# Patient Record
Sex: Female | Born: 1995 | Race: Black or African American | Hispanic: No | Marital: Single | Smoking: Never smoker
Health system: Southern US, Community
[De-identification: ages and names within clinical notes are randomized; demographics above are authoritative.]

## PROBLEM LIST (undated history)

## (undated) DIAGNOSIS — T7840XA Allergy, unspecified, initial encounter: Secondary | ICD-10-CM

## (undated) DIAGNOSIS — I10 Essential (primary) hypertension: Secondary | ICD-10-CM

## (undated) DIAGNOSIS — F419 Anxiety disorder, unspecified: Secondary | ICD-10-CM

## (undated) DIAGNOSIS — J40 Bronchitis, not specified as acute or chronic: Secondary | ICD-10-CM

## (undated) DIAGNOSIS — K219 Gastro-esophageal reflux disease without esophagitis: Secondary | ICD-10-CM

## (undated) HISTORY — DX: Gastro-esophageal reflux disease without esophagitis: K21.9

## (undated) HISTORY — PX: OTHER SURGICAL HISTORY: SHX169

---
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
## 2013-01-04 ENCOUNTER — Other Ambulatory Visit: Payer: Self-pay

## 2013-01-04 MED ORDER — ALBUTEROL SULFATE HFA 108 (90 BASE) MCG/ACT IN AERS: 2.0000 | INHALATION_SPRAY | Freq: Four times a day (QID) | RESPIRATORY_TRACT | Status: DC | PRN

## 2013-02-03 ENCOUNTER — Telehealth: Payer: Self-pay | Admitting: General Practice

## 2013-02-03 NOTE — Telephone Encounter (Signed)
Pt wants her referral to ortho changed to an [place] office. It is too far for her to go to [facility].

## 2013-02-07 ENCOUNTER — Telehealth: Payer: Self-pay | Admitting: General Practice

## 2013-02-14 NOTE — Telephone Encounter (Signed)
Needs a referral put in workqueue

## 2013-02-15 ENCOUNTER — Other Ambulatory Visit: Payer: Self-pay | Admitting: General Practice

## 2013-02-16 NOTE — Telephone Encounter (Signed)
Spoke with patient and she reports being seen at the foot clinic. No needs at this time.

## 2013-11-13 ENCOUNTER — Other Ambulatory Visit: Payer: Self-pay

## 2013-11-16 ENCOUNTER — Other Ambulatory Visit: Payer: Self-pay

## 2014-02-20 ENCOUNTER — Other Ambulatory Visit: Payer: Self-pay | Admitting: General Practice

## 2014-02-21 NOTE — Telephone Encounter (Signed)
Patient of Amy Nichols. Last OV 5-14. Please advise on refill

## 2014-02-22 NOTE — Telephone Encounter (Signed)
Refill denied- told at last visit [facility]

## 2014-02-23 ENCOUNTER — Ambulatory Visit: Payer: Medicaid Other | Admitting: Nurse Practitioner

## 2014-06-14 ENCOUNTER — Ambulatory Visit (INDEPENDENT_AMBULATORY_CARE_PROVIDER_SITE_OTHER): Payer: 59 | Admitting: Family Medicine

## 2014-06-14 ENCOUNTER — Encounter: Payer: Self-pay | Admitting: Family Medicine

## 2014-06-14 VITALS — BP 118/71 | HR 83 | Temp 97.7°F | Ht 66.0 in | Wt 260.0 lb

## 2014-06-14 DIAGNOSIS — R51 Headache: Secondary | ICD-10-CM

## 2014-06-14 DIAGNOSIS — Z0000 Encounter for general adult medical examination without abnormal findings: Secondary | ICD-10-CM

## 2014-06-14 DIAGNOSIS — G4730 Sleep apnea, unspecified: Secondary | ICD-10-CM

## 2014-06-14 DIAGNOSIS — L700 Acne vulgaris: Secondary | ICD-10-CM

## 2014-06-14 DIAGNOSIS — R519 Headache, unspecified: Secondary | ICD-10-CM

## 2014-06-14 DIAGNOSIS — G478 Other sleep disorders: Secondary | ICD-10-CM

## 2014-06-14 LAB — POCT UA - MICROSCOPIC ONLY
Casts, Ur, LPF, POC: NEGATIVE
Crystals, Ur, HPF, POC: NEGATIVE
MUCUS UA: NEGATIVE
RBC, URINE, MICROSCOPIC: NEGATIVE
Yeast, UA: NEGATIVE

## 2014-06-14 LAB — POCT URINALYSIS DIPSTICK
Bilirubin, UA: NEGATIVE
Blood, UA: NEGATIVE
GLUCOSE UA: NEGATIVE
KETONES UA: NEGATIVE
Nitrite, UA: NEGATIVE
Protein, UA: NEGATIVE
Spec Grav, UA: 1.02
Urobilinogen, UA: NEGATIVE
pH, UA: 6.5

## 2014-06-14 LAB — POCT CBC
Granulocyte percent: 62.8 %G (ref 37–80)
HCT, POC: 41.3 % (ref 37.7–47.9)
Hemoglobin: 12.5 g/dL (ref 12.2–16.2)
LYMPH, POC: 2.4 (ref 0.6–3.4)
MCH, POC: 26.1 pg — AB (ref 27–31.2)
MCHC: 30.2 g/dL — AB (ref 31.8–35.4)
MCV: 86.5 fL (ref 80–97)
MPV: 9.4 fL (ref 0–99.8)
POC GRANULOCYTE: 4.8 (ref 2–6.9)
POC LYMPH PERCENT: 31.9 %L (ref 10–50)
Platelet Count, POC: 137 10*3/uL — AB (ref 142–424)
RBC: 4.8 M/uL (ref 4.04–5.48)
RDW, POC: 13.5 %
WBC: 7.6 10*3/uL (ref 4.6–10.2)

## 2014-06-14 MED ORDER — ADAPALENE-BENZOYL PEROXIDE 0.1-2.5 % EX GEL: CUTANEOUS | Status: DC

## 2014-06-14 NOTE — Progress Notes (Signed)
Subjective:    Patient ID: Amy Nichols, female    DOB: [birth year], 19 y.o.   MRN: [id number]  Headache  This is a recurrent problem. The current episode started more than 1 year ago. The problem occurs daily. The problem has been waxing and waning. The pain is located in the bilateral, frontal and temporal region. The pain does not radiate. The quality of the pain is described as throbbing. The pain is at a severity of 8/10. Associated symptoms include phonophobia and photophobia. Pertinent negatives include no abdominal pain, coughing, dizziness, ear pain, eye pain, fever, hearing loss, nausea, numbness, rhinorrhea, sore throat, vomiting or weakness. She has tried acetaminophen and NSAIDs for the symptoms. The treatment provided significant relief. Her past medical history is significant for sinus disease.    Patient is here today for a check up and follow up of GERD.   Patient in for follow-up of GERD. He is currently asymptomatic taking his PPI daily. There is no chest pain or heartburn. He is taking the medication regularly. No dysphagia or choking.   Patient also is concerned about facial rash present for about 6 months. She was given an antifungal cream and mupirocin several months ago and says it temporarily cleared but came back and seems to be getting worse.     No Known Allergies  Outpatient Encounter Prescriptions as of 06/14/2014  Medication Sig  . omeprazole (PRILOSEC) 20 MG capsule TAKE (1) CAPSULE DAILY  . triamcinolone (KENALOG) 0.025 % cream APPLY TO AFFECTED AREAS TWICE A DAY (Patient not taking: Reported on 06/14/2014)  . [DISCONTINUED] albuterol (PROVENTIL HFA;VENTOLIN HFA) 108 (90 BASE) MCG/ACT inhaler Inhale 2 puffs into the lungs every 6 (six) hours as needed for wheezing. (Patient not taking: Reported on 06/14/2014)  . [DISCONTINUED] CAMRESE 0.15-0.03 &0.01 MG tablet TAKE 1 TABLET DAILY (Patient not taking: Reported on 06/14/2014)  . [DISCONTINUED] cetirizine (ZYRTEC)  10 MG tablet Take 10 mg by mouth daily.    Past Medical History  Diagnosis Date  . GERD (gastroesophageal reflux disease)   . Allergy     No past surgical history on file.  History   Social History  . Marital Status: Single    Spouse Name: N/A    Number of Children: N/A  . Years of Education: N/A   Occupational History  . Not on file.   Social History Main Topics  . Smoking status: Never Smoker   . Smokeless tobacco: Not on file  . Alcohol Use: No  . Drug Use: No  . Sexual Activity: Not on file   Other Topics Concern  . Not on file   Social History Narrative  . No narrative on file     Review of Systems  Constitutional: Negative for fever, chills, diaphoresis, appetite change, fatigue and unexpected weight change.  HENT: Negative for congestion, ear pain, hearing loss, postnasal drip, rhinorrhea, sneezing, sore throat and trouble swallowing.   Eyes: Positive for photophobia. Negative for pain.  Respiratory: Negative for cough, chest tightness and shortness of breath.   Cardiovascular: Negative for chest pain and palpitations.  Gastrointestinal: Negative for nausea, vomiting, abdominal pain, diarrhea and constipation.  Genitourinary: Negative for dysuria, frequency and menstrual problem.  Musculoskeletal: Negative for joint swelling and arthralgias.  Neurological: Positive for headaches. Negative for dizziness, weakness and numbness.  Psychiatric/Behavioral: Negative for dysphoric mood and agitation.       Objective:   Physical Exam  Constitutional: She is oriented to person, place, and time. She appears well-developed  and well-nourished. No distress.  HENT:  Head: Normocephalic and atraumatic.  Right Ear: External ear normal.  Left Ear: External ear normal.  Nose: Nose normal.  Mouth/Throat: Oropharynx is clear and moist.  Eyes: Conjunctivae and EOM are normal. Pupils are equal, round, and reactive to light.  Neck: Normal range of motion. Neck supple. No  thyromegaly present.  Cardiovascular: Normal rate, regular rhythm and normal heart sounds.   No murmur heard. Pulmonary/Chest: Effort normal and breath sounds normal. No respiratory distress. She has no wheezes. She has no rales.  Abdominal: Soft. Bowel sounds are normal. She exhibits no distension. There is no tenderness.  Lymphadenopathy:    She has no cervical adenopathy.  Neurological: She is alert and oriented to person, place, and time. She has normal reflexes.  Skin: Skin is warm and dry.  Papulopustular acne on bilateral cheeks extending to the temples and forehead and the bridge of the nose.  Psychiatric: She has a normal mood and affect. Her behavior is normal. Judgment and thought content normal.   BP 118/71 mmHg  Pulse 83  Temp(Src) 97.7 F (36.5 C) (Oral)  Ht _0  (1.676 m)  Wt 260 lb (117.935 kg)  BMI 41.99 kg/m2  LMP 05/16/2014        Assessment & Plan:   1. Headache disorder   2. Acne vulgaris   3. Sleep disorder breathing   4. Encounter for health maintenance examination     Meds ordered this encounter  Medications  . Adapalene-Benzoyl Peroxide (EPIDUO) 0.1-2.5 % gel    Sig: Apply to affected areas of acne daily    Dispense:  45 g    Refill:  11    Orders Placed This Encounter  Procedures  . MR Brain Wo Contrast    Standing Status: Future     Number of Occurrences:      Standing Expiration Date: 08/13/2015    Order Specific Question:  Reason for Exam (SYMPTOM  OR DIAGNOSIS REQUIRED)    Answer:  headache, 8/10 daily, increasing    Order Specific Question:  Preferred imaging location?    Answer:  External    Order Specific Question:  Does the patient have a pacemaker or implanted devices?    Answer:  No    Order Specific Question:  What is the patient's sedation requirement?    Answer:  No Sedation  . CMP14+EGFR  . Lipid panel  . TSH  . POCT CBC  . POCT urinalysis dipstick  . POCT UA - Microscopic Only  . Nocturnal polysomnography (NPSG)     Standing Status: Future     Number of Occurrences:      Standing Expiration Date: 06/15/2015    Order Specific Question:  Where should this test be performed:    Answer:  Other    Labs pending Health Maintenance reviewed Diet and exercise encouraged Continue all meds as discussed Follow up in 1 mo  Claretta Fraise, MD

## 2014-06-15 ENCOUNTER — Other Ambulatory Visit: Payer: Self-pay | Admitting: Nurse Practitioner

## 2014-06-15 ENCOUNTER — Other Ambulatory Visit: Payer: Self-pay | Admitting: Family Medicine

## 2014-06-15 DIAGNOSIS — L700 Acne vulgaris: Secondary | ICD-10-CM

## 2014-06-15 LAB — LIPID PANEL
Chol/HDL Ratio: 3.1 ratio units (ref 0.0–4.4)
Cholesterol, Total: 169 mg/dL (ref 100–199)
HDL: 54 mg/dL (ref >39)
LDL Calculated: 103 mg/dL — ABNORMAL HIGH (ref 0–99)
TRIGLYCERIDES: 59 mg/dL (ref 0–149)
VLDL Cholesterol Cal: 12 mg/dL (ref 5–40)

## 2014-06-15 LAB — CMP14+EGFR
ALBUMIN: 4.3 g/dL (ref 3.5–5.5)
ALT: 17 IU/L (ref 0–32)
AST: 24 IU/L (ref 0–40)
Albumin/Globulin Ratio: 1.2 (ref 1.1–2.5)
Alkaline Phosphatase: 63 IU/L (ref 39–117)
BUN / CREAT RATIO: 14 (ref 8–20)
BUN: 9 mg/dL (ref 6–20)
CALCIUM: 9.3 mg/dL (ref 8.7–10.2)
CO2: 23 mmol/L (ref 18–29)
Chloride: 103 mmol/L (ref 97–108)
Creatinine, Ser: 0.66 mg/dL (ref 0.57–1.00)
GFR calc Af Amer: 146 mL/min/{1.73_m2} (ref >59)
GFR, EST NON AFRICAN AMERICAN: 127 mL/min/{1.73_m2} (ref >59)
GLOBULIN, TOTAL: 3.5 g/dL (ref 1.5–4.5)
Glucose: 81 mg/dL (ref 65–99)
POTASSIUM: 4 mmol/L (ref 3.5–5.2)
Sodium: 141 mmol/L (ref 134–144)
Total Bilirubin: 1.2 mg/dL (ref 0.0–1.2)
Total Protein: 7.8 g/dL (ref 6.0–8.5)

## 2014-06-15 LAB — TSH: TSH: 1.25 u[IU]/mL (ref 0.450–4.500)

## 2014-06-15 MED ORDER — ADAPALENE-BENZOYL PEROXIDE 0.1-2.5 % EX GEL: CUTANEOUS | Status: DC

## 2014-06-15 MED ORDER — OMEPRAZOLE 20 MG PO CPDR: 20.0000 mg | DELAYED_RELEASE_CAPSULE | Freq: Every day | ORAL | Status: DC

## 2014-06-15 NOTE — Telephone Encounter (Signed)
Pt states Dr.Stacks was going to send in a nasal spray RX for her, didn't see any mention in the visit notes regarding this. Please advise.

## 2014-06-18 ENCOUNTER — Encounter: Payer: Self-pay | Admitting: Nurse Practitioner

## 2014-06-18 ENCOUNTER — Encounter: Payer: Self-pay | Admitting: Family Medicine

## 2014-06-18 MED ORDER — OMEPRAZOLE 20 MG PO CPDR: 20.0000 mg | DELAYED_RELEASE_CAPSULE | Freq: Every day | ORAL | Status: DC

## 2014-06-18 MED ORDER — MOMETASONE FUROATE 50 MCG/ACT NA SUSP: 2.0000 | Freq: Every day | NASAL | Status: DC

## 2014-06-18 NOTE — Telephone Encounter (Signed)
Medication sent to pharmacy  

## 2014-06-18 NOTE — Telephone Encounter (Signed)
Send a scrip for nasonex 2 spray q day each nostril. 1 bottle with 11 refill

## 2014-06-19 ENCOUNTER — Other Ambulatory Visit: Payer: Self-pay | Admitting: *Deleted

## 2014-06-19 DIAGNOSIS — G4730 Sleep apnea, unspecified: Secondary | ICD-10-CM

## 2014-06-20 ENCOUNTER — Telehealth: Payer: Self-pay | Admitting: *Deleted

## 2014-06-20 NOTE — Telephone Encounter (Signed)
[facility] azelex cream, benzaclin gel/ gel pump,clindamycin phosphate gel, pledgets or solution, differin cream,or tretinoin cream/gel.  I can't see where she has tried any of these documented in epic.  Will one of these work?  Thanks

## 2014-06-21 MED ORDER — CLINDAMYCIN PHOSPHATE 1 % EX GEL: Freq: Two times a day (BID) | CUTANEOUS | Status: DC

## 2014-06-21 NOTE — Telephone Encounter (Signed)
I sent in a prescription for clindamycin gel that she should apply twice daily. Thanks, WS.

## 2014-06-27 ENCOUNTER — Telehealth: Payer: Self-pay | Admitting: *Deleted

## 2014-06-27 ENCOUNTER — Other Ambulatory Visit (HOSPITAL_COMMUNITY): Payer: Self-pay | Admitting: Radiology

## 2014-06-27 DIAGNOSIS — G4730 Sleep apnea, unspecified: Secondary | ICD-10-CM

## 2014-06-27 MED ORDER — ADAPALENE 0.1 % EX GEL: Freq: Every day | CUTANEOUS | Status: DC

## 2014-06-27 MED ORDER — FLUTICASONE PROPIONATE 50 MCG/ACT NA SUSP: 2.0000 | Freq: Every day | NASAL | Status: DC

## 2014-06-27 NOTE — Telephone Encounter (Signed)
Differin and fluticasone sent to the pharmacy. please let the patient now

## 2014-06-27 NOTE — Telephone Encounter (Signed)
Call from [facility] wanting clarification on ordered sleep study Split study ordered in epic per Dr Darlyn ReadStacks

## 2014-06-27 NOTE — Telephone Encounter (Signed)
DR. Darlyn ReadStacks ins will only cover differen gel or benzaclin gel instead of clindamycin or adapalene/benzoin peroxide. Can you fix this? Thanks Also she need to try generic flonase instead of nasonex, can you fix that?

## 2014-06-28 NOTE — Telephone Encounter (Signed)
Patients mother notified that meds were sent to pharmacy

## 2014-07-06 ENCOUNTER — Ambulatory Visit: Payer: 59 | Attending: Family Medicine | Admitting: Sleep Medicine

## 2014-07-06 DIAGNOSIS — R0683 Snoring: Secondary | ICD-10-CM | POA: Diagnosis not present

## 2014-07-06 DIAGNOSIS — G4730 Sleep apnea, unspecified: Secondary | ICD-10-CM

## 2014-07-06 DIAGNOSIS — G4710 Hypersomnia, unspecified: Secondary | ICD-10-CM | POA: Insufficient documentation

## 2014-07-07 NOTE — Sleep Study (Signed)
  [facility] Ezana Hubbert A. Gerilyn Pilgrimoonquah, MD     [email or web address]        NOCTURNAL POLYSOMNOGRAM    LOCATION: SLEEP LAB FACILITY: [facility]   PHYSICIAN: Curvin Hunger A. Gerilyn Pilgrimoonquah, M.D.   DATE OF STUDY: 07/06/2014.   REFERRING PHYSICIAN: Mechele ClaudeWarren Stacks.   INDICATIONS: This is a 19 year old who presents with hypersomnia, loud snoring and witnessed apneas.  MEDICATIONS:  Prior to Admission medications   Medication Sig Start Date End Date Taking? Authorizing Provider  adapalene (DIFFERIN) 0.1 % gel Apply topically at bedtime. 06/27/14   Mechele ClaudeWarren Stacks, MD  clindamycin (CLINDAGEL) 1 % gel Apply topically 2 (two) times daily. 06/21/14   Mechele ClaudeWarren Stacks, MD  fluticasone (FLONASE) 50 MCG/ACT nasal spray Place 2 sprays into both nostrils daily. 06/27/14   Mechele ClaudeWarren Stacks, MD  mometasone (NASONEX) 50 MCG/ACT nasal spray Place 2 sprays into the nose daily. 06/18/14   Mechele ClaudeWarren Stacks, MD  omeprazole (PRILOSEC) 20 MG capsule Take 1 capsule (20 mg total) by mouth daily. 06/18/14   Mechele ClaudeWarren Stacks, MD  triamcinolone (KENALOG) 0.025 % cream APPLY TO AFFECTED AREAS TWICE A DAY Patient not taking: Reported on 06/14/2014 11/08/12   Coralie KeensMae E Haliburton, FNP      EPWORTH SLEEPINESS SCALE: 15.   BMI: 42.   ARCHITECTURAL SUMMARY: Total recording time was 415 minutes. Sleep efficiency 77 %. Sleep latency 12 minutes. REM latency 44 minutes. Stage NI 6 %, N2 45 % and N3 32 % and REM sleep 17 %.    RESPIRATORY DATA:  Baseline oxygen saturation is 98 %. The lowest saturation is 88 %. The diagnostic AHI is 4. The RDI is 10. The REM AHI is 5.  LIMB MOVEMENT SUMMARY: PLM index 1.   ELECTROCARDIOGRAM SUMMARY: Average heart rate is 95 with no significant dysrhythmias observed.   IMPRESSION:  1. 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 Early REM latency can be seen in REM rebound phenomena or narcolepsy. Given the patient's age and hypersomnia, sleep consultation is suggested.  Thanks for this referral.  Rachel Rison A.  Gerilyn Pilgrimoonquah, M.D. Diplomat, Biomedical engineerAmerican Board of Sleep Medicine.

## 2014-07-16 ENCOUNTER — Ambulatory Visit: Payer: Self-pay | Admitting: Family Medicine

## 2014-07-20 ENCOUNTER — Telehealth: Payer: Self-pay | Admitting: Family Medicine

## 2014-07-20 NOTE — Telephone Encounter (Signed)
Please check on the status of her MRI of the brain. If it is not ordered please order or let me know and I will. Just a noncontrast for headache

## 2014-07-20 NOTE — Telephone Encounter (Signed)
The sleep study was normal

## 2014-07-20 NOTE — Telephone Encounter (Signed)
Please review sleep study results and route back to pools. Do you know the status on the MRI?

## 2014-07-23 NOTE — Telephone Encounter (Signed)
Pt aware sleep study was normal.

## 2014-07-25 ENCOUNTER — Encounter: Payer: Self-pay | Admitting: Family Medicine

## 2014-08-17 ENCOUNTER — Ambulatory Visit: Payer: Self-pay | Admitting: Family Medicine

## 2014-08-23 ENCOUNTER — Encounter: Payer: Self-pay | Admitting: Family Medicine

## 2014-08-24 NOTE — Telephone Encounter (Signed)
Please work with the patient to arrange her MRI

## 2014-08-25 ENCOUNTER — Telehealth: Payer: 59 | Admitting: Family

## 2014-08-25 DIAGNOSIS — R51 Headache: Principal | ICD-10-CM

## 2014-08-25 DIAGNOSIS — R519 Headache, unspecified: Secondary | ICD-10-CM

## 2014-08-25 NOTE — Progress Notes (Signed)
Based on what you shared with me it looks like you have a serious condition that should be evaluated in a face to face office visit.  You may also benefit from starting an over-the counter-allergy medication such as Claritin 10 mg or Zyrtec 10 mg daily.   If you are having a true medical emergency please call 911.  If you need an urgent face to face visit, [facility] has four urgent care centers for your convenience.  Tressie Ellis. [facility]  [phone] Get Driving Directions Find a Provider at this Location  [address] [place], [place] [address] . 8 am to 8 pm Monday-Friday . 9 am to 7 pm Saturday-Sunday  . [facility]  [phone] Get Driving Directions Find a Provider at this Location  [address] [place], [place] [address] . 8 am to 8 pm Monday-Friday . 9 am to 6 pm Saturday . 11 am to 6 pm Sunday   . [facility] [place]  [phone] Get Driving Directions  [address] [place], [place] [address] . 8 am to 8 pm Monday-Friday . 9 am to 4 pm Saturday-Sunday   . [facility] (a walk in primary care provider)  [phone]  Get Driving Directions Find a Provider at this Location  [address] [place], [place] [address] . 8 am to 8:30 pm Monday-Thursday . 8 am to 6 pm Friday . 8 am to 4 pm Saturday-Sunday   Your e-visit answers were reviewed by a board certified advanced clinical practitioner to complete your personal care plan.  Depending on the condition, your plan could have included both over the counter or prescription medications.  You will get an e-mail in the next two days asking about your experience.  I hope that your e-visit has been valuable and will speed your recovery . Thank you for choosing an e-visit.

## 2014-08-27 ENCOUNTER — Other Ambulatory Visit: Payer: Self-pay

## 2014-08-27 DIAGNOSIS — G4485 Primary stabbing headache: Secondary | ICD-10-CM

## 2014-08-27 DIAGNOSIS — G8929 Other chronic pain: Secondary | ICD-10-CM

## 2014-08-27 DIAGNOSIS — R51 Headache: Principal | ICD-10-CM

## 2014-09-07 ENCOUNTER — Ambulatory Visit (HOSPITAL_COMMUNITY)
Admission: RE | Admit: 2014-09-07 | Discharge: 2014-09-07 | Disposition: A | Discharge status: Home / Self Care | Payer: 59 | Source: Ambulatory Visit | Attending: Family Medicine | Admitting: Family Medicine

## 2014-09-07 DIAGNOSIS — G4485 Primary stabbing headache: Secondary | ICD-10-CM | POA: Diagnosis not present

## 2014-09-28 ENCOUNTER — Telehealth: Payer: Self-pay | Admitting: Family Medicine

## 2014-09-28 DIAGNOSIS — M7731 Calcaneal spur, right foot: Secondary | ICD-10-CM

## 2014-10-01 NOTE — Telephone Encounter (Signed)
Referral made 

## 2014-10-01 NOTE — Telephone Encounter (Signed)
Pt notified of referral appt is already scheduled

## 2014-11-28 ENCOUNTER — Encounter: Payer: Self-pay | Admitting: Family Medicine

## 2014-11-28 ENCOUNTER — Ambulatory Visit (INDEPENDENT_AMBULATORY_CARE_PROVIDER_SITE_OTHER): Payer: 59 | Admitting: Family Medicine

## 2014-11-28 VITALS — BP 135/83 | HR 71 | Temp 97.2°F | Ht 69.0 in | Wt 267.2 lb

## 2014-11-28 DIAGNOSIS — J351 Hypertrophy of tonsils: Secondary | ICD-10-CM | POA: Diagnosis not present

## 2014-11-28 DIAGNOSIS — R0683 Snoring: Secondary | ICD-10-CM | POA: Diagnosis not present

## 2014-11-28 MED ORDER — PSEUDOEPHEDRINE-GUAIFENESIN ER 120-1200 MG PO TB12: 1.0000 | ORAL_TABLET | Freq: Two times a day (BID) | ORAL | Status: DC

## 2014-11-28 MED ORDER — BETAMETHASONE SOD PHOS & ACET 6 (3-3) MG/ML IJ SUSP
6.0000 mg | Freq: Once | INTRAMUSCULAR | Status: AC
  Administered 2014-11-28: 6 mg via INTRAMUSCULAR

## 2014-11-28 NOTE — Progress Notes (Signed)
Subjective:  Patient ID: Amy Nichols Grecco, female    DOB: 10-May-1996  Age: 19 y.o. MRN: [id number]  CC: Follow-up   HPI Amy Nichols Lorenzetti presents for snoring, but apnea test neg. HA has resolved. Last HA 6 weeks ago. She is adequately treated for her reflux with omeprazole. She does not need any medication for the headache anymore. She does continue to snore. Review of her sleep apnea test  with her. Snoring is very loud. It lasts most of the night. Her mom can hear from the next room. She has a history of frequent strep infections. These extend from childhood to the present. She would like to have some relief from the snoring. She relates that her sleep is frequently non-restorative  History Sheppard EvensKadedra has a past medical history of GERD (gastroesophageal reflux disease) and Allergy.   She has no past surgical history on file.   Her family history includes Asthma in her father.She reports that she has never smoked. She does not have any smokeless tobacco history on file. She reports that she does not drink alcohol or use illicit drugs.  Outpatient Prescriptions Prior to Visit  Medication Sig Dispense Refill  . adapalene (DIFFERIN) 0.1 % gel Apply topically at bedtime. 45 g 11  . fluticasone (FLONASE) 50 MCG/ACT nasal spray Place 2 sprays into both nostrils daily. 16 g 6  . omeprazole (PRILOSEC) 20 MG capsule Take 1 capsule (20 mg total) by mouth daily. 30 capsule 11  . clindamycin (CLINDAGEL) 1 % gel Apply topically 2 (two) times daily. 30 g 11  . mometasone (NASONEX) 50 MCG/ACT nasal spray Place 2 sprays into the nose daily. 17 g 11  . triamcinolone (KENALOG) 0.025 % cream APPLY TO AFFECTED AREAS TWICE A DAY (Patient not taking: Reported on 06/14/2014) 30 g 0   No facility-administered medications prior to visit.    ROS Review of Systems  Constitutional: Negative for fever, chills, diaphoresis, appetite change, fatigue and unexpected weight change.  HENT: Negative for congestion, ear pain,  hearing loss, postnasal drip, rhinorrhea, sneezing, sore throat and trouble swallowing.   Eyes: Negative for pain.  Respiratory: Negative for cough, chest tightness and shortness of breath.   Cardiovascular: Negative for chest pain and palpitations.  Gastrointestinal: Negative for nausea, vomiting, abdominal pain, diarrhea and constipation.  Genitourinary: Negative for dysuria, frequency and menstrual problem.  Musculoskeletal: Negative for joint swelling and arthralgias.  Skin: Negative for rash.  Neurological: Negative for dizziness, weakness, numbness and headaches.  Psychiatric/Behavioral: Negative for dysphoric mood and agitation.    Objective:  BP 135/83 mmHg  Pulse 71  Temp(Src) 97.2 F (36.2 C) (Oral)  Ht 5\' 9"  (1.753 m)  Wt 267 lb 3.2 oz (121.201 kg)  BMI 39.44 kg/m2  LMP 11/21/2014  BP Readings from Last 3 Encounters:  11/28/14 135/83  06/14/14 118/71  09/15/12 125/82    Wt Readings from Last 3 Encounters:  11/28/14 267 lb 3.2 oz (121.201 kg)  09/07/14 245 lb (111.131 kg)  07/06/14 260 lb (117.935 kg)     Physical Exam  Constitutional: She appears well-developed and well-nourished.  HENT:  Head: Normocephalic and atraumatic.  Right Ear: Tympanic membrane and external ear normal. No decreased hearing is noted.  Left Ear: Tympanic membrane and external ear normal. No decreased hearing is noted.  Nose: Mucosal edema present. Right sinus exhibits no frontal sinus tenderness. Left sinus exhibits no frontal sinus tenderness.  Mouth/Throat: No oropharyngeal exudate or posterior oropharyngeal erythema.  Tonsils are 3+ enlarged and cryptic without  erythema. Nasal passages are swollen and erythematous  Neck: Normal range of motion. Neck supple. No Brudzinski's sign noted. No thyromegaly present.  Cardiovascular: Normal rate and regular rhythm.   No murmur heard. Pulmonary/Chest: Breath sounds normal. No respiratory distress. She has no wheezes. She has no rales.    Lymphadenopathy:       Head (right side): No preauricular adenopathy present.       Head (left side): No preauricular adenopathy present.    She has cervical adenopathy (shotty).       Right cervical: No superficial cervical adenopathy present.      Left cervical: No superficial cervical adenopathy present.    No results found for: HGBA1C  Lab Results  Component Value Date   WBC 7.6 06/14/2014   HGB 12.5 06/14/2014   HCT 41.3 06/14/2014   GLUCOSE 81 06/14/2014   CHOL 169 06/14/2014   TRIG 59 06/14/2014   HDL 54 06/14/2014   LDLCALC 103* 06/14/2014   ALT 17 06/14/2014   AST 24 06/14/2014   NA 141 06/14/2014   K 4.0 06/14/2014   CL 103 06/14/2014   CREATININE 0.66 06/14/2014   BUN 9 06/14/2014   CO2 23 06/14/2014   TSH 1.250 06/14/2014    Mr Brain Wo Contrast  09/07/2014   CLINICAL DATA:  Severe headache, 3-4 months duration.  EXAM: MRI HEAD WITHOUT CONTRAST  TECHNIQUE: Multiplanar, multiecho pulse sequences of the brain and surrounding structures were obtained without intravenous contrast.  COMPARISON:  None.  FINDINGS: The brain has a normal appearance on all pulse sequences without evidence of malformation, atrophy, old or acute infarction, mass lesion, hemorrhage, hydrocephalus or extra-axial collection. No pituitary mass. No fluid in the sinuses, middle ears or mastoids. No skull or skullbase lesion. There is flow in the major vessels at the base of the brain. Major venous sinuses show flow.  IMPRESSION: Normal examination.  No cause of headache identified.   Electronically Signed   By: Paulina Fusi M.D.   On: 09/07/2014 20:04    Assessment & Plan:   Lateria was seen today for follow-up.  Diagnoses and all orders for this visit:  Snoring Orders: -     Ambulatory referral to ENT  Swollen tonsil Orders: -     Ambulatory referral to ENT -     betamethasone acetate-betamethasone sodium phosphate (CELESTONE) injection 6 mg; Inject 1 mL (6 mg total) into the muscle  once.  Other orders -     Pseudoephedrine-Guaifenesin [phone] MG TB12; Take 1 tablet by mouth 2 (two) times daily. For congeestion   I have discontinued Ms. Mcwethy's triamcinolone, mometasone, and clindamycin. I am also having her start on Pseudoephedrine-Guaifenesin. Additionally, I am having her maintain her omeprazole, fluticasone, and adapalene. We administered betamethasone acetate-betamethasone sodium phosphate.  Meds ordered this encounter  Medications  . Pseudoephedrine-Guaifenesin [phone] MG TB12    Sig: Take 1 tablet by mouth 2 (two) times daily. For congeestion    Dispense:  20 each    Refill:  0  . betamethasone acetate-betamethasone sodium phosphate (CELESTONE) injection 6 mg    Sig:      Follow-up: Return if symptoms worsen or fail to improve.  Mechele Claude, M.D.

## 2014-12-20 ENCOUNTER — Telehealth: Payer: Self-pay | Admitting: Family Medicine

## 2014-12-20 NOTE — Telephone Encounter (Signed)
Pt given authorization and is rescheduled for 01/07/2015 at 3:30 with Dr. Suszanne Conners

## 2015-01-25 ENCOUNTER — Ambulatory Visit (INDEPENDENT_AMBULATORY_CARE_PROVIDER_SITE_OTHER): Payer: 59 | Admitting: Family Medicine

## 2015-01-25 ENCOUNTER — Encounter: Payer: Self-pay | Admitting: Family Medicine

## 2015-01-25 VITALS — BP 122/75 | HR 88 | Temp 97.6°F | Ht 69.0 in | Wt 272.4 lb

## 2015-01-25 DIAGNOSIS — N3090 Cystitis, unspecified without hematuria: Secondary | ICD-10-CM

## 2015-01-25 DIAGNOSIS — R3915 Urgency of urination: Secondary | ICD-10-CM | POA: Diagnosis not present

## 2015-01-25 LAB — POCT UA - MICROSCOPIC ONLY
Bacteria, U Microscopic: NEGATIVE
CASTS, UR, LPF, POC: NEGATIVE
Crystals, Ur, HPF, POC: NEGATIVE
MUCUS UA: NEGATIVE
WBC, Ur, HPF, POC: 25.3
Yeast, UA: NEGATIVE

## 2015-01-25 LAB — POCT URINALYSIS DIPSTICK
Bilirubin, UA: NEGATIVE
Glucose, UA: NEGATIVE
Ketones, UA: NEGATIVE
Nitrite, UA: NEGATIVE
PH UA: 8
Protein, UA: NEGATIVE
Spec Grav, UA: 1.01
Urobilinogen, UA: NEGATIVE

## 2015-01-25 MED ORDER — NITROFURANTOIN MACROCRYSTAL 100 MG PO CAPS: 100.0000 mg | ORAL_CAPSULE | Freq: Two times a day (BID) | ORAL | Status: DC

## 2015-01-25 NOTE — Addendum Note (Signed)
Addended by: Mechele Claude on: 01/25/2015 08:14 PM   Modules accepted: Kipp Brood

## 2015-01-25 NOTE — Progress Notes (Signed)
Subjective:  Patient ID: Amy Nichols, female    DOB: 20-May-1995  Age: 19 y.o. MRN: [id number]  CC: Urinary Tract Infection   HPI Amy Nichols presents for frequency, urgency. No pain. Onset 3 or 4 days ago. Gradually increasing discomfort. No nausea vomiting or diarrhea. No flank pain no fever chills or sweats.  History Amy Nichols has a past medical history of GERD (gastroesophageal reflux disease) and Allergy.   Amy Nichols has no past surgical history on file.   Amy Nichols family history includes Asthma in Amy Nichols father.Amy Nichols reports that Amy Nichols has never smoked. Amy Nichols does not have any smokeless tobacco history on file. Amy Nichols reports that Amy Nichols does not drink alcohol or use illicit drugs.  Outpatient Prescriptions Prior to Visit  Medication Sig Dispense Refill  . adapalene (DIFFERIN) 0.1 % gel Apply topically at bedtime. 45 g 11  . fluticasone (FLONASE) 50 MCG/ACT nasal spray Place 2 sprays into both nostrils daily. 16 g 6  . omeprazole (PRILOSEC) 20 MG capsule Take 1 capsule (20 mg total) by mouth daily. 30 capsule 11  . Pseudoephedrine-Guaifenesin [id number] MG TB12 Take 1 tablet by mouth 2 (two) times daily. For congeestion (Patient not taking: Reported on 01/25/2015) 20 each 0   No facility-administered medications prior to visit.    ROS Review of Systems  Constitutional: Negative for fever, chills and diaphoresis.  HENT: Negative for congestion.   Eyes: Negative for visual disturbance.  Respiratory: Negative for cough and shortness of breath.   Cardiovascular: Negative for chest pain and palpitations.  Gastrointestinal: Negative for nausea, diarrhea and constipation.  Genitourinary: Positive for dysuria, urgency and frequency. Negative for hematuria, flank pain, decreased urine volume, menstrual problem and pelvic pain.  Musculoskeletal: Negative for joint swelling and arthralgias.  Skin: Negative for rash.  Neurological: Negative for dizziness and numbness.    Objective:  BP 122/75 mmHg  Pulse  88  Temp(Src) 97.6 F (36.4 C) (Oral)  Ht  (1.753 m)  Wt 272 lb 6.4 oz (123.56 kg)  BMI 40.21 kg/m2  LMP 01/14/2015  BP Readings from Last 3 Encounters:  01/25/15 122/75  11/28/14 135/83  06/14/14 118/71    Wt Readings from Last 3 Encounters:  01/25/15 272 lb 6.4 oz (123.56 kg)  11/28/14 267 lb 3.2 oz (121.201 kg)  09/07/14 245 lb (111.131 kg)     Physical Exam  Constitutional: Amy Nichols is oriented to person, place, and time. Amy Nichols appears well-developed and well-nourished.  HENT:  Head: Normocephalic and atraumatic.  Cardiovascular: Normal rate and regular rhythm.   No murmur heard. Pulmonary/Chest: Effort normal and breath sounds normal.  Abdominal: Soft. Bowel sounds are normal. Amy Nichols exhibits no mass. There is no tenderness. There is no rebound and no guarding.  Neurological: Amy Nichols is alert and oriented to person, place, and time.  Skin: Skin is warm and dry.  Psychiatric: Amy Nichols has a normal mood and affect. Amy Nichols behavior is normal.    No results found for: HGBA1C  Lab Results  Component Value Date   WBC 7.6 06/14/2014   HGB 12.5 06/14/2014   HCT 41.3 06/14/2014   GLUCOSE 81 06/14/2014   CHOL 169 06/14/2014   TRIG 59 06/14/2014   HDL 54 06/14/2014   LDLCALC 103* 06/14/2014   ALT 17 06/14/2014   AST 24 06/14/2014   NA 141 06/14/2014   K 4.0 06/14/2014   CL 103 06/14/2014   CREATININE 0.66 06/14/2014   BUN 9 06/14/2014   CO2 23 06/14/2014   TSH 1.250 06/14/2014  Mr Brain Wo Contrast  09/07/2014   CLINICAL DATA:  Severe headache, 3-4 months duration.  EXAM: MRI HEAD WITHOUT CONTRAST  TECHNIQUE: Multiplanar, multiecho pulse sequences of the brain and surrounding structures were obtained without intravenous contrast.  COMPARISON:  None.  FINDINGS: The brain has a normal appearance on all pulse sequences without evidence of malformation, atrophy, old or acute infarction, mass lesion, hemorrhage, hydrocephalus or extra-axial collection. No pituitary mass. No fluid in  the sinuses, middle ears or mastoids. No skull or skullbase lesion. There is flow in the major vessels at the base of the brain. Major venous sinuses show flow.  IMPRESSION: Normal examination.  No cause of headache identified.   Electronically Signed   By: Paulina Fusi M.D.   On: 09/07/2014 20:04    Assessment & Plan:   Amy Nichols was seen today for urinary tract infection.  Diagnoses and all orders for this visit:  Urgency of urination -     POCT urinalysis dipstick -     POCT UA - Microscopic Only -     Urine culture  Cystitis  Other orders -     nitrofurantoin (MACRODANTIN) 100 MG capsule; Take 1 capsule (100 mg total) by mouth 2 (two) times daily.   I am having Amy Nichols start on nitrofurantoin. I am also having Amy Nichols maintain Amy Nichols omeprazole, fluticasone, adapalene, and Pseudoephedrine-Guaifenesin.  Meds ordered this encounter  Medications  . nitrofurantoin (MACRODANTIN) 100 MG capsule    Sig: Take 1 capsule (100 mg total) by mouth 2 (two) times daily.    Dispense:  14 capsule    Refill:  0     Follow-up: Return if symptoms worsen or fail to improve.  Mechele Claude, M.D.

## 2015-01-27 LAB — URINE CULTURE

## 2015-01-28 ENCOUNTER — Other Ambulatory Visit: Payer: Self-pay | Admitting: Family Medicine

## 2015-01-28 MED ORDER — AMOXICILLIN 875 MG PO TABS: 875.0000 mg | ORAL_TABLET | Freq: Two times a day (BID) | ORAL | Status: DC

## 2015-01-29 NOTE — Progress Notes (Signed)
Patient aware.

## 2015-05-22 ENCOUNTER — Ambulatory Visit (INDEPENDENT_AMBULATORY_CARE_PROVIDER_SITE_OTHER): Payer: BLUE CROSS/BLUE SHIELD | Admitting: Family Medicine

## 2015-05-22 ENCOUNTER — Encounter: Payer: Self-pay | Admitting: Family Medicine

## 2015-05-22 VITALS — BP 132/87 | HR 105 | Temp 97.9°F | Ht 69.0 in | Wt 275.2 lb

## 2015-05-22 DIAGNOSIS — H6501 Acute serous otitis media, right ear: Secondary | ICD-10-CM

## 2015-05-22 MED ORDER — AMOXICILLIN-POT CLAVULANATE 875-125 MG PO TABS: 1.0000 | ORAL_TABLET | Freq: Two times a day (BID) | ORAL | Status: DC

## 2015-05-22 NOTE — Progress Notes (Signed)
Subjective:  Patient ID: Amy Nichols, female    DOB: [birth year]  Age: 20 y.o. MRN: [id number]  CC: Otalgia   HPI Amy Nichols presents for pain in both ears. Mild decrease in hearing. No fever  History Amy Nichols has a past medical history of GERD (gastroesophageal reflux disease) and Allergy.   Amy Nichols has no past surgical history on file.   Amy Nichols family history includes Asthma in Amy Nichols father.Amy Nichols reports that Amy Nichols has never smoked. Amy Nichols does not have any smokeless tobacco history on file. Amy Nichols reports that Amy Nichols does not drink alcohol or use illicit drugs.  Outpatient Prescriptions Prior to Visit  Medication Sig Dispense Refill  . adapalene (DIFFERIN) 0.1 % gel Apply topically at bedtime. 45 g 11  . fluticasone (FLONASE) 50 MCG/ACT nasal spray Place 2 sprays into both nostrils daily. 16 g 6  . omeprazole (PRILOSEC) 20 MG capsule Take 1 capsule (20 mg total) by mouth daily. 30 capsule 11  . amoxicillin (AMOXIL) 875 MG tablet Take 1 tablet (875 mg total) by mouth 2 (two) times daily. 20 tablet 0  . nitrofurantoin (MACRODANTIN) 100 MG capsule Take 1 capsule (100 mg total) by mouth 2 (two) times daily. 14 capsule 0  . Pseudoephedrine-Guaifenesin [phone] MG TB12 Take 1 tablet by mouth 2 (two) times daily. For congeestion (Patient not taking: Reported on 01/25/2015) 20 each 0   No facility-administered medications prior to visit.    ROS Review of Systems  Constitutional: Negative for fever, chills, diaphoresis, appetite change and fatigue.  HENT: Positive for ear pain. Negative for congestion, ear discharge, hearing loss, postnasal drip, rhinorrhea, sore throat and trouble swallowing.   Respiratory: Negative for cough, chest tightness and shortness of breath.   Cardiovascular: Negative for chest pain and palpitations.  Gastrointestinal: Positive for abdominal pain. Negative for nausea.  Musculoskeletal: Negative for arthralgias.  Skin: Negative for rash.    Objective:  BP 132/87 mmHg   Pulse 105  Temp(Src) 97.9 F (36.6 C) (Oral)  Ht 5\' 9"  (1.753 m)  Wt 275 lb 3.2 oz (124.83 kg)  BMI 40.62 kg/m2  SpO2 99%  LMP 04/21/2015  BP Readings from Last 3 Encounters:  05/22/15 132/87  01/25/15 122/75  11/28/14 135/83    Wt Readings from Last 3 Encounters:  05/22/15 275 lb 3.2 oz (124.83 kg)  01/25/15 272 lb 6.4 oz (123.56 kg)  11/28/14 267 lb 3.2 oz (121.201 kg)     Physical Exam  Constitutional: Amy Nichols appears well-developed and well-nourished.  HENT:  Head: Normocephalic and atraumatic.  Right Ear: External ear normal. A middle ear effusion is present.  Left Ear: Tympanic membrane and external ear normal.  No middle ear effusion.  Nose: Mucosal edema present. Right sinus exhibits no frontal sinus tenderness. Left sinus exhibits no frontal sinus tenderness.  Mouth/Throat: No oropharyngeal exudate or posterior oropharyngeal erythema.  Neck: No Brudzinski's sign noted.  Pulmonary/Chest: Breath sounds normal. No respiratory distress.  Lymphadenopathy:       Head (right side): No preauricular adenopathy present.       Head (left side): No preauricular adenopathy present.       Right cervical: No superficial cervical adenopathy present.      Left cervical: No superficial cervical adenopathy present.     Lab Results  Component Value Date   WBC 7.6 06/14/2014   HGB 12.5 06/14/2014   HCT 41.3 06/14/2014   GLUCOSE 81 06/14/2014   CHOL 169 06/14/2014   TRIG 59 06/14/2014   HDL 54 06/14/2014  LDLCALC 103* 06/14/2014   ALT 17 06/14/2014   AST 24 06/14/2014   NA 141 06/14/2014   K 4.0 06/14/2014   CL 103 06/14/2014   CREATININE 0.66 06/14/2014   BUN 9 06/14/2014   CO2 23 06/14/2014   TSH 1.250 06/14/2014       Assessment & Plan:   Amy Nichols was seen today for otalgia.  Diagnoses and all orders for this visit:  Right acute serous otitis media, recurrence not specified  Other orders -     amoxicillin-clavulanate (AUGMENTIN) 875-125 MG tablet; Take 1  tablet by mouth 2 (two) times daily. Take all of this medication   I have discontinued Amy Nichols's Pseudoephedrine-Guaifenesin, nitrofurantoin, and amoxicillin. I am also having Amy Nichols start on amoxicillin-clavulanate. Additionally, I am having Amy Nichols maintain Amy Nichols omeprazole, fluticasone, and adapalene.  Meds ordered this encounter  Medications  . amoxicillin-clavulanate (AUGMENTIN) 875-125 MG tablet    Sig: Take 1 tablet by mouth 2 (two) times daily. Take all of this medication    Dispense:  20 tablet    Refill:  0     Follow-up: Return if symptoms worsen or fail to improve.  Mechele Claude, M.D.

## 2015-05-28 ENCOUNTER — Telehealth: Payer: Self-pay | Admitting: Family Medicine

## 2015-05-28 MED ORDER — FLUCONAZOLE 150 MG PO TABS: 150.0000 mg | ORAL_TABLET | Freq: Once | ORAL | Status: DC

## 2015-05-28 NOTE — Telephone Encounter (Signed)
Dunn, tell patient.

## 2015-05-28 NOTE — Telephone Encounter (Signed)
[email or web address]

## 2015-06-18 ENCOUNTER — Other Ambulatory Visit: Payer: Self-pay | Admitting: Family Medicine

## 2015-06-18 MED ORDER — FLUCONAZOLE 150 MG PO TABS: 150.0000 mg | ORAL_TABLET | Freq: Once | ORAL | Status: DC

## 2015-07-01 ENCOUNTER — Other Ambulatory Visit: Payer: Self-pay | Admitting: Family Medicine

## 2015-07-19 ENCOUNTER — Ambulatory Visit (INDEPENDENT_AMBULATORY_CARE_PROVIDER_SITE_OTHER): Payer: BLUE CROSS/BLUE SHIELD | Admitting: Nurse Practitioner

## 2015-07-19 ENCOUNTER — Encounter: Payer: Self-pay | Admitting: Nurse Practitioner

## 2015-07-19 VITALS — BP 107/79 | HR 96 | Temp 99.1°F | Ht 69.0 in | Wt 275.0 lb

## 2015-07-19 DIAGNOSIS — J029 Acute pharyngitis, unspecified: Secondary | ICD-10-CM | POA: Diagnosis not present

## 2015-07-19 LAB — RAPID STREP SCREEN (MED CTR MEBANE ONLY): Strep Gp A Ag, IA W/Reflex: NEGATIVE

## 2015-07-19 LAB — CULTURE, GROUP A STREP

## 2015-07-19 NOTE — Patient Instructions (Signed)

## 2015-07-19 NOTE — Progress Notes (Signed)
  Subjective:     Amy Nichols is a 20 y.o. female who presents for evaluation of sore throat. Associated symptoms include headache, hoarseness, nasal blockage, post nasal drip, sinus and nasal congestion and sore throat. Onset of symptoms was 2 days ago, and have been gradually worsening since that time. She is drinking plenty of fluids. She has had a recent close exposure to someone with proven streptococcal pharyngitis.  The following portions of the patient's history were reviewed and updated as appropriate: allergies, current medications, past family history, past medical history, past social history, past surgical history and problem list.  Review of Systems Pertinent items are noted in HPI.    Objective:    BP 107/79 mmHg  Pulse 96  Temp(Src) 99.1 F (37.3 C) (Oral)  Ht 5\' 9"  (1.753 m)  Wt 275 lb (124.739 kg)  BMI 40.59 kg/m2  LMP 07/15/2015 General appearance: alert and cooperative Eyes: conjunctivae/corneas clear. PERRL, EOM's intact. Fundi benign. Ears: normal TM's and external ear canals both ears Nose: clear discharge, moderate congestion Throat: lips, mucosa, and tongue normal; teeth and gums normal Neck: no adenopathy, no carotid bruit, no JVD, supple, symmetrical, trachea midline and thyroid not enlarged, symmetric, no tenderness/mass/nodules Lungs: clear to auscultation bilaterally Heart: regular rate and rhythm, S1, S2 normal, no murmur, click, rub or gallop  Laboratory Strep test done. Results:negative.    Assessment:    Acute pharyngitis, likely  Viral pharyngitis.    Plan:   Force fluids Motrin or tylenol OTC OTC decongestant Throat lozenges if help New toothbrush in 3 days  Mary-Margaret Daphine DeutscherMartin, FNP

## 2015-07-25 ENCOUNTER — Encounter: Payer: Self-pay | Admitting: Family Medicine

## 2015-07-25 ENCOUNTER — Ambulatory Visit (INDEPENDENT_AMBULATORY_CARE_PROVIDER_SITE_OTHER): Payer: BLUE CROSS/BLUE SHIELD | Admitting: Family Medicine

## 2015-07-25 VITALS — BP 149/87 | HR 99 | Temp 97.6°F | Ht 69.0 in | Wt 277.6 lb

## 2015-07-25 DIAGNOSIS — J209 Acute bronchitis, unspecified: Secondary | ICD-10-CM | POA: Diagnosis not present

## 2015-07-25 MED ORDER — BENZONATATE 200 MG PO CAPS: 200.0000 mg | ORAL_CAPSULE | Freq: Two times a day (BID) | ORAL | Status: DC | PRN

## 2015-07-25 MED ORDER — AZITHROMYCIN 250 MG PO TABS: ORAL_TABLET | ORAL | Status: DC

## 2015-07-25 NOTE — Patient Instructions (Signed)
Great to meet you!   Acute Bronchitis Bronchitis is when the airways that extend from the windpipe into the lungs get red, puffy, and painful (inflamed). Bronchitis often causes thick spit (mucus) to develop. This leads to a cough. A cough is the most common symptom of bronchitis. In acute bronchitis, the condition usually begins suddenly and goes away over time (usually in 2 weeks). Smoking, allergies, and asthma can make bronchitis worse. Repeated episodes of bronchitis may cause more lung problems. HOME CARE  Rest.  Drink enough fluids to keep your pee (urine) clear or pale yellow (unless you need to limit fluids as told by your doctor).  Only take over-the-counter or prescription medicines as told by your doctor.  Avoid smoking and secondhand smoke. These can make bronchitis worse. If you are a smoker, think about using nicotine gum or skin patches. Quitting smoking will help your lungs heal faster.  Reduce the chance of getting bronchitis again by:  Washing your hands often.  Avoiding people with cold symptoms.  Trying not to touch your hands to your mouth, nose, or eyes.  Follow up with your doctor as told. GET HELP IF: Your symptoms do not improve after 1 week of treatment. Symptoms include:  Cough.  Fever.  Coughing up thick spit.  Body aches.  Chest congestion.  Chills.  Shortness of breath.  Sore throat. GET HELP RIGHT AWAY IF:   You have an increased fever.  You have chills.  You have severe shortness of breath.  You have bloody thick spit (sputum).  You throw up (vomit) often.  You lose too much body fluid (dehydration).  You have a severe headache.  You faint. MAKE SURE YOU:   Understand these instructions.  Will watch your condition.  Will get help right away if you are not doing well or get worse.   This information is not intended to replace advice given to you by your health care provider. Make sure you discuss any questions you  have with your health care provider.   Document Released: 10/14/2007 Document Revised: 12/28/2012 Document Reviewed: 10/18/2012 Elsevier Interactive Patient Education 2016 [employer]  

## 2015-07-25 NOTE — Progress Notes (Signed)
   HPI  Patient presents today here for acute illness.  Patient explains that she was seen about 6 days ago for sore throat which has seemed to get worsens that time. She explains she's had 6 days of nasal congestion, chest congestion, persistent cough, and sore throat. Her sore throat seems to be the worse and limit this time and is persistently getting worse. She is using Flonase for nasal congestion which helps but she is having dryness as well.   PMH: Smoking status noted ROS: Per HPI  Objective: BP 149/87 mmHg  Pulse 99  Temp(Src) 97.6 F (36.4 C) (Oral)  Ht 5\' 9"  (1.753 m)  Wt 277 lb 9.6 oz (125.919 kg)  BMI 40.98 kg/m2  LMP 07/15/2015 Gen: NAD, alert, cooperative with exam HEENT: NCAT, TMs normal bilaterally, nares with swelling of the turbinates bilaterally, oropharynx with swollen tonsils with no exudates Neck: Right-sided tender lymphadenopathy CV: RRR, good S1/S2, no murmur Resp: CTABL, no wheezes, non-labored Ext: No edema, warm Neuro: Alert and oriented, No gross deficits  Assessment and plan:  # Acute bronchitis, possible strep throat Considering worsening symptoms after 6 days we'll go ahead and treat with azithromycin Tonsils are swollen but no exudates, unclear strep, her recent rapid strep was negative Since Medicare discussed, return to clinic with any worsening symptoms or failure to improve. Adding nasal saline gel to Flonase    Meds ordered this encounter  Medications  . azithromycin (ZITHROMAX) 250 MG tablet    Sig: Take 2 tablets on day 1 and 1 tablet daily after that    Dispense:  6 tablet    Refill:  0  . benzonatate (TESSALON) 200 MG capsule    Sig: Take 1 capsule (200 mg total) by mouth 2 (two) times daily as needed for cough.    Dispense:  20 capsule    Refill:  0    Murtis SinkSam Jettie Mannor, MD [facility] 07/25/2015, 3:32 PM

## 2015-08-12 ENCOUNTER — Telehealth: Payer: Self-pay | Admitting: Family Medicine

## 2015-08-12 NOTE — Telephone Encounter (Signed)
Patient aware that she will need to be seen  

## 2015-08-26 ENCOUNTER — Ambulatory Visit: Payer: BLUE CROSS/BLUE SHIELD | Admitting: Family Medicine

## 2015-08-28 ENCOUNTER — Encounter: Payer: Self-pay | Admitting: Family Medicine

## 2015-10-03 ENCOUNTER — Encounter: Payer: Self-pay | Admitting: Family Medicine

## 2015-10-03 ENCOUNTER — Ambulatory Visit (INDEPENDENT_AMBULATORY_CARE_PROVIDER_SITE_OTHER): Payer: BLUE CROSS/BLUE SHIELD | Admitting: Family Medicine

## 2015-10-03 VITALS — BP 137/92 | HR 94 | Temp 97.2°F | Ht 69.0 in | Wt 275.4 lb

## 2015-10-03 DIAGNOSIS — R51 Headache: Secondary | ICD-10-CM | POA: Diagnosis not present

## 2015-10-03 DIAGNOSIS — R519 Headache, unspecified: Secondary | ICD-10-CM

## 2015-10-03 DIAGNOSIS — I10 Essential (primary) hypertension: Secondary | ICD-10-CM | POA: Diagnosis not present

## 2015-10-03 MED ORDER — METOPROLOL TARTRATE 25 MG PO TABS: 25.0000 mg | ORAL_TABLET | Freq: Two times a day (BID) | ORAL | Status: DC

## 2015-10-03 NOTE — Patient Instructions (Addendum)
Great to see you!  We will call with lab results within 1 week  Start metoprolol for blood pressure and headaches, 1/2 pill twice daily for 2 weeks then 1 pill twice daily  Follow up in 4-6 weeks

## 2015-10-03 NOTE — Progress Notes (Signed)
   HPI  Patient presents today here for follow-up high blood pressure.  Patient has had some high blood pressure readings in her system previously. Yesterday she had a blood pressure of 130/113, she states that she's worried her headaches may be associated with blood pressure.  She denies any chest pain, shortness of breath, leg edema, or palpitations.  Overall she feels well but she does complain of frequent headaches.  Her headaches last 2-3 hours, happened at least every other day, or described as bitemporal and throbbing in nature, associated nausea is frequent, as well as photophobia.  PMH: Smoking status noted ROS: Per HPI  Objective: BP 137/92 mmHg  Pulse 94  Temp(Src) 97.2 F (36.2 C) (Oral)  Ht '5\' 9"'$  (1.753 m)  Wt 275 lb 6.4 oz (124.921 kg)  BMI 40.65 kg/m2 Gen: NAD, alert, cooperative with exam HEENT: NCAT CV: RRR, good S1/S2, no murmur Resp: CTABL, no wheezes, non-labored Ext: No edema, warm Neuro: Alert and oriented, No gross deficits  Assessment and plan:  # Hypertension New diagnoses Overall it looks like she has mostly hypertensive episodes I will use a mild antihypertensive, metoprolol as she does have a few normal readings as well. I suspect her headaches may be associated with hypertension, also they may be migraine headaches, and in that case metoprolol would be good prophylaxis Follow-up 4-6 weeks Metop - 12.5 mg twice daily, increase to 25 mg twice daily in 2 weeks  # Morbid obesity Discussed diet and exercise A 15 pound increase since this time last year Labs  # Headache Some characteristics of migraine, however it could also be associated with elevated blood pressure. Metoprolol for blood pressure, possible migraine prophylaxis Monitor symptoms   Vitals - 1 value per visit 10/03/2015 07/25/2015 07/19/2015 2/89/7915  SYSTOLIC [phone]  DIASTOLIC 92 [phone]   Vitals - 1 value per visit 01/25/2015 3/96/8864  SYSTOLIC [phone]  DIASTOLIC  75 83      Orders Placed This Encounter  Procedures  . CMP14+EGFR  . CBC with Differential  . Lipid Panel  . TSH    Meds ordered this encounter  Medications  . metoprolol tartrate (LOPRESSOR) 25 MG tablet    Sig: Take 1 tablet (25 mg total) by mouth 2 (two) times daily.    Dispense:  60 tablet    Refill:  [place], MD [facility] 10/03/2015, 5:03 PM

## 2015-10-04 LAB — LIPID PANEL
Chol/HDL Ratio: 3.8 ratio units (ref 0.0–4.4)
Cholesterol, Total: 177 mg/dL (ref 100–199)
HDL: 47 mg/dL (ref >39)
LDL Calculated: 90 mg/dL (ref 0–99)
Triglycerides: 202 mg/dL — ABNORMAL HIGH (ref 0–149)
VLDL CHOLESTEROL CAL: 40 mg/dL (ref 5–40)

## 2015-10-04 LAB — CBC WITH DIFFERENTIAL/PLATELET
BASOS: 0 %
Basophils Absolute: 0 10*3/uL (ref 0.0–0.2)
EOS (ABSOLUTE): 0.1 10*3/uL (ref 0.0–0.4)
EOS: 1 %
HEMATOCRIT: 39.1 % (ref 34.0–46.6)
Hemoglobin: 12.9 g/dL (ref 11.1–15.9)
IMMATURE GRANULOCYTES: 0 %
Immature Grans (Abs): 0 10*3/uL (ref 0.0–0.1)
LYMPHS ABS: 2.4 10*3/uL (ref 0.7–3.1)
Lymphs: 42 %
MCH: 28.5 pg (ref 26.6–33.0)
MCHC: 33 g/dL (ref 31.5–35.7)
MCV: 87 fL (ref 79–97)
MONOS ABS: 0.4 10*3/uL (ref 0.1–0.9)
Monocytes: 6 %
NEUTROS ABS: 2.9 10*3/uL (ref 1.4–7.0)
NEUTROS PCT: 51 %
Platelets: 126 10*3/uL — ABNORMAL LOW (ref 150–379)
RBC: 4.52 x10E6/uL (ref 3.77–5.28)
RDW: 14.2 % (ref 12.3–15.4)
WBC: 5.8 10*3/uL (ref 3.4–10.8)

## 2015-10-04 LAB — CMP14+EGFR
A/G RATIO: 1.2 (ref 1.2–2.2)
ALT: 24 IU/L (ref 0–32)
AST: 24 IU/L (ref 0–40)
Albumin: 4.4 g/dL (ref 3.5–5.5)
Alkaline Phosphatase: 65 IU/L (ref 39–117)
BUN / CREAT RATIO: 11 (ref 9–23)
BUN: 7 mg/dL (ref 6–20)
Bilirubin Total: 0.6 mg/dL (ref 0.0–1.2)
CO2: 21 mmol/L (ref 18–29)
CREATININE: 0.65 mg/dL (ref 0.57–1.00)
Calcium: 9.5 mg/dL (ref 8.7–10.2)
Chloride: 102 mmol/L (ref 96–106)
GFR calc non Af Amer: 126 mL/min/{1.73_m2} (ref >59)
GFR, EST AFRICAN AMERICAN: 146 mL/min/{1.73_m2} (ref >59)
GLOBULIN, TOTAL: 3.8 g/dL (ref 1.5–4.5)
Glucose: 97 mg/dL (ref 65–99)
POTASSIUM: 3.7 mmol/L (ref 3.5–5.2)
SODIUM: 140 mmol/L (ref 134–144)
TOTAL PROTEIN: 8.2 g/dL (ref 6.0–8.5)

## 2015-10-04 LAB — TSH: TSH: 2.3 u[IU]/mL (ref 0.450–4.500)

## 2015-12-11 ENCOUNTER — Other Ambulatory Visit: Payer: Self-pay | Admitting: Family Medicine

## 2016-01-16 ENCOUNTER — Other Ambulatory Visit: Payer: Self-pay | Admitting: Family Medicine

## 2016-02-13 ENCOUNTER — Other Ambulatory Visit: Payer: Self-pay | Admitting: Family Medicine

## 2016-02-13 NOTE — Telephone Encounter (Signed)
Pt currently does not have insurance, she will try OTC omeprazole or prilosec

## 2016-02-23 ENCOUNTER — Emergency Department (HOSPITAL_COMMUNITY)
Admission: EM | Admit: 2016-02-23 | Discharge: 2016-02-23 | Disposition: A | Discharge status: Home / Self Care | Payer: 59 | Attending: Emergency Medicine | Admitting: Emergency Medicine

## 2016-02-23 ENCOUNTER — Encounter (HOSPITAL_COMMUNITY): Payer: Self-pay | Admitting: *Deleted

## 2016-02-23 DIAGNOSIS — Z23 Encounter for immunization: Secondary | ICD-10-CM | POA: Insufficient documentation

## 2016-02-23 DIAGNOSIS — T24211A Burn of second degree of right thigh, initial encounter: Secondary | ICD-10-CM

## 2016-02-23 DIAGNOSIS — T2122XA Burn of second degree of abdominal wall, initial encounter: Secondary | ICD-10-CM | POA: Insufficient documentation

## 2016-02-23 DIAGNOSIS — Y939 Activity, unspecified: Secondary | ICD-10-CM | POA: Insufficient documentation

## 2016-02-23 DIAGNOSIS — Y929 Unspecified place or not applicable: Secondary | ICD-10-CM | POA: Insufficient documentation

## 2016-02-23 DIAGNOSIS — I10 Essential (primary) hypertension: Secondary | ICD-10-CM | POA: Insufficient documentation

## 2016-02-23 DIAGNOSIS — Z79899 Other long term (current) drug therapy: Secondary | ICD-10-CM | POA: Insufficient documentation

## 2016-02-23 DIAGNOSIS — T24231A Burn of second degree of right lower leg, initial encounter: Secondary | ICD-10-CM | POA: Insufficient documentation

## 2016-02-23 DIAGNOSIS — X118XXA Contact with other hot tap-water, initial encounter: Secondary | ICD-10-CM | POA: Insufficient documentation

## 2016-02-23 DIAGNOSIS — Y999 Unspecified external cause status: Secondary | ICD-10-CM | POA: Insufficient documentation

## 2016-02-23 HISTORY — DX: Essential (primary) hypertension: I10

## 2016-02-23 MED ORDER — KETOROLAC TROMETHAMINE 60 MG/2ML IM SOLN
60.0000 mg | Freq: Once | INTRAMUSCULAR | Status: AC
  Administered 2016-02-23: 60 mg via INTRAMUSCULAR
  Filled 2016-02-23: qty 2

## 2016-02-23 MED ORDER — SILVER SULFADIAZINE 1 % EX CREA
TOPICAL_CREAM | Freq: Every day | CUTANEOUS | Status: DC
  Administered 2016-02-23: 06:00:00 via TOPICAL
  Filled 2016-02-23: qty 50

## 2016-02-23 MED ORDER — TETANUS-DIPHTH-ACELL PERTUSSIS 5-2.5-18.5 LF-MCG/0.5 IM SUSP
0.5000 mL | Freq: Once | INTRAMUSCULAR | Status: AC
  Administered 2016-02-23: 0.5 mL via INTRAMUSCULAR
  Filled 2016-02-23: qty 0.5
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
## 2016-02-23 NOTE — ED Triage Notes (Signed)
Pt states that she spilled a pot of boiling water on her this am burning her abd, right upper leg and right hand,

## 2016-04-27 ENCOUNTER — Emergency Department (HOSPITAL_COMMUNITY)
Admission: EM | Admit: 2016-04-27 | Discharge: 2016-04-27 | Disposition: A | Discharge status: Home / Self Care | Payer: 59 | Attending: Emergency Medicine | Admitting: Emergency Medicine

## 2016-04-27 ENCOUNTER — Encounter (HOSPITAL_COMMUNITY): Payer: Self-pay | Admitting: Emergency Medicine

## 2016-04-27 DIAGNOSIS — I10 Essential (primary) hypertension: Secondary | ICD-10-CM | POA: Insufficient documentation

## 2016-04-27 DIAGNOSIS — Z79899 Other long term (current) drug therapy: Secondary | ICD-10-CM | POA: Insufficient documentation

## 2016-04-27 DIAGNOSIS — J069 Acute upper respiratory infection, unspecified: Secondary | ICD-10-CM | POA: Insufficient documentation

## 2016-04-27 NOTE — Discharge Instructions (Signed)
If you were given medicines take as directed.  If you are on coumadin or contraceptives realize their levels and effectiveness is altered by many different medicines.  If you have any reaction (rash, tongues swelling, other) to the medicines stop taking and see a physician.    If your blood pressure was elevated in the ER make sure you follow up for management with a primary doctor or return for chest pain, shortness of breath or stroke symptoms.  Please follow up as directed and return to the ER or see a physician for new or worsening symptoms.  Thank you. Vitals:   04/27/16 1042  BP: 129/82  Pulse: 75  Resp: 20  Temp: 97.9 F (36.6 C)  TempSrc: Temporal  SpO2: 100%  Weight: 270 lb (122.5 kg)  Height: 5\' 9"  (1.753 m)

## 2016-04-27 NOTE — ED Provider Notes (Signed)
[facility] Provider Note   CSN: [id number] Arrival date & time: 04/27/16  1020  By signing my name below, I, Javier Dockerobert Ryan Halas, attest that this documentation has been prepared under the direction and in the presence of Blane OharaJoshua Evelyna Folker, MD. Electronically Signed: Javier Dockerobert Ryan Halas, ER Scribe. 12/21/2015. 11:04 AM.  History   Chief Complaint Chief Complaint  Patient presents with  . Sore Throat   HPI  HPI Comments: Amy Nichols is a 20 y.o. female who presents to the Emergency Department complaining of three weeks of sore throat and cough. She denies fever, chills. She has a PMHx of HTN. She does not have a PMHx of DM.    Past Medical History:  Diagnosis Date  . Allergy   . GERD (gastroesophageal reflux disease)   . Hypertension     Patient Active Problem List   Diagnosis Date Noted  . HTN (hypertension) 10/03/2015  . Morbid obesity (HCC) 10/03/2015    History reviewed. No pertinent surgical history.  OB History    No data available       Home Medications    Prior to Admission medications   Medication Sig Start Date End Date Taking? Authorizing Provider  adapalene (DIFFERIN) 0.1 % gel Apply topically at bedtime. 06/27/14   Mechele ClaudeWarren Stacks, MD  fluticasone (FLONASE) 50 MCG/ACT nasal spray Place 2 sprays into both nostrils daily. 06/27/14   Mechele ClaudeWarren Stacks, MD  metoprolol tartrate (LOPRESSOR) 25 MG tablet TAKE 1 TABLET (25 MG TOTAL) BY MOUTH 2 (TWO) TIMES DAILY. 01/16/16   Mechele ClaudeWarren Stacks, MD  omeprazole (PRILOSEC) 20 MG capsule TAKE ONE CAPSULE BY MOUTH EVERY DAY 12/11/15   Mechele ClaudeWarren Stacks, MD    Family History Family History  Problem Relation Age of Onset  . Asthma Father     Social History Social History  Substance Use Topics  . Smoking status: Never Smoker  . Smokeless tobacco: Never Used  . Alcohol use No     Allergies   Patient has no known allergies.   Review of Systems Review of Systems  Constitutional: Negative for chills and fever.  HENT:  Positive for rhinorrhea and sore throat.   Respiratory: Positive for cough. Negative for wheezing.     Physical Exam Updated Vital Signs BP 129/82 (BP Location: Left Arm)   Pulse 75   Temp 97.9 F (36.6 C) (Temporal)   Resp 20   Ht 5\' 9"  (1.753 m)   Wt 270 lb (122.5 kg)   LMP 04/27/2016   SpO2 100%   BMI 39.87 kg/m   Physical Exam  Constitutional: She is oriented to person, place, and time. She appears well-developed and well-nourished. No distress.  HENT:  Head: Normocephalic and atraumatic.  Very mild erythema posterior pharynx. No exudate, no erythema.   Eyes: Pupils are equal, round, and reactive to light.  Neck: Neck supple.  Mild anterior cervical adenopathy. Neck supple.  Cardiovascular: Normal rate.   Pulmonary/Chest: Effort normal. No respiratory distress.  Musculoskeletal: Normal range of motion.  Neurological: She is alert and oriented to person, place, and time. Coordination normal.  Skin: Skin is warm and dry. She is not diaphoretic.  Psychiatric: She has a normal mood and affect. Her behavior is normal.  Nursing note and vitals reviewed.   ED Treatments / Results  Labs (all labs ordered are listed, but only abnormal results are displayed) Labs Reviewed - No data to display  EKG  EKG Interpretation None       Radiology No results found.  Procedures Procedures (including critical care time)  Medications Ordered in ED Medications - No data to display   Initial Impression / Assessment and Plan / ED Course  I have reviewed the triage vital signs and the nursing notes.  Pertinent labs & imaging results that were available during my care of the patient were reviewed by me and considered in my medical decision making (see chart for details).  Clinical Course    Patient presents with clinically viral syndrome. Normal exam. Discussed supportive care.  Results and differential diagnosis were discussed with the patient/parent/guardian. Xrays were  independently reviewed by myself.  Close follow up outpatient was discussed, comfortable with the plan.   Medications - No data to display  Vitals:   04/27/16 1042  BP: 129/82  Pulse: 75  Resp: 20  Temp: 97.9 F (36.6 C)  TempSrc: Temporal  SpO2: 100%  Weight: 270 lb (122.5 kg)  Height: 5\' 9"  (1.753 m)    Final diagnoses:  Viral upper respiratory tract infection    Final Clinical Impressions(s) / ED Diagnoses   Final diagnoses:  None    New Prescriptions New Prescriptions   No medications on file        Blane OharaJoshua Thelda Gagan, MD 04/27/16 1132

## 2016-04-27 NOTE — ED Triage Notes (Signed)
Pt state she has not felt well for one month. Multiple symptoms, sore throat, cough, headache, ear ache. Pt has been using OTC treatment without relief

## 2016-05-13 ENCOUNTER — Ambulatory Visit (INDEPENDENT_AMBULATORY_CARE_PROVIDER_SITE_OTHER): Payer: BLUE CROSS/BLUE SHIELD | Admitting: Family Medicine

## 2016-05-13 ENCOUNTER — Encounter: Payer: Self-pay | Admitting: Family Medicine

## 2016-05-13 VITALS — BP 121/78 | HR 92 | Temp 96.9°F | Ht 69.0 in | Wt 280.2 lb

## 2016-05-13 DIAGNOSIS — N3000 Acute cystitis without hematuria: Secondary | ICD-10-CM | POA: Diagnosis not present

## 2016-05-13 DIAGNOSIS — Z3009 Encounter for other general counseling and advice on contraception: Secondary | ICD-10-CM | POA: Diagnosis not present

## 2016-05-13 LAB — MICROSCOPIC EXAMINATION
Epithelial Cells (non renal): >10 /hpf — AB (ref 0–10)
Renal Epithel, UA: NONE SEEN /hpf

## 2016-05-13 LAB — URINALYSIS, COMPLETE
Bilirubin, UA: NEGATIVE
Glucose, UA: NEGATIVE
Nitrite, UA: NEGATIVE
RBC, UA: NEGATIVE
Specific Gravity, UA: 1.02 (ref 1.005–1.030)
Urobilinogen, Ur: 0.2 mg/dL (ref 0.2–1.0)
pH, UA: 6 (ref 5.0–7.5)

## 2016-05-13 LAB — PREGNANCY, URINE: Preg Test, Ur: NEGATIVE

## 2016-05-13 MED ORDER — SULFAMETHOXAZOLE-TRIMETHOPRIM 800-160 MG PO TABS: 1.0000 | ORAL_TABLET | Freq: Two times a day (BID) | ORAL | 0 refills | Status: DC

## 2016-05-13 NOTE — Progress Notes (Signed)
BP 121/78   Pulse 92   Temp (!) 96.9 F (36.1 C) (Oral)   Ht 5\' 9"  (1.753 m)   Wt 280 lb 4 oz (127.1 kg)   LMP 04/27/2016   BMI 41.39 kg/m    Subjective:    Patient ID: Amy Nichols, female    DOB: 1996-01-12, 21 y.o.   MRN: [id number]  HPI: Amy Nichols is a 21 y.o. female presenting on 05/13/2016 for Urinary Tract Infection (dysuria, urinary frequency, scant urine; symptoms began about 2 weeks ago, tried AZO and it helped for a while but symptoms have worsened in last week)   HPI Dysuria Patient has been having dysuria for past 2-3 weeks. She has also been having urinary frequency. She has been trying to increase her water intake to flush it out and also took one dose of azathioprine and it helps but the symptoms returned and have worsened over the past week. She denies any fevers or chills or flank pain or abdominal pain. She denies any blood in her urine. She has had a urinary tract infection similar to this about 1 year ago which had to be treated with an antibiotic. She denies any vaginal discharge or vaginal irritation.  Relevant past medical, surgical, family and social history reviewed and updated as indicated. Interim medical history since our last visit reviewed. Allergies and medications reviewed and updated.  Review of Systems  Constitutional: Negative for chills and fever.  Eyes: Negative for redness and visual disturbance.  Respiratory: Negative for chest tightness and shortness of breath.   Cardiovascular: Negative for chest pain and leg swelling.  Genitourinary: Positive for dysuria and frequency. Negative for difficulty urinating, hematuria, pelvic pain, urgency, vaginal bleeding, vaginal discharge and vaginal pain.  Musculoskeletal: Negative for back pain and gait problem.  Skin: Negative for rash.  Neurological: Negative for light-headedness and headaches.  Psychiatric/Behavioral: Negative for agitation and behavioral problems.  All other systems reviewed  and are negative.   Per HPI unless specifically indicated above      Objective:    BP 121/78   Pulse 92   Temp (!) 96.9 F (36.1 C) (Oral)   Ht 5\' 9"  (1.753 m)   Wt 280 lb 4 oz (127.1 kg)   LMP 04/27/2016   BMI 41.39 kg/m   Wt Readings from Last 3 Encounters:  05/13/16 280 lb 4 oz (127.1 kg)  04/27/16 270 lb (122.5 kg)  02/23/16 275 lb (124.7 kg)    Physical Exam  Constitutional: She is oriented to person, place, and time. She appears well-developed and well-nourished. No distress.  Eyes: Conjunctivae are normal.  Cardiovascular: Normal rate, regular rhythm, normal heart sounds and intact distal pulses.   No murmur heard. Pulmonary/Chest: Effort normal and breath sounds normal. No respiratory distress. She has no wheezes.  Abdominal: Soft. Bowel sounds are normal. She exhibits no distension. There is no hepatosplenomegaly. There is no tenderness (No abdominal pain on exam). There is no rebound and no CVA tenderness.  Musculoskeletal: Normal range of motion. She exhibits no edema or tenderness.  Neurological: She is alert and oriented to person, place, and time. Coordination normal.  Skin: Skin is warm and dry. No rash noted. She is not diaphoretic.  Psychiatric: She has a normal mood and affect. Her behavior is normal.  Nursing note and vitals reviewed.  Urinalysis: 6-10 WBCs, 0-2 RBCs, greater than 10 epithelial cells, mucus present, moderate bacteria.    Assessment & Plan:   Problem List Items Addressed This Visit  None    Visit Diagnoses    Acute cystitis without hematuria    -  Primary   Relevant Medications   sulfamethoxazole-trimethoprim (BACTRIM DS,SEPTRA DS) 800-160 MG tablet   Other Relevant Orders   Urinalysis, Complete   Counseling for birth control, intrauterine device       Patient wants to come in for Mirena, nulliparous, understands risks and side effects from medication   Relevant Orders   Pregnancy, urine       Follow up plan: Return if  symptoms worsen or fail to improve.  Counseling provided for all of the vaccine components Orders Placed This Encounter  Procedures  . Urinalysis, Complete  . Pregnancy, urine    Arville Care, MD [facility] 05/13/2016, 11:40 AM

## 2016-05-19 ENCOUNTER — Telehealth: Payer: Self-pay | Admitting: Family Medicine

## 2016-05-19 NOTE — Telephone Encounter (Signed)
Patient called stating that she now has a yeast infection and would like for something be sent to her pharmacy

## 2016-05-20 NOTE — Telephone Encounter (Signed)
Go ahead and call in Diflucan 150 mg 1 

## 2016-05-20 NOTE — Telephone Encounter (Signed)
Aware.  Diflucan script left on voice mail of [facility].

## 2016-05-26 ENCOUNTER — Telehealth: Payer: Self-pay | Admitting: Family Medicine

## 2016-05-26 NOTE — Telephone Encounter (Signed)
Spoke to pt and she states she was told to call and get appt for Mirena insertion after her menses ended. Pt has never had a baby and I know we usually have them come in on their menses but wanted to clarify with you first. Please advise.

## 2016-05-28 NOTE — Telephone Encounter (Signed)
Left message to call.

## 2016-05-28 NOTE — Telephone Encounter (Signed)
I usually tell them to call when there. Is beginning and make an appointment for the last day of her menses, she must have misunderstood that. I would like to put in the Mirena on the last day of her period, especially important because she is a nulliparous, I also tried to talk her out of the Mirena and try something else but she was very persistent on what she wanted. I would not recommend for her to come in outside of it being the last day or 2 of her menses.

## 2016-05-29 ENCOUNTER — Telehealth: Payer: Self-pay | Admitting: Family Medicine

## 2016-05-29 NOTE — Telephone Encounter (Signed)
Pt aware that she has to call the first day she starts her menses to make the appt for a mirena appt

## 2016-06-04 NOTE — Telephone Encounter (Signed)
Aware to schedule an appointment on when she begins her menses for 3 or 4 days later.

## 2016-06-17 ENCOUNTER — Other Ambulatory Visit: Payer: Self-pay | Admitting: Family Medicine

## 2016-06-17 ENCOUNTER — Telehealth: Payer: Self-pay | Admitting: Family Medicine

## 2016-06-17 MED ORDER — FLUTICASONE FUROATE 27.5 MCG/SPRAY NA SUSP: 2.0000 | Freq: Every day | NASAL | 12 refills | Status: DC

## 2016-06-19 ENCOUNTER — Telehealth: Payer: Self-pay | Admitting: Family Medicine

## 2016-06-19 NOTE — Telephone Encounter (Signed)
appt scheduled for Mirena

## 2016-06-24 ENCOUNTER — Encounter: Payer: Self-pay | Admitting: Family Medicine

## 2016-06-24 ENCOUNTER — Ambulatory Visit (INDEPENDENT_AMBULATORY_CARE_PROVIDER_SITE_OTHER): Payer: BLUE CROSS/BLUE SHIELD | Admitting: Family Medicine

## 2016-06-24 VITALS — BP 135/89 | HR 109 | Temp 99.1°F | Ht 69.0 in | Wt 280.0 lb

## 2016-06-24 DIAGNOSIS — R509 Fever, unspecified: Secondary | ICD-10-CM

## 2016-06-24 DIAGNOSIS — J111 Influenza due to unidentified influenza virus with other respiratory manifestations: Secondary | ICD-10-CM

## 2016-06-24 LAB — VERITOR FLU A/B WAIVED
INFLUENZA A: NEGATIVE
Influenza B: NEGATIVE

## 2016-06-24 MED ORDER — MOMETASONE FUROATE 50 MCG/ACT NA SUSP: 2.0000 | Freq: Every day | NASAL | 11 refills | Status: DC

## 2016-06-24 MED ORDER — OSELTAMIVIR PHOSPHATE 75 MG PO CAPS: 75.0000 mg | ORAL_CAPSULE | Freq: Two times a day (BID) | ORAL | 0 refills | Status: DC

## 2016-06-24 NOTE — Progress Notes (Signed)
Subjective:  Patient ID: Amy Nichols, female    DOB: 08/27/1995  Age: 21 y.o. MRN: [id number]  CC: Cough; Shortness of Breath; and Headache   HPI Amy Nichols presents for  Patient presents with dry cough runny stuffy nose starting yesterday. Diffuse headache of moderate intensity started 2 days ago. Is less today.. Patient also has chills and sweats. Body aches  Are mild. Has made her feel run down   History Amy Nichols has a past medical history of Allergy; GERD (gastroesophageal reflux disease); and Hypertension.   She has no past surgical history on file.   Her family history includes Asthma in her father.She reports that she has never smoked. She has never used smokeless tobacco. She reports that she does not drink alcohol or use drugs.    ROS Review of Systems  Constitutional: Positive for appetite change, chills and fever.  HENT: Positive for congestion and rhinorrhea. Negative for ear pain, nosebleeds, postnasal drip, sinus pressure and sore throat.   Respiratory: Negative for chest tightness and shortness of breath.   Cardiovascular: Negative for chest pain.  Musculoskeletal: Positive for myalgias.  Skin: Negative for rash.  Neurological: Positive for headaches.    Objective:  BP 135/89   Pulse (!) 109   Temp 99.1 F (37.3 C) (Oral)   Ht 5\' 9"  (1.753 m)   Wt 280 lb (127 kg)   BMI 41.35 kg/m   BP Readings from Last 3 Encounters:  06/24/16 135/89  05/13/16 121/78  04/27/16 129/82    Wt Readings from Last 3 Encounters:  06/24/16 280 lb (127 kg)  05/13/16 280 lb 4 oz (127.1 kg)  04/27/16 270 lb (122.5 kg)     Physical Exam  Constitutional: She appears well-developed and well-nourished.  HENT:  Head: Normocephalic and atraumatic.  Right Ear: Tympanic membrane and external ear normal. No decreased hearing is noted.  Left Ear: Tympanic membrane and external ear normal. No decreased hearing is noted.  Nose: Mucosal edema present. Right sinus exhibits no  frontal sinus tenderness. Left sinus exhibits no frontal sinus tenderness.  Mouth/Throat: No oropharyngeal exudate or posterior oropharyngeal erythema.  Neck: No Brudzinski's sign noted.  Pulmonary/Chest: Breath sounds normal. No respiratory distress.  Lymphadenopathy:       Head (right side): No preauricular adenopathy present.       Head (left side): No preauricular adenopathy present.       Right cervical: No superficial cervical adenopathy present.      Left cervical: No superficial cervical adenopathy present.    No results found.  Assessment & Plan:   Amy Nichols was seen today for cough, shortness of breath and headache.  Diagnoses and all orders for this visit:  Fever, unspecified fever cause -     Veritor Flu A/B Waived  Influenza with respiratory manifestation  Other orders -     oseltamivir (TAMIFLU) 75 MG capsule; Take 1 capsule (75 mg total) by mouth 2 (two) times daily.    I have discontinued [name] fluticasone. I am also having her start on oseltamivir. Additionally, I am having her maintain her adapalene, omeprazole, metoprolol tartrate, and sulfamethoxazole-trimethoprim.  Allergies as of 06/24/2016   No Known Allergies     Medication List       Accurate as of 06/24/16  6:30 PM. Always use your most recent med list.          adapalene 0.1 % gel Commonly known as:  DIFFERIN Apply topically at bedtime.   metoprolol tartrate 25 MG  tablet Commonly known as:  LOPRESSOR TAKE 1 TABLET (25 MG TOTAL) BY MOUTH 2 (TWO) TIMES DAILY.   omeprazole 20 MG capsule Commonly known as:  PRILOSEC TAKE ONE CAPSULE BY MOUTH EVERY DAY   oseltamivir 75 MG capsule Commonly known as:  TAMIFLU Take 1 capsule (75 mg total) by mouth 2 (two) times daily.   sulfamethoxazole-trimethoprim 800-160 MG tablet Commonly known as:  BACTRIM DS,SEPTRA DS Take 1 tablet by mouth 2 (two) times daily.        Follow-up: Return if symptoms worsen or fail to improve.  Mechele Claude, M.D.

## 2016-06-25 ENCOUNTER — Ambulatory Visit: Payer: BLUE CROSS/BLUE SHIELD | Admitting: Family Medicine

## 2016-06-25 ENCOUNTER — Emergency Department (HOSPITAL_COMMUNITY)
Admission: EM | Admit: 2016-06-25 | Discharge: 2016-06-25 | Disposition: A | Discharge status: Home / Self Care | Payer: BLUE CROSS/BLUE SHIELD | Attending: Emergency Medicine | Admitting: Emergency Medicine

## 2016-06-25 ENCOUNTER — Encounter (HOSPITAL_COMMUNITY): Payer: Self-pay | Admitting: Emergency Medicine

## 2016-06-25 ENCOUNTER — Emergency Department (HOSPITAL_COMMUNITY): Payer: BLUE CROSS/BLUE SHIELD

## 2016-06-25 DIAGNOSIS — I10 Essential (primary) hypertension: Secondary | ICD-10-CM | POA: Insufficient documentation

## 2016-06-25 DIAGNOSIS — Z79899 Other long term (current) drug therapy: Secondary | ICD-10-CM | POA: Insufficient documentation

## 2016-06-25 DIAGNOSIS — B349 Viral infection, unspecified: Secondary | ICD-10-CM

## 2016-06-25 DIAGNOSIS — R05 Cough: Secondary | ICD-10-CM | POA: Diagnosis present

## 2016-06-25 MED ORDER — ALBUTEROL SULFATE HFA 108 (90 BASE) MCG/ACT IN AERS
4.0000 | INHALATION_SPRAY | RESPIRATORY_TRACT | Status: AC
  Administered 2016-06-25: 4 via RESPIRATORY_TRACT
  Filled 2016-06-25: qty 6.7

## 2016-06-25 MED ORDER — BENZONATATE 100 MG PO CAPS: 100.0000 mg | ORAL_CAPSULE | Freq: Three times a day (TID) | ORAL | 0 refills | Status: DC | PRN

## 2016-06-25 NOTE — ED Triage Notes (Signed)
Pt says she was diagnosed with flu yesterdayat PCP but here today because she feel SOB.  C/o non productive cough.  Pt says flu symptoms has gotten worse.

## 2016-06-25 NOTE — ED Notes (Signed)
ED Provider at bedside. 

## 2016-06-25 NOTE — ED Provider Notes (Signed)
[facility] DEPT Provider Note   CSN: [id number] Arrival date & time: 06/25/16  1230     History   Chief Complaint Chief Complaint  Patient presents with  . Shortness of Breath    HPI Amy Nichols is a 21 y.o. female.  HPI  Pt was seen at 1610.  Per pt, c/o gradual onset and persistence of constant sore throat, runny/stuffy nose, sinus congestion, and cough for the past 2 days. Has been associated with generalized body aches/fatigue and subjective home fevers/chills. Pt was evaluated by her PMD yesterday for these symptoms, dx ILI, rx tamiflu. Pt states all of her symptoms "feel worse today." Denies rash, no CP/palpitations, no N/V/D, no abd pain.     Past Medical History:  Diagnosis Date  . Allergy   . GERD (gastroesophageal reflux disease)   . Hypertension     Patient Active Problem List   Diagnosis Date Noted  . HTN (hypertension) 10/03/2015  . Morbid obesity (HCC) 10/03/2015    History reviewed. No pertinent surgical history.  OB History    No data available       Home Medications    Prior to Admission medications   Medication Sig Start Date End Date Taking? Authorizing Provider  metoprolol tartrate (LOPRESSOR) 25 MG tablet TAKE 1 TABLET (25 MG TOTAL) BY MOUTH 2 (TWO) TIMES DAILY. Patient taking differently: Take 25 mg by mouth daily.  01/16/16  Yes Mechele ClaudeWarren Stacks, MD  mometasone (NASONEX) 50 MCG/ACT nasal spray Place 2 sprays into the nose daily. 06/24/16  Yes Mechele ClaudeWarren Stacks, MD  omeprazole (PRILOSEC) 20 MG capsule TAKE ONE CAPSULE BY MOUTH EVERY DAY 12/11/15  Yes Mechele ClaudeWarren Stacks, MD  oseltamivir (TAMIFLU) 75 MG capsule Take 1 capsule (75 mg total) by mouth 2 (two) times daily. 06/24/16  Yes Mechele ClaudeWarren Stacks, MD  sulfamethoxazole-trimethoprim (BACTRIM DS,SEPTRA DS) 800-160 MG tablet Take 1 tablet by mouth 2 (two) times daily. 05/13/16   Elige RadonJoshua A Dettinger, MD    Family History Family History  Problem Relation Age of Onset  . Asthma Father     Social  History Social History  Substance Use Topics  . Smoking status: Never Smoker  . Smokeless tobacco: Never Used  . Alcohol use No     Allergies   Patient has no known allergies.   Review of Systems Review of Systems ROS: Statement: All systems negative except as marked or noted in the HPI; Constitutional: +subjective fever and chills, generalized body aches/fatigue.. ; ; Eyes: Negative for eye pain, redness and discharge. ; ; ENMT: Negative for ear pain, hoarseness, +nasal congestion, sinus pressure and sore throat. ; ; Cardiovascular: Negative for chest pain, palpitations, diaphoresis, dyspnea and peripheral edema. ; ; Respiratory: +cough. Negative for wheezing and stridor. ; ; Gastrointestinal: Negative for nausea, vomiting, diarrhea, abdominal pain, blood in stool, hematemesis, jaundice and rectal bleeding. . ; ; Genitourinary: Negative for dysuria, flank pain and hematuria. ; ; Musculoskeletal: Negative for back pain and neck pain. Negative for swelling and trauma.; ; Skin: Negative for pruritus, rash, abrasions, blisters, bruising and skin lesion.; ; Neuro: Negative for headache, lightheadedness and neck stiffness. Negative for weakness, altered level of consciousness, altered mental status, extremity weakness, paresthesias, involuntary movement, seizure and syncope.       Physical Exam Updated Vital Signs BP 141/81 (BP Location: Left Arm)   Pulse 110   Temp 99 F (37.2 C) (Oral)   Resp 20   Ht 5\' 9"  (1.753 m)   Wt 280 lb (127 kg)  LMP 06/19/2016 (Exact Date)   SpO2 97%   BMI 41.35 kg/m   Physical Exam 1615: Physical examination:  Nursing notes reviewed; Vital signs and O2 SAT reviewed;  Constitutional: Well developed, Well nourished, Well hydrated, In no acute distress; Head:  Normocephalic, atraumatic; Eyes: EOMI, PERRL, No scleral icterus; ENMT: TM's clear bilat. +edemetous nasal turbinates bilat with clear rhinorrhea. Mouth and pharynx without lesions. No tonsillar  exudates. No intra-oral edema. No submandibular or sublingual edema. No hoarse voice, no drooling, no stridor. No pain with manipulation of larynx. No trismus. Mouth and pharynx normal, Mucous membranes moist; Neck: Supple, Full range of motion, No lymphadenopathy; Cardiovascular: Regular rate and rhythm, No gallop; Respiratory: Breath sounds clear & equal bilaterally, No wheezes.  Speaking full sentences with ease, Normal respiratory effort/excursion; Chest: Nontender, Movement normal; Abdomen: Soft, Nontender, Nondistended, Normal bowel sounds; Genitourinary: No CVA tenderness; Extremities: Pulses normal, No tenderness, No edema, No calf edema or asymmetry.; Neuro: AA&Ox3, Major CN grossly intact.  Speech clear. No gross focal motor or sensory deficits in extremities.; Skin: Color normal, Warm, Dry.   ED Treatments / Results  Labs (all labs ordered are listed, but only abnormal results are displayed)   EKG  EKG Interpretation None       Radiology   Procedures Procedures (including critical care time)  Medications Ordered in ED Medications  albuterol (PROVENTIL HFA;VENTOLIN HFA) 108 (90 Base) MCG/ACT inhaler 4 puff (not administered)     Initial Impression / Assessment and Plan / ED Course  I have reviewed the triage vital signs and the nursing notes.  Pertinent labs & imaging results that were available during my care of the patient were reviewed by me and considered in my medical decision making (see chart for details).  MDM Reviewed: previous chart, nursing note and vitals Interpretation: x-ray   Dg Chest 2 View Result Date: 06/25/2016 CLINICAL DATA:  Diagnosed with flu yesterday. New onset of shortness of breath. History of bronchitis and hypertension. EXAM: CHEST  2 VIEW COMPARISON:  None in PACs FINDINGS: The lungs are adequately inflated. There is no focal infiltrate. There is no pleural effusion. The heart and pulmonary vascularity are normal. The mediastinum is normal  in width. The bony thorax exhibits no acute abnormality. IMPRESSION: There is no pneumonia nor other acute cardiopulmonary abnormality. Electronically Signed   By: David  [place] M.D.   On: 06/25/2016 13:57    1630:  CXR reassuring. Already is taking tamiflu. Tx symptomatically, f/u PMD. Dx and testing d/w pt.  Questions answered.  Verb understanding, agreeable to d/c home with outpt f/u.    Final Clinical Impressions(s) / ED Diagnoses   Final diagnoses:  None    New Prescriptions New Prescriptions   No medications on file     Samuel Jester, DO 06/28/16 2203

## 2016-06-25 NOTE — Discharge Instructions (Signed)
Take over the counter decongestant (such as sudafed), as directed on packaging, for the next week.  Use over the counter normal saline nasal spray, with frequent nose blowing, several times per day for the next 2 weeks. Take over the counter tylenol and ibuprofen, as directed on packaging, as needed for discomfort.  Gargle with warm water several times per day to help with discomfort.  May also use over the counter sore throat pain medicines such as chloraseptic or sucrets, as directed on packaging, as needed for discomfort. Take the prescription as directed.  Use your albuterol inhaler (2 to 4 puffs) every 4 hours for the next 7 days, then as needed for cough, wheezing, or shortness of breath.  Call your regular medical doctor tomorrow morning to schedule a follow up appointment within the next 2 days.  Return to the Emergency Department immediately sooner if worsening.

## 2016-07-15 ENCOUNTER — Ambulatory Visit (INDEPENDENT_AMBULATORY_CARE_PROVIDER_SITE_OTHER): Payer: BLUE CROSS/BLUE SHIELD | Admitting: Family Medicine

## 2016-07-15 ENCOUNTER — Encounter: Payer: Self-pay | Admitting: Family Medicine

## 2016-07-15 VITALS — BP 123/76 | HR 78 | Temp 97.4°F | Ht 69.0 in | Wt 279.0 lb

## 2016-07-15 DIAGNOSIS — I889 Nonspecific lymphadenitis, unspecified: Secondary | ICD-10-CM | POA: Diagnosis not present

## 2016-07-15 DIAGNOSIS — H6501 Acute serous otitis media, right ear: Secondary | ICD-10-CM | POA: Diagnosis not present

## 2016-07-15 MED ORDER — AMOXICILLIN-POT CLAVULANATE 875-125 MG PO TABS: 1.0000 | ORAL_TABLET | Freq: Two times a day (BID) | ORAL | 0 refills | Status: DC

## 2016-07-15 NOTE — Progress Notes (Signed)
Subjective:  Patient ID: Amy Nichols, female    DOB: 12/20/1995  Age: 21 y.o. MRN: [id number]  CC: Sore Throat (pt here today c/o sore throat)   HPI Amy Nichols presents for 1 week of increasing pain below the right ear. She's noted some swelling and tenderness to touch. Onset about a week ago. There has been no change in hearing. She can chew okay there is no pain in the jaw. No sore throat. No fever chills or sweats.  History Amy Nichols has a past medical history of Allergy; GERD (gastroesophageal reflux disease); and Hypertension.   She has no past surgical history on file.   Her family history includes Asthma in her father.She reports that she has never smoked. She has never used smokeless tobacco. She reports that she does not drink alcohol or use drugs.  Current Outpatient Prescriptions on File Prior to Visit  Medication Sig Dispense Refill  . metoprolol tartrate (LOPRESSOR) 25 MG tablet TAKE 1 TABLET (25 MG TOTAL) BY MOUTH 2 (TWO) TIMES DAILY. (Patient taking differently: Take 25 mg by mouth daily. ) 60 tablet 2  . omeprazole (PRILOSEC) 20 MG capsule TAKE ONE CAPSULE BY MOUTH EVERY DAY 30 capsule 5   No current facility-administered medications on file prior to visit.     ROS Review of Systems  Unremarkable except as in history of present illness  Objective:  BP 123/76   Pulse 78   Temp 97.4 F (36.3 C) (Oral)   Ht 5\' 9"  (1.753 m)   Wt 279 lb (126.6 kg)   LMP 06/19/2016 (Exact Date)   BMI 41.20 kg/m   Physical Exam  Constitutional: She appears well-developed and well-nourished.  HENT:  Head: Normocephalic and atraumatic.  Right Ear: Hearing and external ear normal. A middle ear effusion is present. No decreased hearing is noted.  Left Ear: Hearing, tympanic membrane and external ear normal. No decreased hearing is noted.  Nose: Mucosal edema present. Right sinus exhibits no frontal sinus tenderness. Left sinus exhibits no frontal sinus tenderness.    Mouth/Throat: No oropharyngeal exudate or posterior oropharyngeal erythema.  Neck: No Brudzinski's sign noted.  Pulmonary/Chest: Breath sounds normal. No respiratory distress.  Lymphadenopathy:       Head (right side): Preauricular adenopathy present.       Head (left side): No preauricular adenopathy present.    She has cervical adenopathy (To the right just below the ear. 2+ tender enlarged anterior cervical nodes superiorly).       Right cervical: Superficial cervical and deep cervical adenopathy present.       Left cervical: No superficial cervical adenopathy present.    Assessment & Plan:   Amy Nichols was seen today for sore throat.  Diagnoses and all orders for this visit:  Lymphadenitis  Right acute serous otitis media, recurrence not specified  Other orders -     amoxicillin-clavulanate (AUGMENTIN) 875-125 MG tablet; Take 1 tablet by mouth 2 (two) times daily. Take all of this medication   I have discontinued Ms. Triggs's sulfamethoxazole-trimethoprim, oseltamivir, mometasone, and benzonatate. I am also having her start on amoxicillin-clavulanate. Additionally, I am having her maintain her omeprazole and metoprolol tartrate.  Meds ordered this encounter  Medications  . amoxicillin-clavulanate (AUGMENTIN) 875-125 MG tablet    Sig: Take 1 tablet by mouth 2 (two) times daily. Take all of this medication    Dispense:  20 tablet    Refill:  0     Follow-up: Return if symptoms worsen or fail to improve.  Claretta Fraise, M.D.

## 2016-07-16 ENCOUNTER — Telehealth: Payer: Self-pay | Admitting: Pediatrics

## 2016-07-16 NOTE — Telephone Encounter (Signed)
Patient was offered appointment on Monday 07/20/16 but patient states she cannot come that day because of school schedule.  Patient will call back in April to make appointment for Mirena insertion.  Advised patient to call us the day she starts her menstrual cycle.

## 2016-07-16 NOTE — Telephone Encounter (Signed)
Patient started menstrual cycle yesterday. Do we have mirena for patient. Please call to schedule appointment for insertion.

## 2016-07-23 ENCOUNTER — Ambulatory Visit (INDEPENDENT_AMBULATORY_CARE_PROVIDER_SITE_OTHER): Payer: BLUE CROSS/BLUE SHIELD | Admitting: Family Medicine

## 2016-07-23 ENCOUNTER — Encounter: Payer: Self-pay | Admitting: Family Medicine

## 2016-07-23 VITALS — BP 108/78 | HR 92 | Temp 98.8°F | Resp 18 | Ht 69.0 in | Wt 282.0 lb

## 2016-07-23 DIAGNOSIS — K219 Gastro-esophageal reflux disease without esophagitis: Secondary | ICD-10-CM | POA: Diagnosis not present

## 2016-07-23 DIAGNOSIS — I10 Essential (primary) hypertension: Secondary | ICD-10-CM | POA: Diagnosis not present

## 2016-07-23 DIAGNOSIS — Z7689 Persons encountering health services in other specified circumstances: Secondary | ICD-10-CM | POA: Diagnosis not present

## 2016-07-23 LAB — CBC
HEMATOCRIT: 38.2 % (ref 35.0–45.0)
HEMOGLOBIN: 12.2 g/dL (ref 11.7–15.5)
MCH: 27.4 pg (ref 27.0–33.0)
MCHC: 31.9 g/dL — ABNORMAL LOW (ref 32.0–36.0)
MCV: 85.8 fL (ref 80.0–100.0)
Platelets: 118 10*3/uL — ABNORMAL LOW (ref 140–400)
RBC: 4.45 MIL/uL (ref 3.80–5.10)
RDW: 14.4 % (ref 11.0–15.0)
WBC: 7.4 10*3/uL (ref 3.8–10.8)

## 2016-07-23 MED ORDER — FLUCONAZOLE 150 MG PO TABS: 150.0000 mg | ORAL_TABLET | Freq: Once | ORAL | 0 refills | Status: AC

## 2016-07-23 NOTE — Patient Instructions (Signed)
I have ordered blood tests I will send you a letter with your test results.  If there is anything of concern, we will call right away.  Eat well and try to get more exercise  Go to GYN for your birth control  See me in six months Call sooner for problems

## 2016-07-23 NOTE — Progress Notes (Signed)
Chief Complaint  Patient presents with  . Establish Care   Feels well Wants to come here because family does Has well controlled BP Compliant with medicine Has GERD asymptomatic on omeprazole Is obese.  Discussed weight loss.  Discussed health risk associated with her weight.  Offered assistance, nutrition consult. Patient declines. Regular eye visits Regular dental care  Patient Active Problem List   Diagnosis Date Noted  . GERD without esophagitis 07/23/2016  . Essential hypertension 10/03/2015  . Morbid obesity (HCC) 10/03/2015    Outpatient Encounter Prescriptions as of 07/23/2016  Medication Sig  . metoprolol tartrate (LOPRESSOR) 25 MG tablet TAKE 1 TABLET (25 MG TOTAL) BY MOUTH 2 (TWO) TIMES DAILY. (Patient taking differently: Take 25 mg by mouth daily. )  . omeprazole (PRILOSEC) 20 MG capsule TAKE ONE CAPSULE BY MOUTH EVERY DAY  . fluconazole (DIFLUCAN) 150 MG tablet Take 1 tablet (150 mg total) by mouth once.   No facility-administered encounter medications on file as of 07/23/2016.     Past Medical History:  Diagnosis Date  . GERD (gastroesophageal reflux disease)   . Hypertension     History reviewed. No pertinent surgical history.  Social History   Social History  . Marital status: Single    Spouse name: N/A  . Number of children: 0  . Years of education: 15   Occupational History  . 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 Smoking status: Never Smoker  . Smokeless tobacco: Never Used  . Alcohol use No  . Drug use: No  . Sexual activity: No   Other Topics Concern  . Not on file   Social History Narrative   Lives with grandmother and grandfather (paternal)   Works    In college   Interested in Horticulturist, commercialbeing athletic trainer    Family History  Problem Relation Age of Onset  . Asthma Mother   . Hypertension Mother   . Asthma Father   . Hypertension Father   . COPD Maternal Grandmother   . COPD Maternal  Grandfather   . Hypertension Paternal Grandmother   . Hypertension Paternal Grandfather     Review of Systems  Constitutional: Negative for chills, fever and weight loss.  HENT: Negative for congestion and hearing loss.   Eyes: Negative for blurred vision and pain.  Respiratory: Negative for cough and shortness of breath.   Cardiovascular: Negative for chest pain and leg swelling.  Gastrointestinal: Negative for abdominal pain, constipation, diarrhea and heartburn.  Genitourinary: Negative for dysuria and frequency.       Current vaginal itch after taking antibiotics  Musculoskeletal: Negative for falls, joint pain and myalgias.  Neurological: Negative for dizziness, seizures and headaches.  Psychiatric/Behavioral: Negative for depression. The patient is not nervous/anxious and does not have insomnia.     BP 108/78 (BP Location: Right Arm, Patient Position: Sitting, Cuff Size: Normal)   Pulse 92   Temp 98.8 F (37.1 C) (Temporal)   Resp 18   Ht 5\' 9"  (1.753 m)   Wt 282 lb 0.6 oz (127.9 kg)   LMP 07/16/2016 (Exact Date)   SpO2 99%   BMI 41.65 kg/m   Physical Exam  Constitutional: She is oriented to person, place, and time. She appears well-developed and well-nourished.  HENT:  Head: Normocephalic and atraumatic.  Right Ear: External ear normal.  Left Ear: External ear normal.  Mouth/Throat: Oropharynx is clear and moist.  Eyes: Conjunctivae are  normal. Pupils are equal, round, and reactive to light.  Neck: Normal range of motion. Neck supple. No thyromegaly present.  Cardiovascular: Normal rate, regular rhythm and normal heart sounds.   Pulmonary/Chest: Effort normal and breath sounds normal. No respiratory distress.  Abdominal: Soft. Bowel sounds are normal.  Musculoskeletal: Normal range of motion. She exhibits no edema.  Lymphadenopathy:    She has no cervical adenopathy.  Neurological: She is alert and oriented to person, place, and time.  Gait normal  Skin: Skin is  warm and dry.  Psychiatric: She has a normal mood and affect. Her behavior is normal. Thought content normal.  Nursing note and vitals reviewed.   1. Encounter to establish care with new doctor   2. Essential hypertension  - CBC - Comprehensive metabolic panel - Lipid panel - Urinalysis, Routine w reflex microscopic  3. Morbid obesity Discussed diet/exercise   Patient Instructions  I have ordered blood tests I will send you a letter with your test results.  If there is anything of concern, we will call right away.  Eat well and try to get more exercise  Go to GYN for your birth control  See me in six months Call sooner for problems      Eustace Moore, MD

## 2016-07-24 LAB — URINALYSIS, ROUTINE W REFLEX MICROSCOPIC
Bilirubin Urine: NEGATIVE
Glucose, UA: NEGATIVE
HGB URINE DIPSTICK: NEGATIVE
Ketones, ur: NEGATIVE
Leukocytes, UA: NEGATIVE
NITRITE: NEGATIVE
Protein, ur: NEGATIVE
SPECIFIC GRAVITY, URINE: 1.019 (ref 1.001–1.035)
pH: 7 (ref 5.0–8.0)

## 2016-07-24 LAB — COMPREHENSIVE METABOLIC PANEL
ALBUMIN: 4.1 g/dL (ref 3.6–5.1)
ALT: 23 U/L (ref 6–29)
AST: 24 U/L (ref 10–30)
Alkaline Phosphatase: 62 U/L (ref 33–115)
BUN: 7 mg/dL (ref 7–25)
CALCIUM: 9.5 mg/dL (ref 8.6–10.2)
CHLORIDE: 105 mmol/L (ref 98–110)
CO2: 24 mmol/L (ref 20–31)
CREATININE: 0.64 mg/dL (ref 0.50–1.10)
Glucose, Bld: 89 mg/dL (ref 65–99)
Potassium: 3.7 mmol/L (ref 3.5–5.3)
Sodium: 140 mmol/L (ref 135–146)
Total Bilirubin: 0.7 mg/dL (ref 0.2–1.2)
Total Protein: 7.8 g/dL (ref 6.1–8.1)

## 2016-07-24 LAB — LIPID PANEL
Cholesterol: 169 mg/dL (ref <200)
HDL: 46 mg/dL — ABNORMAL LOW (ref >50)
LDL Cholesterol: 78 mg/dL (ref <100)
Total CHOL/HDL Ratio: 3.7 Ratio (ref <5.0)
Triglycerides: 225 mg/dL — ABNORMAL HIGH (ref <150)
VLDL: 45 mg/dL — ABNORMAL HIGH (ref <30)

## 2016-09-03 ENCOUNTER — Telehealth: Payer: Self-pay | Admitting: Family Medicine

## 2016-09-03 NOTE — Telephone Encounter (Signed)
Pt requesting appt for Mirena Pt has transferred care to [facility] Informed pt she cannot see 2 PCPs She will call GYN to schedule appt for Mirena

## 2016-09-09 ENCOUNTER — Encounter: Payer: Self-pay | Admitting: Family Medicine

## 2016-09-09 ENCOUNTER — Telehealth: Payer: Self-pay

## 2016-09-09 NOTE — Telephone Encounter (Signed)
[email or web address]

## 2016-09-11 ENCOUNTER — Encounter: Payer: Self-pay | Admitting: Family Medicine

## 2016-09-11 ENCOUNTER — Ambulatory Visit (INDEPENDENT_AMBULATORY_CARE_PROVIDER_SITE_OTHER): Payer: BLUE CROSS/BLUE SHIELD | Admitting: Family Medicine

## 2016-09-11 VITALS — BP 110/70 | HR 72 | Temp 98.4°F | Resp 18 | Ht 69.0 in | Wt 277.0 lb

## 2016-09-11 DIAGNOSIS — R609 Edema, unspecified: Secondary | ICD-10-CM

## 2016-09-11 DIAGNOSIS — J3089 Other allergic rhinitis: Secondary | ICD-10-CM | POA: Diagnosis not present

## 2016-09-11 MED ORDER — OMEPRAZOLE 20 MG PO CPDR: 20.0000 mg | DELAYED_RELEASE_CAPSULE | Freq: Every day | ORAL | 3 refills | Status: DC

## 2016-09-11 NOTE — Progress Notes (Signed)
    Chief Complaint  Patient presents with  . Follow-up    wants ent referral   Called yesterday for acute appt For about 2 months in the evenings her hands feel tight and swollen. The more use, the more tight feeling.  Some pain in DIP joints.  No swelling of joints.  No rash.  No numbness.  No loss of dexterity,  Generally feels not as "strong" in grip.  No  Other joints involved.  Not related to salt intake  Not related to menstrual cycle. Also complains of nasal congestion and irritation not improving with flonase and nasal saline.  Wishes referral to ENT Patient Active Problem List   Diagnosis Date Noted  . GERD without esophagitis 07/23/2016  . Essential hypertension 10/03/2015  . Morbid obesity ([place]) 10/03/2015    Outpatient Encounter Prescriptions as of 09/11/2016  Medication Sig  . metoprolol tartrate (LOPRESSOR) 25 MG tablet TAKE 1 TABLET (25 MG TOTAL) BY MOUTH 2 (TWO) TIMES DAILY. (Patient taking differently: Take 25 mg by mouth daily. )  . omeprazole (PRILOSEC) 20 MG capsule Take 1 capsule (20 mg total) by mouth daily.   No facility-administered encounter medications on file as of 09/11/2016.     No Known Allergies  Review of Systems  Constitutional: Negative for fatigue and unexpected weight change.  Respiratory: Negative for cough and shortness of breath.   Cardiovascular: Negative for chest pain, palpitations and leg swelling.  Gastrointestinal: Negative for abdominal distention.  Musculoskeletal: Positive for arthralgias.       Hands " swollen"  Skin: Negative for color change.    BP 110/70 (BP Location: Right Arm, Patient Position: Sitting, Cuff Size: Large)   Pulse 72   Temp 98.4 F (36.9 C) (Temporal)   Resp 18   Ht '5\' 9"'$  (1.753 m)   Wt 277 lb 0.6 oz (125.7 kg)   LMP 09/02/2016 (Exact Date)   SpO2 100%   BMI 40.91 kg/m   Physical Exam  Constitutional: She is oriented to person, place, and time. She appears well-developed and well-nourished. No  distress.  HENT:  Head: Normocephalic and atraumatic.  Right Ear: External ear normal.  Left Ear: External ear normal.  Nose: Nose normal.  Mouth/Throat: Oropharynx is clear and moist.  Eyes: Conjunctivae are normal. Pupils are equal, round, and reactive to light.  Neck: Normal range of motion. Neck supple.  Musculoskeletal: Normal range of motion. She exhibits no edema.  upper extremities show normal strength, sensation, ROM and reflexes.  Phalen and Tinel negative.  No joint swelling or synovitis.  Lymphadenopathy:    She has no cervical adenopathy.  Neurological: She is alert and oriented to person, place, and time.  Psychiatric: She has a normal mood and affect. Her behavior is normal.    ASSESSMENT/PLAN:  1. Edema, unspecified type Hands only - Rheumatoid Factor - Sed Rate (ESR) - DG Hand Complete Left; Future - DG Hand Complete Right; Future  2. Seasonal allergic rhinitis due to other allergic trigger  - Ambulatory referral to ENT   Patient Instructions  Blood work ordered Hand x rays ordered Referral to ENT doctor I will let you know the result Watch salt in diet   Raylene Everts, MD

## 2016-09-11 NOTE — Patient Instructions (Signed)
Blood work ordered Hand x rays ordered Referral to ENT doctor I will let you know the result Watch salt in diet

## 2016-09-12 LAB — SEDIMENTATION RATE: Sed Rate: 24 mm/hr — ABNORMAL HIGH (ref 0–20)

## 2016-09-14 LAB — RHEUMATOID FACTOR

## 2016-10-12 ENCOUNTER — Ambulatory Visit (INDEPENDENT_AMBULATORY_CARE_PROVIDER_SITE_OTHER): Payer: BLUE CROSS/BLUE SHIELD | Admitting: Otolaryngology

## 2016-10-12 DIAGNOSIS — J342 Deviated nasal septum: Secondary | ICD-10-CM | POA: Diagnosis not present

## 2016-10-12 DIAGNOSIS — J343 Hypertrophy of nasal turbinates: Secondary | ICD-10-CM

## 2016-10-12 DIAGNOSIS — J310 Chronic rhinitis: Secondary | ICD-10-CM | POA: Diagnosis not present

## 2016-10-17 ENCOUNTER — Emergency Department (HOSPITAL_COMMUNITY)
Admission: EM | Admit: 2016-10-17 | Discharge: 2016-10-17 | Disposition: A | Discharge status: Home / Self Care | Payer: BLUE CROSS/BLUE SHIELD | Attending: Emergency Medicine | Admitting: Emergency Medicine

## 2016-10-17 ENCOUNTER — Encounter (HOSPITAL_COMMUNITY): Payer: Self-pay

## 2016-10-17 DIAGNOSIS — I10 Essential (primary) hypertension: Secondary | ICD-10-CM | POA: Insufficient documentation

## 2016-10-17 DIAGNOSIS — J329 Chronic sinusitis, unspecified: Secondary | ICD-10-CM | POA: Insufficient documentation

## 2016-10-17 DIAGNOSIS — J029 Acute pharyngitis, unspecified: Secondary | ICD-10-CM | POA: Insufficient documentation

## 2016-10-17 MED ORDER — LORATADINE-PSEUDOEPHEDRINE ER 5-120 MG PO TB12: 1.0000 | ORAL_TABLET | Freq: Two times a day (BID) | ORAL | 0 refills | Status: DC

## 2016-10-17 MED ORDER — DEXAMETHASONE 4 MG PO TABS: 4.0000 mg | ORAL_TABLET | Freq: Two times a day (BID) | ORAL | 0 refills | Status: DC

## 2016-10-17 MED ORDER — AMOXICILLIN 500 MG PO CAPS: 500.0000 mg | ORAL_CAPSULE | Freq: Three times a day (TID) | ORAL | 0 refills | Status: DC

## 2016-10-17 NOTE — ED Notes (Signed)
Pt reports sore throat for the last 3 days- She reports also seasonal allergies of which she takes no OTC meds- She has taken tylenol and ibuprofen which has caused her reported headache to resolve, but her throat remains sore

## 2016-10-17 NOTE — ED Provider Notes (Signed)
[facility]EM[facility]Y DEPT Provider Note   CSN: [id number] Arrival date & time: 10/17/16  1528     History   Chief Complaint Chief Complaint  Patient presents with  . Sore Throat    HPI Amy Nichols is a 21 y.o. female.  Patient is a 21 year old female who presents to the emergency department with a complaint of sore throat.  The patient gives history of 3 days of sore throat, congestion, and body aches. She has not had any vomiting or diarrhea reported. No unusual rash. She has not measured a temperature elevation, but states she has had some chills. She does not recall being around anyone his been ill. She has not been out of the country recently. She has no medical issues that should assault her immune system.        Past Medical History:  Diagnosis Date  . GERD (gastroesophageal reflux disease)   . Hypertension     Patient Active Problem List   Diagnosis Date Noted  . GERD without esophagitis 07/23/2016  . Essential hypertension 10/03/2015  . Morbid obesity (HCC) 10/03/2015    History reviewed. No pertinent surgical history.  OB History    No data available       Home Medications    Prior to Admission medications   Medication Sig Start Date End Date Taking? Authorizing Provider  metoprolol tartrate (LOPRESSOR) 25 MG tablet TAKE 1 TABLET (25 MG TOTAL) BY MOUTH 2 (TWO) TIMES DAILY. Patient taking differently: Take 25 mg by mouth daily.  01/16/16   Mechele ClaudeStacks, Warren, MD  omeprazole (PRILOSEC) 20 MG capsule Take 1 capsule (20 mg total) by mouth daily. 09/11/16   Eustace MooreNelson, Yvonne Sue, MD    Family History Family History  Problem Relation Age of Onset  . Asthma Mother   . Hypertension Mother   . Asthma Father   . Hypertension Father   . COPD Maternal Grandmother   . COPD Maternal Grandfather   . Hypertension Paternal Grandmother   . Hypertension Paternal Grandfather     Social History Social History  Substance Use Topics  . Smoking status: Never Smoker  .  Smokeless tobacco: Never Used  . Alcohol use No     Allergies   Patient has no known allergies.   Review of Systems Review of Systems  Constitutional: Positive for appetite change and chills. Negative for activity change.  HENT: Positive for congestion, sinus pressure and sore throat. Negative for ear discharge, ear pain, facial swelling, nosebleeds, rhinorrhea, sneezing and tinnitus.   Eyes: Negative for photophobia, pain and discharge.  Respiratory: Negative for cough, choking, shortness of breath and wheezing.   Cardiovascular: Negative for chest pain, palpitations and leg swelling.  Gastrointestinal: Negative for abdominal pain, blood in stool, constipation, diarrhea, nausea and vomiting.  Genitourinary: Negative for difficulty urinating, dysuria, flank pain, frequency and hematuria.  Musculoskeletal: Positive for myalgias. Negative for back pain, gait problem and neck pain.  Skin: Negative for color change, rash and wound.  Neurological: Negative for dizziness, seizures, syncope, facial asymmetry, speech difficulty, weakness and numbness.  Hematological: Negative for adenopathy. Does not bruise/bleed easily.  Psychiatric/Behavioral: Negative for agitation, confusion, hallucinations, self-injury and suicidal ideas. The patient is not nervous/anxious.      Physical Exam Updated Vital Signs BP (!) 144/83 (BP Location: Right Arm)   Pulse 93   Temp 98.4 F (36.9 C)   Resp 18   Ht 5\' 9"  (1.753 m)   Wt 125.6 kg (277 lb)   LMP 09/29/2016  SpO2 99%   BMI 40.91 kg/m   Physical Exam  Constitutional: Vital signs are normal. She appears well-developed and well-nourished. She is active.  HENT:  Head: Normocephalic and atraumatic.  Right Ear: Tympanic membrane, external ear and ear canal normal.  Left Ear: Tympanic membrane, external ear and ear canal normal.  Nose: Nose normal.  Mouth/Throat: Uvula is midline, oropharynx is clear and moist and mucous membranes are normal.    There is increased redness of the posterior pharynx. The airway is patent. The uvula is in the midline. There is mild-to-moderate swelling of the tonsillar area.  There is nasal congestion present. The patient complains of pressure in the sinus area, but there is no pain to percussion of the sinuses.  Eyes: Conjunctivae, EOM and lids are normal. Pupils are equal, round, and reactive to light.  Neck: Trachea normal, normal range of motion and phonation normal. Neck supple. Carotid bruit is not present.  Cardiovascular: Normal rate, regular rhythm and normal pulses.   Abdominal: Soft. Normal appearance and bowel sounds are normal.  Lymphadenopathy:       Head (right side): No submental, no preauricular and no posterior auricular adenopathy present.       Head (left side): No submental, no preauricular and no posterior auricular adenopathy present.    She has no cervical adenopathy.  Neurological: She is alert. She has normal strength. No cranial nerve deficit or sensory deficit. GCS eye subscore is 4. GCS verbal subscore is 5. GCS motor subscore is 6.  Skin: Skin is warm and dry.  No rash the palms of the hands. No other rash noted.  Psychiatric: Her speech is normal.     ED Treatments / Results  Labs (all labs ordered are listed, but only abnormal results are displayed) Labs Reviewed - No data to display  EKG  EKG Interpretation None       Radiology No results found.  Procedures Procedures (including critical care time)  Medications Ordered in ED Medications - No data to display   Initial Impression / Assessment and Plan / ED Course  I have reviewed the triage vital signs and the nursing notes.  Pertinent labs & imaging results that were available during my care of the patient were reviewed by me and considered in my medical decision making (see chart for details).      Final Clinical Impressions(s) / ED Diagnoses MDM Vital signs within normal limits. The  examination favors a pharyngitis and sinusitis. Patient will be treated with Amoxil and Claritin-D as well as Tylenol every 4 hours, or ibuprofen every 6 hours. We discussed the importance of good handwashing. We discussed the importance of good hydration. I provided a mask for the patient. She will follow-up with her primary physician if not improving.    Final diagnoses:  Acute pharyngitis, unspecified etiology  Sinusitis, unspecified chronicity, unspecified location    New Prescriptions New Prescriptions   No medications on file     Ivery Quale, Cordelia Poche 10/17/16 1630    Donnetta Hutching, MD 10/18/16 1735

## 2016-10-17 NOTE — ED Triage Notes (Signed)
Sore throat, cough and bilateral ear pain x3 days.

## 2016-10-17 NOTE — Discharge Instructions (Signed)
Saltwater gargles maybe helpful. Please wash hands frequently. Usual manner until symptoms have resolved. Please increase fluids. Use Amoxil 3 times daily, Decadron 2 times daily and Claritin-D 2 times daily. Please see your primary physician or return to the emergency department if not improving.

## 2016-10-29 ENCOUNTER — Encounter: Payer: Self-pay | Admitting: Family Medicine

## 2016-10-29 ENCOUNTER — Ambulatory Visit (INDEPENDENT_AMBULATORY_CARE_PROVIDER_SITE_OTHER): Payer: BLUE CROSS/BLUE SHIELD | Admitting: Family Medicine

## 2016-10-29 VITALS — BP 118/74 | HR 88 | Temp 97.7°F | Resp 16 | Ht 69.0 in | Wt 279.0 lb

## 2016-10-29 DIAGNOSIS — J029 Acute pharyngitis, unspecified: Secondary | ICD-10-CM

## 2016-10-29 DIAGNOSIS — J329 Chronic sinusitis, unspecified: Secondary | ICD-10-CM | POA: Diagnosis not present

## 2016-10-29 DIAGNOSIS — I10 Essential (primary) hypertension: Secondary | ICD-10-CM

## 2016-10-29 LAB — POCT RAPID STREP A (OFFICE): Rapid Strep A Screen: NEGATIVE

## 2016-10-29 MED ORDER — LISINOPRIL 5 MG PO TABS: 5.0000 mg | ORAL_TABLET | Freq: Every day | ORAL | 3 refills | Status: DC

## 2016-10-29 MED ORDER — AMOXICILLIN-POT CLAVULANATE 875-125 MG PO TABS: 1.0000 | ORAL_TABLET | Freq: Two times a day (BID) | ORAL | 0 refills | Status: DC

## 2016-10-29 MED ORDER — FLUTICASONE PROPIONATE 50 MCG/ACT NA SUSP: 2.0000 | Freq: Every day | NASAL | 6 refills | Status: DC

## 2016-10-29 NOTE — Patient Instructions (Signed)
Use flonase daily Take 10 d of antibiotic Report any problems Stop the metoprolol Take the lisinopril daily Take your BP  Daily For the next couple of weeks Let me know if the lisinopril does not work for you or causes any side effects

## 2016-10-29 NOTE — Progress Notes (Signed)
Chief Complaint  Patient presents with  . Follow-up    [facility] sore throat   Seen for sore throat in ER.  Notes are reviewed and discussed. Treated with 7 d of amoxil and 6 d of decadron Still has some sinus pressure and severe ST No testing done history of allergies and recurrent infections/tubes as a child Does not like her BP med.  Takes at night due to drowsiness and dizziness.  Will try change to lisinopril.   Patient Active Problem List   Diagnosis Date Noted  . GERD without esophagitis 07/23/2016  . Essential hypertension 10/03/2015  . Morbid obesity (HCC) 10/03/2015    Outpatient Encounter Prescriptions as of 10/29/2016  Medication Sig  . omeprazole (PRILOSEC) 20 MG capsule Take 1 capsule (20 mg total) by mouth daily.  . [DISCONTINUED] metoprolol tartrate (LOPRESSOR) 25 MG tablet TAKE 1 TABLET (25 MG TOTAL) BY MOUTH 2 (TWO) TIMES DAILY. (Patient taking differently: Take 25 mg by mouth daily. )  . amoxicillin-clavulanate (AUGMENTIN) 875-125 MG tablet Take 1 tablet by mouth 2 (two) times daily.  . fluticasone (FLONASE) 50 MCG/ACT nasal spray Place 2 sprays into both nostrils daily.  Marland Kitchen. lisinopril (PRINIVIL,ZESTRIL) 5 MG tablet Take 1 tablet (5 mg total) by mouth daily.   No facility-administered encounter medications on file as of 10/29/2016.     No Known Allergies  Review of Systems  Constitutional: Positive for fatigue. Negative for activity change, appetite change and unexpected weight change.  HENT: Positive for congestion, sinus pressure and sore throat. Negative for postnasal drip and rhinorrhea.   Eyes: Negative for photophobia and visual disturbance.  Respiratory: Negative for cough and shortness of breath.   Cardiovascular: Negative for chest pain, palpitations and leg swelling.  Gastrointestinal: Negative for diarrhea and nausea.  Genitourinary: Negative for difficulty urinating and frequency.  Musculoskeletal: Negative for arthralgias and myalgias.    Neurological: Positive for light-headedness and headaches.  Hematological: Negative for adenopathy. Does not bruise/bleed easily.  Psychiatric/Behavioral: Negative.  Negative for sleep disturbance.    BP 118/74 (BP Location: Right Arm, Patient Position: Sitting, Cuff Size: Large)   Pulse 88   Temp 97.7 F (36.5 C) (Temporal)   Resp 16   Ht 5\' 9"  (1.753 m)   Wt 279 lb 0.6 oz (126.6 kg)   LMP 09/29/2016   SpO2 99%   BMI 41.21 kg/m   Physical Exam  Constitutional: She is oriented to person, place, and time. She appears well-developed and well-nourished.  Mildly ill, fatigued  HENT:  Head: Normocephalic and atraumatic.  Right Ear: External ear normal.  Left Ear: External ear normal.  Mouth/Throat: Oropharynx is clear and moist. No oropharyngeal exudate.  Tonsils mildly enlarged and injected.  No exudate.  Strep NEG.  Both TMs with scar.  Mild sinus tenderness  Eyes: Conjunctivae are normal. Pupils are equal, round, and reactive to light.  Neck: Normal range of motion. Neck supple. No thyromegaly present.  Cardiovascular: Normal rate, regular rhythm and normal heart sounds.   Pulmonary/Chest: Effort normal and breath sounds normal.  Musculoskeletal: Normal range of motion. She exhibits no edema.  Lymphadenopathy:    She has cervical adenopathy.  Neurological: She is alert and oriented to person, place, and time.  Psychiatric: She has a normal mood and affect. Her behavior is normal. Thought content normal.    ASSESSMENT/PLAN:  1. Sore throat  - POCT rapid strep A  2. Chronic sinusitis, unspecified location   3. Essential hypertension    Patient Instructions  Use flonase daily Take 10 d of antibiotic Report any problems Stop the metoprolol Take the lisinopril daily Take your BP  Daily For the next couple of weeks Let me know if the lisinopril does not work for you or causes any side effects   Eustace Moore, MD

## 2016-11-18 ENCOUNTER — Encounter: Payer: Self-pay | Admitting: Family Medicine

## 2016-11-18 ENCOUNTER — Telehealth: Payer: Self-pay | Admitting: Family Medicine

## 2016-11-18 NOTE — Telephone Encounter (Signed)
Patient came in @ 4:20 today requesting appt with Dr. Delton SeeNelson. First available is July 24th bc there are only same day appts available.  Patient states she has sent a message thru my chart to Dr. Delton SeeNelson to advise her on what to do with mouth swelling and sore/rash over lip that has had for 2 days.  She has not heard back yet.  Can you please advise on what she can do until seen or determine if this something that requires a referral to dermatologist.    Pt's contact [phone]

## 2016-11-19 NOTE — Telephone Encounter (Signed)
Patient scheduled and is aware of appt per D. Nardi.

## 2016-11-19 NOTE — Telephone Encounter (Signed)
She can have 1040 on the 17th

## 2016-11-24 ENCOUNTER — Ambulatory Visit (INDEPENDENT_AMBULATORY_CARE_PROVIDER_SITE_OTHER): Payer: BLUE CROSS/BLUE SHIELD | Admitting: Family Medicine

## 2016-11-24 ENCOUNTER — Encounter: Payer: Self-pay | Admitting: Family Medicine

## 2016-11-24 VITALS — BP 122/76 | HR 92 | Temp 98.5°F | Resp 16 | Ht 69.0 in | Wt 279.1 lb

## 2016-11-24 DIAGNOSIS — B001 Herpesviral vesicular dermatitis: Secondary | ICD-10-CM

## 2016-11-24 MED ORDER — VALACYCLOVIR HCL 1 G PO TABS: 1000.0000 mg | ORAL_TABLET | Freq: Two times a day (BID) | ORAL | 11 refills | Status: DC

## 2016-11-24 NOTE — Patient Instructions (Signed)
Take the valtrex  At the first sign of a break out Take one every 12 hours for 2 days  Keep in your cabinet- get refill early

## 2016-11-24 NOTE — Progress Notes (Signed)
    Chief Complaint  Patient presents with  . Facial Swelling    top right lip   Recurrent rash top lip 2 outbreaks in the last 6 months Start with tingle Then rash that crusts Last a couple of weeks Otherwise well  Patient Active Problem List   Diagnosis Date Noted  . GERD without esophagitis 07/23/2016  . Essential hypertension 10/03/2015  . Morbid obesity (HCC) 10/03/2015    Outpatient Encounter Prescriptions as of 11/24/2016  Medication Sig  . fluticasone (FLONASE) 50 MCG/ACT nasal spray Place 2 sprays into both nostrils daily.  Marland Kitchen. lisinopril (PRINIVIL,ZESTRIL) 5 MG tablet Take 1 tablet (5 mg total) by mouth daily.  Marland Kitchen. omeprazole (PRILOSEC) 20 MG capsule Take 1 capsule (20 mg total) by mouth daily.  . valACYclovir (VALTREX) 1000 MG tablet Take 1 tablet (1,000 mg total) by mouth 2 (two) times daily.   No facility-administered encounter medications on file as of 11/24/2016.     No Known Allergies  Review of Systems  Constitutional: Negative for chills and fever.  HENT: Negative for postnasal drip, rhinorrhea and sore throat.   Eyes: Negative for redness and visual disturbance.  Respiratory: Negative for cough and shortness of breath.   Gastrointestinal: Negative for nausea and vomiting.  Skin: Positive for rash.  Neurological: Negative for dizziness and headaches.  All other systems reviewed and are negative.   BP 122/76 (BP Location: Right Arm, Patient Position: Sitting, Cuff Size: Large)   Pulse 92   Temp 98.5 F (36.9 C) (Temporal)   Resp 16   Ht 5\' 9"  (1.753 m)   Wt 279 lb 1.9 oz (126.6 kg)   SpO2 98%   BMI 41.22 kg/m   Physical Exam  Constitutional: She appears well-developed and well-nourished. No distress.  obese  HENT:  Head: Normocephalic and atraumatic.  Right Ear: External ear normal.  Left Ear: External ear normal.  Nose: Nose normal.  Mouth/Throat: Oropharynx is clear and moist. No oropharyngeal exudate.    Hyperpigmented healing lesion,  fine scale  Eyes: Pupils are equal, round, and reactive to light. Conjunctivae are normal.  Neck: Normal range of motion.  Cardiovascular: Normal rate, regular rhythm and normal heart sounds.   Pulmonary/Chest: Effort normal and breath sounds normal.  Lymphadenopathy:    She has no cervical adenopathy.  Skin: Rash noted.  Psychiatric: She has a normal mood and affect. Her behavior is normal.    ASSESSMENT/PLAN:  1. 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

## 2017-01-25 ENCOUNTER — Ambulatory Visit: Payer: BLUE CROSS/BLUE SHIELD | Admitting: Family Medicine

## 2017-01-26 ENCOUNTER — Ambulatory Visit: Payer: BLUE CROSS/BLUE SHIELD | Admitting: Family Medicine

## 2017-01-28 ENCOUNTER — Encounter: Payer: Self-pay | Admitting: Family Medicine

## 2017-01-28 ENCOUNTER — Ambulatory Visit (INDEPENDENT_AMBULATORY_CARE_PROVIDER_SITE_OTHER): Payer: BLUE CROSS/BLUE SHIELD | Admitting: Family Medicine

## 2017-01-28 VITALS — BP 120/80 | HR 88 | Temp 98.2°F | Resp 16 | Ht 69.0 in | Wt 280.0 lb

## 2017-01-28 DIAGNOSIS — Z23 Encounter for immunization: Secondary | ICD-10-CM | POA: Diagnosis not present

## 2017-01-28 DIAGNOSIS — I10 Essential (primary) hypertension: Secondary | ICD-10-CM

## 2017-01-28 NOTE — Progress Notes (Signed)
    Chief Complaint  Patient presents with  . Follow-up    6 month   Still in college No change in health Takes BP medicine daily Has not lost weight Needs note for health dept to place IUD GERD controlled   Patient Active Problem List   Diagnosis Date Noted  . GERD without esophagitis 07/23/2016  . Essential hypertension 10/03/2015  . Morbid obesity (HCC) 10/03/2015    Outpatient Encounter Prescriptions as of 01/28/2017  Medication Sig  . lisinopril (PRINIVIL,ZESTRIL) 5 MG tablet Take 1 tablet (5 mg total) by mouth daily.  Marland Kitchen omeprazole (PRILOSEC) 20 MG capsule Take 1 capsule (20 mg total) by mouth daily.   No facility-administered encounter medications on file as of 01/28/2017.     No Known Allergies  Review of Systems  Constitutional: Negative for activity change, appetite change and unexpected weight change.  HENT: Negative for congestion, dental problem, postnasal drip and rhinorrhea.   Eyes: Negative for redness and visual disturbance.  Respiratory: Negative for cough and shortness of breath.   Cardiovascular: Negative for chest pain, palpitations and leg swelling.  Gastrointestinal: Negative for abdominal pain, constipation and diarrhea.  Genitourinary: Negative for difficulty urinating, frequency and menstrual problem.  Musculoskeletal: Negative for arthralgias and back pain.  Neurological: Negative for dizziness and headaches.  Psychiatric/Behavioral: Negative for dysphoric mood and sleep disturbance. The patient is not nervous/anxious.       BP 120/80 (BP Location: Right Arm, Patient Position: Sitting, Cuff Size: Large)   Pulse 88   Temp 98.2 F (36.8 C) (Temporal)   Resp 16   Ht  (1.753 m)   Wt 280 lb (127 kg)   LMP 01/14/2017 (Exact Date)   SpO2 98%   BMI 41.35 kg/m   Physical Exam  Constitutional: She is oriented to person, place, and time. She appears well-developed and well-nourished.  HENT:  Head: Normocephalic and atraumatic.  Right  Ear: External ear normal.  Left Ear: External ear normal.  Mouth/Throat: Oropharynx is clear and moist.  Eyes: Pupils are equal, round, and reactive to light. Conjunctivae are normal.  Neck: Normal range of motion. Neck supple. No thyromegaly present.  Cardiovascular: Normal rate, regular rhythm and normal heart sounds.   Pulmonary/Chest: Effort normal and breath sounds normal. No respiratory distress.  Abdominal: Soft. Bowel sounds are normal.  Musculoskeletal: Normal range of motion. She exhibits no edema.  Lymphadenopathy:    She has no cervical adenopathy.  Neurological: She is alert and oriented to person, place, and time.  Gait normal.  DTR trace at knee  Skin: Skin is warm and dry.  Psychiatric: She has a normal mood and affect. Her behavior is normal. Thought content normal.  Nursing note and vitals reviewed.   ASSESSMENT/PLAN:  1. Essential hypertension - CBC - COMPLETE METABOLIC PANEL WITH GFR - Lipid panel - Urinalysis, Routine w reflex microscopic  2. Morbid obesity (HCC)  3. Need for influenza vaccination - Flu Vaccine QUAD 36+ mos IM   Patient Instructions  You are doing well Try to walk/exercise daily No change in medicines See me in six months Labs next time Call sooner for problems   Eustace Moore, MD

## 2017-01-28 NOTE — Patient Instructions (Signed)
You are doing well Try to walk/exercise daily No change in medicines See me in six months Labs next time Call sooner for problems

## 2017-06-17 ENCOUNTER — Ambulatory Visit: Payer: BLUE CROSS/BLUE SHIELD | Admitting: Family Medicine

## 2017-06-22 ENCOUNTER — Encounter: Payer: Self-pay | Admitting: Family Medicine

## 2017-06-22 ENCOUNTER — Other Ambulatory Visit: Payer: Self-pay

## 2017-06-22 ENCOUNTER — Ambulatory Visit (INDEPENDENT_AMBULATORY_CARE_PROVIDER_SITE_OTHER): Payer: BLUE CROSS/BLUE SHIELD | Admitting: Family Medicine

## 2017-06-22 VITALS — BP 116/68 | HR 76 | Temp 98.1°F | Resp 18 | Ht 69.0 in | Wt 277.1 lb

## 2017-06-22 DIAGNOSIS — R6889 Other general symptoms and signs: Secondary | ICD-10-CM

## 2017-06-22 LAB — POCT INFLUENZA A/B
INFLUENZA A, POC: NEGATIVE
INFLUENZA B, POC: NEGATIVE

## 2017-06-22 MED ORDER — BENZONATATE 200 MG PO CAPS: 200.0000 mg | ORAL_CAPSULE | Freq: Two times a day (BID) | ORAL | 0 refills | Status: DC | PRN

## 2017-06-22 NOTE — Patient Instructions (Signed)
Take mucinex DM every 12 hours In addition take the tessalon 2-3 times a day Push fluids Call or e mail if not feeling improvement by Friday

## 2017-06-22 NOTE — Progress Notes (Signed)
    Chief Complaint  Patient presents with  . URI  Patient has had cough cold runny nose for 4 days.  Mild sputum.  Some feeling of wheezing.  No chest pain.  No sweats or chills.  No fever.  Mild fatigue.  No malaise.  She has clear runny nose mucus.  She is coughing up clear sputum.  Mild headache.  No sore throat.  No sinus pressure or pain.  She has not use over-the-counter medicines. She states that she is coughing and feels achy her than her usual cold symptoms.  She was exposed to influenza.   Patient Active Problem List   Diagnosis Date Noted  . GERD without esophagitis 07/23/2016  . Essential hypertension 10/03/2015  . Morbid obesity (HCC) 10/03/2015    Outpatient Encounter Medications as of 06/22/2017  Medication Sig  . lisinopril (PRINIVIL,ZESTRIL) 5 MG tablet Take 1 tablet (5 mg total) by mouth daily.  Marland Kitchen. omeprazole (PRILOSEC) 20 MG capsule Take 1 capsule (20 mg total) by mouth daily.  . benzonatate (TESSALON) 200 MG capsule Take 1 capsule (200 mg total) by mouth 2 (two) times daily as needed for cough.   No facility-administered encounter medications on file as of 06/22/2017.     No Known Allergies  Review of Systems  Constitutional: Positive for fatigue. Negative for activity change, chills and fever.  HENT: Positive for congestion, postnasal drip and rhinorrhea. Negative for sinus pressure, sore throat and trouble swallowing.   Eyes: Negative for redness and visual disturbance.  Respiratory: Positive for cough. Negative for shortness of breath and wheezing.   Cardiovascular: Negative for chest pain and palpitations.  Gastrointestinal: Negative for constipation, diarrhea and vomiting.  Musculoskeletal: Negative for arthralgias and back pain.  Neurological: Negative for dizziness and headaches.    BP 116/68 (BP Location: Left Arm, Patient Position: Sitting, Cuff Size: Large)   Pulse 76   Temp 98.1 F (36.7 C) (Temporal)   Resp 18   Ht 5\' 9"  (1.753 m)   Wt 277 lb  1.9 oz (125.7 kg)   BMI 40.92 kg/m   Physical Exam  Constitutional: She is oriented to person, place, and time. She appears well-developed and well-nourished.  Mildly ill, fatigued  HENT:  Head: Normocephalic and atraumatic.  Right Ear: External ear normal.  Left Ear: External ear normal.  Mouth/Throat: Oropharynx is clear and moist. No oropharyngeal exudate.  Tonsils mildly enlarged and injected.  No exudate.  Tongue is bright pink from a cough drop .  Both TMs with scar.  No sinus tenderness  Eyes: Conjunctivae are normal. Pupils are equal, round, and reactive to light.  Neck: Normal range of motion. Neck supple. No thyromegaly present.  Cardiovascular: Normal rate, regular rhythm and normal heart sounds.  Pulmonary/Chest: Effort normal and breath sounds normal.  Lungs are clear  Musculoskeletal: Normal range of motion. She exhibits no edema.  Lymphadenopathy:    She has cervical adenopathy.  Neurological: She is alert and oriented to person, place, and time.  Psychiatric: She has a normal mood and affect. Her behavior is normal. Thought content normal.    ASSESSMENT/PLAN:  1. Flu-like symptoms Discussed the need to avoid antibiotics for viral illnesses.  Discussed symptomatic care. - POCT Influenza A/B= negative   Patient Instructions  Take mucinex DM every 12 hours In addition take the tessalon 2-3 times a day Push fluids Call or e mail if not feeling improvement by Friday   Eustace MooreYvonne Sue Sansa Alkema, MD

## 2017-06-25 ENCOUNTER — Encounter: Payer: Self-pay | Admitting: Family Medicine

## 2017-07-21 ENCOUNTER — Ambulatory Visit: Payer: BLUE CROSS/BLUE SHIELD | Admitting: Family Medicine

## 2017-07-21 ENCOUNTER — Encounter: Payer: Self-pay | Admitting: Family Medicine

## 2017-07-21 VITALS — BP 122/84 | HR 87 | Temp 98.3°F | Ht 69.0 in | Wt 275.0 lb

## 2017-07-21 DIAGNOSIS — E785 Hyperlipidemia, unspecified: Secondary | ICD-10-CM

## 2017-07-21 DIAGNOSIS — I10 Essential (primary) hypertension: Secondary | ICD-10-CM

## 2017-07-21 NOTE — Patient Instructions (Signed)
Eat a heart healthy diet Exercise as much as you are able  Call or return for problems  See us for yearly physicals

## 2017-07-21 NOTE — Progress Notes (Signed)
Chief Complaint  Patient presents with  . 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  She has another cold at this time with cough cold runny nose and mild sore throat.  Symptoms and only been for 2 days.  Mucus is clear.  She is fine with taking over-the-counter medicines. She is here for follow-up of her hypertension GERD and morbid obesity.  She tells me she continues to take omeprazole on a daily basis for her GERD and it works well for her.  She only takes her Norvasc if she feels bad.  She states that if she starts to feel like she has a headache or feels malaise she will take her blood pressure.  If her blood pressure is high she will take her Norvasc.  She states she is only taken 2 in the last 6 months.  Her blood pressures over the last year have consistently been good in the office.  I realize were usually taking them when she is not on Norvasc.  I question whether she needs to take Norvasc.  I told her that if she will continue to watch her diet, limit salt, and lose weight then she probably does not need blood pressure medication.  She states she has started going to a gym and she is lost 2 pounds since her last visit a month ago. In May she will finish her 2-year Associates degree.  She is then going to go on and get a bachelor's degree in criminal justice. She lives with her grandparents.  She is not involved in a relationship or sexually active.  She states that she does see GYN for her Pap smears and is due this year.  Patient Active Problem List   Diagnosis Date Noted  . GERD without esophagitis 07/23/2016  . Essential hypertension 10/03/2015  . Morbid obesity (HCC) 10/03/2015    Outpatient Encounter Medications as of 07/21/2017  Medication Sig  . lisinopril (PRINIVIL,ZESTRIL) 5 MG tablet Take 1 tablet (5 mg total) by mouth daily.  Marland Kitchen. omeprazole (PRILOSEC) 20 MG capsule Take 1 capsule (20  mg total) by mouth daily.   No facility-administered encounter medications on file as of 07/21/2017.     No Known Allergies  Review of Systems  Constitutional: Negative for activity change, chills, fatigue and fever.  HENT: Positive for congestion, postnasal drip and rhinorrhea. Negative for sinus pressure, sore throat and trouble swallowing.   Eyes: Negative for redness and visual disturbance.  Respiratory: Negative for cough, shortness of breath and wheezing.   Cardiovascular: Negative for chest pain and palpitations.  Gastrointestinal: Negative for constipation, diarrhea and vomiting.  Musculoskeletal: Negative for arthralgias and back pain.  Neurological: Negative for dizziness and headaches.    BP 122/84 (BP Location: Left Arm, Patient Position: Sitting, Cuff Size: Normal)   Pulse 87   Temp 98.3 F (36.8 C) (Temporal)   Ht 5\' 9"  (1.753 m)   Wt 275 lb (124.7 kg)   LMP 07/20/2017   SpO2 97%   BMI 40.61 kg/m   Physical Exam  Constitutional: She is oriented to person, place, and time. She appears well-developed and well-nourished.  HENT:  Head: Normocephalic and atraumatic.  Right Ear: External ear normal.  Left Ear: External ear normal.  Mouth/Throat: Oropharynx is clear and moist.  Nasal membranes congested, clear discharge  Eyes: Conjunctivae are normal. Pupils are equal, round, and reactive to light.  Neck: Normal range of motion. Neck supple. No thyromegaly present.  Cardiovascular: Normal rate, regular rhythm and normal heart sounds.  Pulmonary/Chest: Effort normal and breath sounds normal. No respiratory distress.  Abdominal: Soft. Bowel sounds are normal.  Musculoskeletal: Normal range of motion. She exhibits no edema.  Lymphadenopathy:    She has no cervical adenopathy.  Neurological: She is alert and oriented to person, place, and time.  Gait normal  Skin: Skin is warm and dry.  Psychiatric: She has a normal mood and affect. Her behavior is normal. Thought  content normal.  Nursing note and vitals reviewed.   ASSESSMENT/PLAN:  1. Hyperlipidemia, unspecified hyperlipidemia type Discussed that within a low HDL she may be at increased risk significant dyslipidemia as she gets older.  Advised to watch the fats and cholesterol in her diet.  Again reminded to exercise.  2. Essential hypertension Today I question this diagnosis.  She is been on medication for some time but apparently does not take it.  Her blood pressures have been well controlled.  At this point I told her to work on her diet and exercise, and not to take medicine daily.  Follow-up yearly.   Patient Instructions  Eat a heart healthy diet Exercise as much as you are able  Call or return for problems  See [name] for yearly physicals    Eustace Moore, MD

## 2017-08-16 ENCOUNTER — Ambulatory Visit (INDEPENDENT_AMBULATORY_CARE_PROVIDER_SITE_OTHER): Payer: BLUE CROSS/BLUE SHIELD | Admitting: Family Medicine

## 2017-08-16 ENCOUNTER — Encounter: Payer: Self-pay | Admitting: Family Medicine

## 2017-08-16 VITALS — BP 114/74 | HR 80 | Resp 16 | Ht 69.0 in | Wt 269.0 lb

## 2017-08-16 DIAGNOSIS — K5909 Other constipation: Secondary | ICD-10-CM

## 2017-08-16 DIAGNOSIS — K219 Gastro-esophageal reflux disease without esophagitis: Secondary | ICD-10-CM | POA: Diagnosis not present

## 2017-08-16 DIAGNOSIS — R1012 Left upper quadrant pain: Secondary | ICD-10-CM | POA: Diagnosis not present

## 2017-08-16 MED ORDER — POLYETHYLENE GLYCOL 3350 17 GM/SCOOP PO POWD
17.0000 g | Freq: Every day | ORAL | 1 refills | Status: DC
Start: 2017-08-16 — End: 2018-04-08

## 2017-08-16 MED ORDER — PANTOPRAZOLE SODIUM 20 MG PO TBEC: 20.0000 mg | DELAYED_RELEASE_TABLET | Freq: Every day | ORAL | 1 refills | Status: DC

## 2017-08-16 NOTE — Patient Instructions (Addendum)
Drink plenty of wter Take the miralax daily Titrate up or down depending on result Stop the omeprazole Start pantoprazole Increase fiber in diet  Let me know if not improving in 2 weeks High-Fiber Diet Fiber, also called dietary fiber, is a type of carbohydrate found in fruits, vegetables, whole grains, and beans. A high-fiber diet can have many health benefits. Your health care provider may recommend a high-fiber diet to help:  Prevent constipation. Fiber can make your bowel movements more regular.  Lower your cholesterol.  Relieve hemorrhoids, uncomplicated diverticulosis, or irritable bowel syndrome.  Prevent overeating as part of a weight-loss plan.  Prevent heart disease, type 2 diabetes, and certain cancers.  What is my plan? The recommended daily intake of fiber includes:  38 grams for men under age 30.  30 grams for men over age 41.  25 grams for women under age 5.  21 grams for women over age 78.  You can get the recommended daily intake of dietary fiber by eating a variety of fruits, vegetables, grains, and beans. Your health care provider may also recommend a fiber supplement if it is not possible to get enough fiber through your diet. What do I need to know about a high-fiber diet?  Fiber supplements have not been widely studied for their effectiveness, so it is better to get fiber through food sources.  Always check the fiber content on thenutrition facts label of any prepackaged food. Look for foods that contain at least 5 grams of fiber per serving.  Ask your dietitian if you have questions about specific foods that are related to your condition, especially if those foods are not listed in the following section.  Increase your daily fiber consumption gradually. Increasing your intake of dietary fiber too quickly may cause bloating, cramping, or gas.  Drink plenty of water. Water helps you to digest fiber. What foods can I eat? Grains Whole-grain breads.  Multigrain cereal. Oats and oatmeal. Brown rice. Barley. Bulgur wheat. Millet. Bran muffins. Popcorn. Rye wafer crackers. Vegetables Sweet potatoes. Spinach. Kale. Artichokes. Cabbage. Broccoli. Green peas. Carrots. Squash. Fruits Berries. Pears. Apples. Oranges. Avocados. Prunes and raisins. Dried figs. Meats and Other Protein Sources Navy, kidney, pinto, and soy beans. Split peas. Lentils. Nuts and seeds. Dairy Fiber-fortified yogurt. Beverages Fiber-fortified soy milk. Fiber-fortified orange juice. Other Fiber bars. The items listed above may not be a complete list of recommended foods or beverages. Contact your dietitian for more options. What foods are not recommended? Grains White bread. Pasta made with refined flour. White rice. Vegetables Fried potatoes. Canned vegetables. Well-cooked vegetables. Fruits Fruit juice. Cooked, strained fruit. Meats and Other Protein Sources Fatty cuts of meat. Fried Environmental education officer or fried fish. Dairy Milk. Yogurt. Cream cheese. Sour cream. Beverages Soft drinks. Other Cakes and pastries. Butter and oils. The items listed above may not be a complete list of foods and beverages to avoid. Contact your dietitian for more information. What are some tips for including high-fiber foods in my diet?  Eat a wide variety of high-fiber foods.  Make sure that half of all grains consumed each day are whole grains.  Replace breads and cereals made from refined flour or white flour with whole-grain breads and cereals.  Replace white rice with brown rice, bulgur wheat, or millet.  Start the day with a breakfast that is high in fiber, such as a cereal that contains at least 5 grams of fiber per serving.  Use beans in place of meat in soups, salads, or  pasta.  Eat high-fiber snacks, such as berries, raw vegetables, nuts, or popcorn.

## 2017-08-16 NOTE — Progress Notes (Signed)
Chief Complaint  Patient presents with  . Abdominal Pain    upper left quadrant pain x 1-2 weeks. Sharp pains after eating and episodes of SOB when this happens. No pain today    Patient is here for a new complaint.  She is had left upper quadrant abdominal pain off and on for 2 weeks.  At times it severe and feels quite crampy.  No nausea or vomiting.  No diarrhea.  No fever or chills.  No other illness.  She states that the spells seem to happen after she is had something to eat.  Immediately or a few minutes after.  They are self-limiting.  She is on omeprazole 20 a day for a long time.  She questions whether this is continuing to work. She also complains of chronic constipation.  She states this been going on for a long time.  She will sometimes go over a week without having a bowel movement.  At this time she is pretty uncomfortable.  She thinks she drinks enough water.  Probably does not get enough fiber in her diet.  Is not on any stool softeners or long-acting bowel medications.  She states she does not get a good response to Colace.  No blood in her bowels.  No family history of bowel or colon disorder.  Patient Active Problem List   Diagnosis Date Noted  . GERD without esophagitis 07/23/2016  . Essential hypertension 10/03/2015  . Morbid obesity (HCC) 10/03/2015    Outpatient Encounter Medications as of 08/16/2017  Medication Sig  . lisinopril (PRINIVIL,ZESTRIL) 5 MG tablet Take 1 tablet (5 mg total) by mouth daily.  . [DISCONTINUED] omeprazole (PRILOSEC) 20 MG capsule Take 1 capsule (20 mg total) by mouth daily.  . pantoprazole (PROTONIX) 20 MG tablet Take 1 tablet (20 mg total) by mouth daily.  . polyethylene glycol powder (GLYCOLAX/MIRALAX) powder Take 17 g by mouth daily.   No facility-administered encounter medications on file as of 08/16/2017.     No Known Allergies  Review of Systems  Constitutional: Positive for unexpected weight change. Negative for activity change,  appetite change and fatigue.       Has had consistent weight loss her last few visits through effort  HENT: Negative for congestion, postnasal drip and rhinorrhea.   Eyes: Negative for photophobia and visual disturbance.  Respiratory: Negative for cough and shortness of breath.   Cardiovascular: Negative for chest pain and palpitations.  Gastrointestinal: Positive for abdominal pain. Negative for blood in stool, constipation, diarrhea, nausea and vomiting.  Genitourinary: Negative for difficulty urinating and frequency.  Musculoskeletal: Negative for arthralgias and back pain.  Neurological: Negative for dizziness and headaches.  Psychiatric/Behavioral: Negative for dysphoric mood. The patient is not nervous/anxious.     Physical Exam  Constitutional: She is oriented to person, place, and time. She appears well-developed and well-nourished. She does not appear ill. No distress.  HENT:  Head: Normocephalic and atraumatic.  Right Ear: External ear normal.  Left Ear: External ear normal.  Mouth/Throat: Oropharynx is clear and moist.  Eyes: Pupils are equal, round, and reactive to light. Conjunctivae are normal.  Neck: Normal range of motion. Neck supple. No thyromegaly present.  Cardiovascular: Normal rate, regular rhythm and normal heart sounds.  Pulmonary/Chest: Effort normal and breath sounds normal. No respiratory distress.  Abdominal: Soft. Bowel sounds are normal. There is no hepatosplenomegaly. There is tenderness in the epigastric area. There is no rebound and no guarding.  Mild tenderness to deep palpation  in the epigastrium.  No organomegaly, no guarding or rebound  Musculoskeletal: Normal range of motion. She exhibits no edema.  Lymphadenopathy:    She has no cervical adenopathy.  Neurological: She is alert and oriented to person, place, and time.  Gait normal  Skin: Skin is warm and dry.  Psychiatric: She has a normal mood and affect. Her behavior is normal. Thought content  normal.  Nursing note and vitals reviewed.   BP 114/74   Pulse 80   Resp 16   Ht 5\' 9"  (1.753 m)   Wt 269 lb (122 kg)   LMP 07/20/2017   SpO2 98%   BMI 39.72 kg/m     ASSESSMENT/PLAN:  1. GERD without esophagitis Mild epigastric tenderness.  Would recommend switching from omeprazole to pantoprazole to see if she gets better results.  We discussed avoiding foods that aggravate the pain.Do not overfill the stomach or eat late at night.  2. Chronic constipation Discussed fluids, water.  Walking and exercise.  High-fiber diet.  Supplementation with MiraLAX.  Avoiding laxatives.  3. Intermittent left upper quadrant abdominal pain Possible gas trapping versus GERD   Patient Instructions  Drink plenty of wter Take the miralax daily Titrate up or down depending on result Stop the omeprazole Start pantoprazole Increase fiber in diet  Let me know if not improving in 2 weeks High-Fiber Diet Fiber, also called dietary fiber, is a type of carbohydrate found in fruits, vegetables, whole grains, and beans. A high-fiber diet can have many health benefits. Your health care provider may recommend a high-fiber diet to help:  Prevent constipation. Fiber can make your bowel movements more regular.  Lower your cholesterol.  Relieve hemorrhoids, uncomplicated diverticulosis, or irritable bowel syndrome.  Prevent overeating as part of a weight-loss plan.  Prevent heart disease, type 2 diabetes, and certain cancers.  What is my plan? The recommended daily intake of fiber includes:  38 grams for men under age 24.  30 grams for men over age 24.  25 grams for women under age 24.  21 grams for women over age 24.  You can get the recommended daily intake of dietary fiber by eating a variety of fruits, vegetables, grains, and beans. Your health care provider may also recommend a fiber supplement if it is not possible to get enough fiber through your diet. What do I need to know about a  high-fiber diet?  Fiber supplements have not been widely studied for their effectiveness, so it is better to get fiber through food sources.  Always check the fiber content on thenutrition facts label of any prepackaged food. Look for foods that contain at least 5 grams of fiber per serving.  Ask your dietitian if you have questions about specific foods that are related to your condition, especially if those foods are not listed in the following section.  Increase your daily fiber consumption gradually. Increasing your intake of dietary fiber too quickly may cause bloating, cramping, or gas.  Drink plenty of water. Water helps you to digest fiber. What foods can I eat? Grains Whole-grain breads. Multigrain cereal. Oats and oatmeal. Brown rice. Barley. Bulgur wheat. Millet. Bran muffins. Popcorn. Rye wafer crackers. Vegetables Sweet potatoes. Spinach. Kale. Artichokes. Cabbage. Broccoli. Green peas. Carrots. Squash. Fruits Berries. Pears. Apples. Oranges. Avocados. Prunes and raisins. Dried figs. Meats and Other Protein Sources Navy, kidney, pinto, and soy beans. Split peas. Lentils. Nuts and seeds. Dairy Fiber-fortified yogurt. Beverages Fiber-fortified soy milk. Fiber-fortified orange juice. Other Fiber bars. The  items listed above may not be a complete list of recommended foods or beverages. Contact your dietitian for more options. What foods are not recommended? Grains White bread. Pasta made with refined flour. White rice. Vegetables Fried potatoes. Canned vegetables. Well-cooked vegetables. Fruits Fruit juice. Cooked, strained fruit. Meats and Other Protein Sources Fatty cuts of meat. Fried Environmental education officer or fried fish. Dairy Milk. Yogurt. Cream cheese. Sour cream. Beverages Soft drinks. Other Cakes and pastries. Butter and oils. The items listed above may not be a complete list of foods and beverages to avoid. Contact your dietitian for more information. What are some tips  for including high-fiber foods in my diet?  Eat a wide variety of high-fiber foods.  Make sure that half of all grains consumed each day are whole grains.  Replace breads and cereals made from refined flour or white flour with whole-grain breads and cereals.  Replace white rice with brown rice, bulgur wheat, or millet.  Start the day with a breakfast that is high in fiber, such as a cereal that contains at least 5 grams of fiber per serving.  Use beans in place of meat in soups, salads, or pasta.  Eat high-fiber snacks, such as berries, raw vegetables, nuts, or popcorn.     Eustace Moore, MD

## 2017-09-06 ENCOUNTER — Emergency Department (HOSPITAL_COMMUNITY)
Admission: EM | Admit: 2017-09-06 | Discharge: 2017-09-06 | Disposition: A | Discharge status: Home / Self Care | Payer: BLUE CROSS/BLUE SHIELD | Attending: Emergency Medicine | Admitting: Emergency Medicine

## 2017-09-06 ENCOUNTER — Encounter (HOSPITAL_COMMUNITY): Payer: Self-pay | Admitting: Emergency Medicine

## 2017-09-06 ENCOUNTER — Encounter: Payer: Self-pay | Admitting: Family Medicine

## 2017-09-06 DIAGNOSIS — N611 Abscess of the breast and nipple: Secondary | ICD-10-CM

## 2017-09-06 DIAGNOSIS — I10 Essential (primary) hypertension: Secondary | ICD-10-CM | POA: Insufficient documentation

## 2017-09-06 DIAGNOSIS — R222 Localized swelling, mass and lump, trunk: Secondary | ICD-10-CM | POA: Diagnosis present

## 2017-09-06 DIAGNOSIS — Z79899 Other long term (current) drug therapy: Secondary | ICD-10-CM | POA: Diagnosis not present

## 2017-09-06 MED ORDER — DOXYCYCLINE HYCLATE 100 MG PO CAPS: 100.0000 mg | ORAL_CAPSULE | Freq: Two times a day (BID) | ORAL | 0 refills | Status: DC

## 2017-09-06 MED ORDER — DOXYCYCLINE HYCLATE 100 MG PO TABS
100.0000 mg | ORAL_TABLET | Freq: Once | ORAL | Status: AC
  Administered 2017-09-06: 100 mg via ORAL
  Filled 2017-09-06: qty 1

## 2017-09-06 NOTE — Discharge Instructions (Addendum)
Please soak in a tub of warm [place] salt water daily for about 10 to 15 minutes until the wound is healed.  Please apply a Band-Aid or dressing daily.  Please use doxycycline 2 times daily with food.  Please see Dr. Hermine Messick or return to the emergency department if any changes in your condition, problems, or concerns.

## 2017-09-06 NOTE — Telephone Encounter (Signed)
Patient informed of message below, verbalized understanding.  

## 2017-09-06 NOTE — Telephone Encounter (Signed)
Yes, patient does need to be seen, however, we do not have any available appointments for today. Please advise that she should go to the [facility]. There area does appear infected and she will need to be evaluated and get antibiotics most likely.  Janine Limbo. Tracie Harrier, MD

## 2017-09-06 NOTE — ED Triage Notes (Signed)
Pt reports abscess under left breast x 2 days.

## 2017-09-06 NOTE — ED Provider Notes (Signed)
[facility] EMERGENCY DEPARTMENT Provider Note   CSN: [id number] Arrival date & time: 09/06/17  1035     History   Chief Complaint Chief Complaint  Patient presents with  . Abscess    HPI Amy Nichols is a 22 y.o. female.  Patient is a 22 year old female who presents to the emergency department with an abscess of the left breast.  The patient states that this problem was noticed approximately 2 days ago.  She noticed increased soreness present.  This actually occurred after she was mowing the lawn.  She does not recall anything biting her.  She denies using underwire bras.  She has not had any injury to the area.  patient denies human bite to the breast area.  No fever or chills reported recently.  Patient has not seen any red streaking going up the breast.  There is been no drainage reported from the nipple area.  The history is provided by the patient.  Abscess  Abscess location: left breast.   Past Medical History:  Diagnosis Date  . GERD (gastroesophageal reflux disease)   . Hypertension     Patient Active Problem List   Diagnosis Date Noted  . GERD without esophagitis 07/23/2016  . Essential hypertension 10/03/2015  . Morbid obesity (HCC) 10/03/2015    History reviewed. No pertinent surgical history.   OB History   None      Home Medications    Prior to Admission medications   Medication Sig Start Date End Date Taking? Authorizing Provider  lisinopril (PRINIVIL,ZESTRIL) 5 MG tablet Take 1 tablet (5 mg total) by mouth daily. 10/29/16   Eustace Moore, MD  pantoprazole (PROTONIX) 20 MG tablet Take 1 tablet (20 mg total) by mouth daily. 08/16/17   Eustace Moore, MD  polyethylene glycol powder (GLYCOLAX/MIRALAX) powder Take 17 g by mouth daily. 08/16/17   Eustace Moore, MD    Family History Family History  Problem Relation Age of Onset  . Asthma Mother   . Hypertension Mother   . Asthma Father   . Hypertension Father   . COPD Maternal  Grandmother   . COPD Maternal Grandfather   . Hypertension Paternal Grandmother   . Hypertension Paternal Grandfather     Social History Social History   Tobacco Use  . Smoking status: Never Smoker  . Smokeless tobacco: Never Used  Substance Use Topics  . Alcohol use: No  . Drug use: No     Allergies   Patient has no known allergies.   Review of Systems Review of Systems  Constitutional: Negative for activity change.       All ROS Neg except as noted in HPI  HENT: Negative for nosebleeds.   Eyes: Negative for photophobia and discharge.  Respiratory: Negative for cough, shortness of breath and wheezing.   Cardiovascular: Negative for chest pain and palpitations.  Gastrointestinal: Negative for abdominal pain and blood in stool.  Genitourinary: Negative for dysuria, frequency and hematuria.  Musculoskeletal: Negative for arthralgias, back pain and neck pain.  Skin: Positive for wound.       Wound to the left breast  Neurological: Negative for dizziness, seizures and speech difficulty.  Psychiatric/Behavioral: Negative for confusion and hallucinations.     Physical Exam Updated Vital Signs BP 124/83 (BP Location: Right Arm)   Pulse 74   Temp 98.4 F (36.9 C) (Oral)   Resp 16   Ht  (1.753 m)   Wt 122 kg (269 lb)   LMP 08/09/2017  SpO2 100%   BMI 39.72 kg/m   Physical Exam  Constitutional: She is oriented to person, place, and time. She appears well-developed and well-nourished.  Non-toxic appearance.  HENT:  Head: Normocephalic.  Right Ear: Tympanic membrane and external ear normal.  Left Ear: Tympanic membrane and external ear normal.  Eyes: Pupils are equal, round, and reactive to light. EOM and lids are normal.  Neck: Normal range of motion. Neck supple. Carotid bruit is not present.  Cardiovascular: Normal rate, regular rhythm, normal heart sounds, intact distal pulses and normal pulses.  Pulmonary/Chest: Breath sounds normal. No respiratory  distress.  Chaperone present during the examination.  Patient has a small open area at the 4 to 5 o'clock position of the left breast.  There is mild to moderate increased redness around the area.  The area is tender to touch.  No active drainage at this moment.  No red streaks appreciated.  There is no drainage from the nipple area.  There are no palpable nodes in the axilla on the left.  Abdominal: Soft. Bowel sounds are normal. There is no tenderness. There is no guarding.  Musculoskeletal: Normal range of motion.  Lymphadenopathy:       Head (right side): No submandibular adenopathy present.       Head (left side): No submandibular adenopathy present.    She has no cervical adenopathy.  Neurological: She is alert and oriented to person, place, and time. She has normal strength. No cranial nerve deficit or sensory deficit.  Skin: Skin is warm and dry.  Psychiatric: She has a normal mood and affect. Her speech is normal.  Nursing note and vitals reviewed.    ED Treatments / Results  Labs (all labs ordered are listed, but only abnormal results are displayed) Labs Reviewed - No data to display  EKG None  Radiology No results found.  Procedures Procedures (including critical care time)  Medications Ordered in ED Medications - No data to display   Initial Impression / Assessment and Plan / ED Course  I have reviewed the triage vital signs and the nursing notes.  Pertinent labs & imaging results that were available during my care of the patient were reviewed by me and considered in my medical decision making (see chart for details).       Final Clinical Impressions(s) / ED Diagnoses MDM  Vital signs are within normal limits.  Pulse oximetry is 100% on room air.  Within normal limits by my interpretation.  Patient developed a small ulcer of the left breast.  There is no red streaking appreciated.  No evidence of systemic infection.  The patient is not diabetic.  And the  patient denies a human bite to the breast area.  The patient will be treated with warm tub soaks and antibiotic therapy.  I have asked the patient to change the dressing daily.  The patient will see her primary physician or return to the emergency department if any signs of advancing infection.  Patient is in agreement with this plan.   Final diagnoses:  Abscess of left breast    ED Discharge Orders        Ordered    doxycycline (VIBRAMYCIN) 100 MG capsule  2 times daily     09/06/17 1235       Ivery Quale, PA-C 09/06/17 1236    Blane Ohara, MD 09/06/17 1524

## 2017-10-15 ENCOUNTER — Encounter: Payer: Self-pay | Admitting: Family Medicine

## 2017-12-11 ENCOUNTER — Encounter: Payer: Self-pay | Admitting: Family Medicine

## 2018-03-22 ENCOUNTER — Ambulatory Visit: Payer: BLUE CROSS/BLUE SHIELD | Admitting: Family Medicine

## 2018-03-30 ENCOUNTER — Encounter: Payer: Self-pay | Admitting: Family Medicine

## 2018-04-08 ENCOUNTER — Ambulatory Visit (INDEPENDENT_AMBULATORY_CARE_PROVIDER_SITE_OTHER): Payer: 59 | Admitting: Family Medicine

## 2018-04-08 ENCOUNTER — Encounter: Payer: Self-pay | Admitting: Family Medicine

## 2018-04-08 VITALS — BP 128/83 | HR 85 | Temp 97.8°F | Ht 69.0 in | Wt 274.2 lb

## 2018-04-08 DIAGNOSIS — K219 Gastro-esophageal reflux disease without esophagitis: Secondary | ICD-10-CM | POA: Diagnosis not present

## 2018-04-08 DIAGNOSIS — M79672 Pain in left foot: Secondary | ICD-10-CM

## 2018-04-08 DIAGNOSIS — Z23 Encounter for immunization: Secondary | ICD-10-CM

## 2018-04-08 DIAGNOSIS — I10 Essential (primary) hypertension: Secondary | ICD-10-CM

## 2018-04-08 DIAGNOSIS — E781 Pure hyperglyceridemia: Secondary | ICD-10-CM

## 2018-04-08 MED ORDER — OMEPRAZOLE 40 MG PO CPDR: 40.0000 mg | DELAYED_RELEASE_CAPSULE | Freq: Every day | ORAL | 3 refills | Status: DC

## 2018-04-08 NOTE — Addendum Note (Signed)
Addended by: Arville CareETTINGER, JOSHUA on: 04/08/2018 09:00 AM   Modules accepted: Orders

## 2018-04-08 NOTE — Progress Notes (Signed)
BP 128/83   Pulse 85   Temp 97.8 F (36.6 C)   Ht 5\' 9"  (1.753 m)   Wt 274 lb 3.2 oz (124.4 kg)   BMI 40.49 kg/m    Subjective:    Patient ID: Amy Nichols, female    DOB: [birth year], 22 y.o.   MRN: [id number]  HPI: Amy Nichols is a 22 y.o. female presenting on 04/08/2018 for Medical Management of Chronic Issues   HPI Hypertension Patient is currently on no medication currently, has stopped her lisinopril, and their blood pressure today is 128/83. Patient denies any lightheadedness or dizziness. Patient denies headaches, blurred vision, chest pains, shortness of breath, or weakness. Denies any side effects from medication and is content with current medication.   GERD Patient is currently on omeprazole.  She denies any major symptoms or abdominal pain or belching or burping. She denies any blood in her stool or lightheadedness or dizziness.   Hyperlipidemia Patient is coming in for recheck of his hyperlipidemia. The patient is currently taking no medication currently and we are monitoring and diet control, mainly triglycerides elevated. They deny any issues with myalgias or history of liver damage from it. They deny any focal numbness or weakness or chest pain.   Left foot pain Patient describes mild left foot pain on the medial aspect of her foot near her great toe that has been hurting her almost every day and hurts more with prolonged standing.  She says is been hurting her over the past couple months since September.  She denies any specific trauma, she has been using her inserts and does not feel like they have been helping this although her plantar fasciitis has improved.  Relevant past medical, surgical, family and social history reviewed and updated as indicated. Interim medical history since our last visit reviewed. Allergies and medications reviewed and updated.  Review of Systems  Constitutional: Negative for chills and fever.  Eyes: Negative for visual  disturbance.  Respiratory: Negative for chest tightness and shortness of breath.   Cardiovascular: Negative for chest pain and leg swelling.  Gastrointestinal: Negative for abdominal pain, constipation, diarrhea, nausea and vomiting.  Musculoskeletal: Positive for arthralgias. Negative for back pain, gait problem and joint swelling.  Skin: Negative for color change and rash.  Neurological: Negative for light-headedness and headaches.  Psychiatric/Behavioral: Negative for agitation and behavioral problems.  All other systems reviewed and are negative.   Per HPI unless specifically indicated above   Allergies as of 04/08/2018   No Known Allergies     Medication List        Accurate as of 04/08/18  8:35 AM. Always use your most recent med list.          pantoprazole 20 MG tablet Commonly known as:  PROTONIX Take 1 tablet (20 mg total) by mouth daily.          Objective:    BP 128/83   Pulse 85   Temp 97.8 F (36.6 C)   Ht 5\' 9"  (1.753 m)   Wt 274 lb 3.2 oz (124.4 kg)   BMI 40.49 kg/m   Wt Readings from Last 3 Encounters:  04/08/18 274 lb 3.2 oz (124.4 kg)  09/06/17 269 lb (122 kg)  08/16/17 269 lb (122 kg)    Physical Exam  Constitutional: She is oriented to person, place, and time. She appears well-developed and well-nourished. No distress.  Eyes: Conjunctivae are normal.  Cardiovascular: Normal rate, regular rhythm, normal heart sounds and intact distal  pulses.  No murmur heard. Pulmonary/Chest: Effort normal and breath sounds normal. No respiratory distress. She has no wheezes.  Abdominal: Soft. Bowel sounds are normal. She exhibits no distension. There is no tenderness.  Musculoskeletal: Normal range of motion. She exhibits no edema.       Left foot: There is normal range of motion, no tenderness (No tenderness on exam or with range of motion), no bony tenderness, no swelling, normal capillary refill and no deformity.       Feet:  Neurological: She is  alert and oriented to person, place, and time. Coordination normal.  Skin: Skin is warm and dry. No rash noted. She is not diaphoretic.  Psychiatric: She has a normal mood and affect. Her behavior is normal.  Nursing note and vitals reviewed.       Assessment & Plan:   Problem List Items Addressed This Visit      Cardiovascular and Mediastinum   Essential hypertension - Primary     Digestive   GERD without esophagitis   Relevant Medications   omeprazole (PRILOSEC) 40 MG capsule     Other   Morbid obesity (HCC)   Hypertriglyceridemia    Other Visit Diagnoses    Left foot pain       On the medial aspect between metatarsal phalangeal and tarsometatarsal joints, has tried inserts and anti-inflammatories, will send to podiatry   Relevant Orders   Ambulatory referral to Podiatry   Need for immunization against influenza       Relevant Orders   Flu Vaccine QUAD 36+ mos IM (Completed)      Discussed weight loss options and increasing activity and reducing portion sizing and not drinking her calories Follow up plan: Return in about 6 months (around 10/07/2018), or if symptoms worsen or fail to improve, for Recheck hypertension and GERD, patient also needs a Pap but may schedule it then or sooner.  Counseling provided for all of the vaccine components No orders of the defined types were placed in this encounter.   Arville Care, MD [facility] 04/08/2018, 8:35 AM

## 2018-04-09 LAB — CBC WITH DIFFERENTIAL/PLATELET
Basophils Absolute: 0 10*3/uL (ref 0.0–0.2)
Basos: 0 %
EOS (ABSOLUTE): 0.1 10*3/uL (ref 0.0–0.4)
EOS: 1 %
HEMATOCRIT: 37.6 % (ref 34.0–46.6)
Hemoglobin: 11.9 g/dL (ref 11.1–15.9)
Immature Grans (Abs): 0 10*3/uL (ref 0.0–0.1)
Immature Granulocytes: 0 %
LYMPHS ABS: 2.6 10*3/uL (ref 0.7–3.1)
Lymphs: 44 %
MCH: 27.4 pg (ref 26.6–33.0)
MCHC: 31.6 g/dL (ref 31.5–35.7)
MCV: 87 fL (ref 79–97)
MONOS ABS: 0.5 10*3/uL (ref 0.1–0.9)
Monocytes: 8 %
NEUTROS PCT: 47 %
Neutrophils Absolute: 2.8 10*3/uL (ref 1.4–7.0)
Platelets: 109 10*3/uL — ABNORMAL LOW (ref 150–450)
RBC: 4.34 x10E6/uL (ref 3.77–5.28)
RDW: 13.3 % (ref 12.3–15.4)
WBC: 6 10*3/uL (ref 3.4–10.8)

## 2018-04-09 LAB — CMP14+EGFR
A/G RATIO: 1.3 (ref 1.2–2.2)
ALT: 15 IU/L (ref 0–32)
AST: 16 IU/L (ref 0–40)
Albumin: 4.1 g/dL (ref 3.5–5.5)
Alkaline Phosphatase: 59 IU/L (ref 39–117)
BILIRUBIN TOTAL: 0.4 mg/dL (ref 0.0–1.2)
BUN/Creatinine Ratio: 15 (ref 9–23)
BUN: 11 mg/dL (ref 6–20)
CHLORIDE: 102 mmol/L (ref 96–106)
CO2: 24 mmol/L (ref 20–29)
Calcium: 9.2 mg/dL (ref 8.7–10.2)
Creatinine, Ser: 0.74 mg/dL (ref 0.57–1.00)
GFR calc non Af Amer: 114 mL/min/{1.73_m2} (ref >59)
GFR, EST AFRICAN AMERICAN: 131 mL/min/{1.73_m2} (ref >59)
GLOBULIN, TOTAL: 3.2 g/dL (ref 1.5–4.5)
Glucose: 93 mg/dL (ref 65–99)
POTASSIUM: 3.8 mmol/L (ref 3.5–5.2)
Sodium: 138 mmol/L (ref 134–144)
TOTAL PROTEIN: 7.3 g/dL (ref 6.0–8.5)

## 2018-04-09 LAB — LIPID PANEL
CHOLESTEROL TOTAL: 155 mg/dL (ref 100–199)
Chol/HDL Ratio: 2.9 ratio (ref 0.0–4.4)
HDL: 53 mg/dL (ref >39)
LDL Calculated: 84 mg/dL (ref 0–99)
TRIGLYCERIDES: 91 mg/dL (ref 0–149)
VLDL Cholesterol Cal: 18 mg/dL (ref 5–40)

## 2018-05-06 ENCOUNTER — Ambulatory Visit (INDEPENDENT_AMBULATORY_CARE_PROVIDER_SITE_OTHER): Payer: 59 | Admitting: Pediatrics

## 2018-05-06 ENCOUNTER — Telehealth: Payer: Self-pay | Admitting: Family Medicine

## 2018-05-06 ENCOUNTER — Encounter: Payer: Self-pay | Admitting: Pediatrics

## 2018-05-06 VITALS — BP 127/82 | HR 89 | Temp 98.1°F | Ht 69.0 in | Wt 269.0 lb

## 2018-05-06 DIAGNOSIS — J069 Acute upper respiratory infection, unspecified: Secondary | ICD-10-CM | POA: Diagnosis not present

## 2018-05-06 NOTE — Progress Notes (Signed)
  Subjective:   Patient ID: Amy Nichols, female    DOB: Dec 17, 1995, [age] y.o.   MRN: [id number] CC: Ear Pain; Chest congestion; Cough; and Nasal Congestion  HPI: Amy Nichols is a 22 y.o. female   Today is the third day of being sick.  Nasal congestion, cough bothering her the most.  There has been feeling scratchy.  Denies sore throat.  Ears been feeling full.  No fevers.  Appetite is been slightly down.  Drinking lots of fluids regularly.  Relevant past medical, surgical, family and social history reviewed. Allergies and medications reviewed and updated. Social History   Tobacco Use  Smoking Status Never Smoker  Smokeless Tobacco Never Used   ROS: Per HPI   Objective:    BP 127/82   Pulse 89   Temp 98.1 F (36.7 C) (Oral)   Ht 5\' 9"  (1.753 m)   Wt 269 lb (122 kg)   BMI 39.72 kg/m   Wt Readings from Last 3 Encounters:  05/06/18 269 lb (122 kg)  04/08/18 274 lb 3.2 oz (124.4 kg)  09/06/17 269 lb (122 kg)    Gen: NAD, alert, cooperative with exam, NCAT, congested EYES: EOMI, no conjunctival injection, or no icterus ENT:  TMs dull gray with clear effusion right TM b/l, OP with mild erythema LYMPH: <1cm b/l anterior cervical LAD CV: NRRR, normal S1/S2, no murmur, distal pulses 2+ b/l Resp: CTABL, no wheezes, normal WOB Ext: No edema, warm Neuro: Alert and oriented, strength equal b/l UE and LE, coordination grossly normal MSK: normal muscle bulk  Assessment & Plan:  Amy Nichols was seen today for ear pain, chest congestion, cough and nasal congestion.  Diagnoses and all orders for this visit:  Acute URI Symptom care and return precautions discussed.  Follow up plan: Return if symptoms worsen or fail to improve. Rex Krasarol Vincent, MD [facility]

## 2018-05-06 NOTE — Patient Instructions (Signed)
Fever reducer and headache: tylenol   Sinus pressure:  Nasal steroid such as flonase/fluticaone or nasocort daily Can also take daily antihistamine such as loratadine/claritin or cetirizine/zyrtec  Sinus rinses/irritation: Netipot or similar with distilled water 2-3 times a day to clear out sinuses or Normal saline nasal spray  Sore throat:  Throat lozenges chloroseptic spray  Stick with bland foods Drink lots of fluids  

## 2018-05-08 ENCOUNTER — Emergency Department (HOSPITAL_COMMUNITY)
Admission: EM | Admit: 2018-05-08 | Discharge: 2018-05-08 | Disposition: A | Discharge status: Home / Self Care | Payer: 59 | Attending: Emergency Medicine | Admitting: Emergency Medicine

## 2018-05-08 ENCOUNTER — Other Ambulatory Visit: Payer: Self-pay

## 2018-05-08 ENCOUNTER — Encounter (HOSPITAL_COMMUNITY): Payer: Self-pay

## 2018-05-08 DIAGNOSIS — J069 Acute upper respiratory infection, unspecified: Secondary | ICD-10-CM | POA: Diagnosis not present

## 2018-05-08 DIAGNOSIS — B9789 Other viral agents as the cause of diseases classified elsewhere: Secondary | ICD-10-CM

## 2018-05-08 DIAGNOSIS — I10 Essential (primary) hypertension: Secondary | ICD-10-CM | POA: Diagnosis not present

## 2018-05-08 DIAGNOSIS — R05 Cough: Secondary | ICD-10-CM | POA: Diagnosis present

## 2018-05-08 MED ORDER — BENZONATATE 100 MG PO CAPS
100.0000 mg | ORAL_CAPSULE | Freq: Three times a day (TID) | ORAL | 0 refills | Status: DC
Start: 2018-05-08 — End: 2018-05-13

## 2018-05-08 NOTE — ED Provider Notes (Signed)
[facility] EMERGENCY DEPARTMENT Provider Note   CSN: [id number] Arrival date & time: 05/08/18  0315     History   Chief Complaint Chief Complaint  Patient presents with  . Cough    HPI Amy Nichols is a 22 y.o. female.  The history is provided by the patient.  Cough  This is a new problem. The current episode started more than 2 days ago. The problem has been gradually worsening. The cough is productive of sputum. There has been no fever. Associated symptoms include sore throat. Pertinent negatives include no chest pain, no chills, no ear pain and no shortness of breath. She has tried decongestants for the symptoms. The treatment provided no relief. She is not a smoker.   Patient reports onset of symptoms of an upper respiratory infection about 4 days ago.  She reports cough congestion.  She has been taken OTC Sudafed with minimal relief.  She reports her sputum is becoming discolored, but denies hemoptysis.  No vomiting or diarrhea.  No chest pain or shortness of breath.  Reports sore throat and swelling. Past Medical History:  Diagnosis Date  . GERD (gastroesophageal reflux disease)   . Hypertension     Patient Active Problem List   Diagnosis Date Noted  . Hypertriglyceridemia 04/08/2018  . GERD without esophagitis 07/23/2016  . Essential hypertension 10/03/2015  . Morbid obesity (HCC) 10/03/2015    History reviewed. No pertinent surgical history.   OB History   No obstetric history on file.      Home Medications    Prior to Admission medications   Medication Sig Start Date End Date Taking? Authorizing Provider  omeprazole (PRILOSEC) 40 MG capsule Take 1 capsule (40 mg total) by mouth daily. 04/08/18  Yes Dettinger, Elige RadonJoshua A, MD  benzonatate (TESSALON) 100 MG capsule Take 1 capsule (100 mg total) by mouth every 8 (eight) hours. 05/08/18   Zadie RhineWickline, Alawna Graybeal, MD    Family History Family History  Problem Relation Age of Onset  . Asthma Mother   .  Hypertension Mother   . Asthma Father   . Hypertension Father   . COPD Maternal Grandmother   . COPD Maternal Grandfather   . Hypertension Paternal Grandmother   . Hypertension Paternal Grandfather     Social History Social History   Tobacco Use  . Smoking status: Never Smoker  . Smokeless tobacco: Never Used  Substance Use Topics  . Alcohol use: No  . Drug use: No     Allergies   Patient has no known allergies.   Review of Systems Review of Systems  Constitutional: Negative for chills.  HENT: Positive for sore throat. Negative for ear pain.   Respiratory: Positive for cough. Negative for shortness of breath.   Cardiovascular: Negative for chest pain.  Gastrointestinal: Negative for diarrhea and vomiting.  All other systems reviewed and are negative.    Physical Exam Updated Vital Signs BP (!) 142/80 (BP Location: Right Arm)   Pulse 88   Temp 97.8 F (36.6 C) (Oral)   Resp 16   Ht 1.753 m (5\' 9" )   Wt 119.7 kg   LMP 05/01/2018 (Exact Date)   SpO2 99%   BMI 38.99 kg/m   Physical Exam CONSTITUTIONAL: Well developed/well nourished HEAD: Normocephalic/atraumatic EYES: EOMI/PERRL ENMT: Mucous membranes moist, uvula midline without erythema or exudate NECK: supple no meningeal signs SPINE/BACK:entire spine nontender CV: S1/S2 noted, no murmurs/rubs/gallops noted LUNGS: Lungs are clear to auscultation bilaterally, no apparent distress ABDOMEN: soft, nontender, no rebound  or guarding, bowel sounds noted throughout abdomen GU:no cva tenderness NEURO: Pt is awake/alert/appropriate, moves all extremitiesx4.  No facial droop.   EXTREMITIES: pulses normal/equal, full ROM SKIN: warm, color normal PSYCH: no abnormalities of mood noted, alert and oriented to situation   ED Treatments / Results  Labs (all labs ordered are listed, but only abnormal results are displayed) Labs Reviewed - No data to display  EKG None  Radiology No results  found.  Procedures Procedures (including critical care time)  Medications Ordered in ED Medications - No data to display   Initial Impression / Assessment and Plan / ED Course  I have reviewed the triage vital signs and the nursing notes.      Suspicion for viral URI.  Patient is afebrile, less likely influenza.  Lung sounds clear, doubtful for pneumonia.  We discussed use of OTC meds.  We agreed to add on Tessalon for daytime cough relief. Discussed expected course of viral URI.  Will discharge home  Final Clinical Impressions(s) / ED Diagnoses   Final diagnoses:  Viral URI with cough    ED Discharge Orders         Ordered    benzonatate (TESSALON) 100 MG capsule  Every 8 hours     05/08/18 0431           Zadie RhineWickline, Chara Marquard, MD 05/08/18 0451

## 2018-05-08 NOTE — ED Triage Notes (Signed)
Pt reports cough and congestion since Wednesday. Pt seen by PCP on Friday and told she has a virus, but Pt presents today to ED stating she does not feel this is a virus. Pt presents today with left side throat swelling and tenderness. Pt denies N/V/D but reports decreased appetite.

## 2018-05-09 NOTE — Telephone Encounter (Signed)
Has she been around him?  Is she having any fevers?  Per chart review she was in the emergency room yesterday, also diagnosed with acute upper respiratory infection as we discussed at her office visit on Saturday.  Unless she is having fevers, I doubt her current symptoms are related to the flu but rather a separate virus.  If she wants to be on prophylaxis for the flu because she has been exposed to her nephew recently, okay to send in Tamiflu 75 mg take once daily for 10 days.  If she develops fevers she is to take whatever she has remaining twice daily.

## 2018-05-09 NOTE — Telephone Encounter (Signed)
Left message for patient to call.

## 2018-05-09 NOTE — Telephone Encounter (Signed)
Please advise if medicine will be sent in. 

## 2018-05-13 ENCOUNTER — Encounter: Payer: Self-pay | Admitting: Family Medicine

## 2018-05-13 ENCOUNTER — Ambulatory Visit (INDEPENDENT_AMBULATORY_CARE_PROVIDER_SITE_OTHER): Payer: 59 | Admitting: Family Medicine

## 2018-05-13 VITALS — BP 131/86 | HR 86 | Temp 97.4°F | Ht 69.0 in | Wt 270.6 lb

## 2018-05-13 DIAGNOSIS — Z0001 Encounter for general adult medical examination with abnormal findings: Secondary | ICD-10-CM | POA: Diagnosis not present

## 2018-05-13 DIAGNOSIS — Z3009 Encounter for other general counseling and advice on contraception: Secondary | ICD-10-CM | POA: Diagnosis not present

## 2018-05-13 DIAGNOSIS — Z0000 Encounter for general adult medical examination without abnormal findings: Secondary | ICD-10-CM

## 2018-05-13 NOTE — Telephone Encounter (Signed)
Attempts to contact pt without return call in over 3 days, will close encounter. 

## 2018-05-13 NOTE — Progress Notes (Signed)
BP 131/86   Pulse 86   Temp (!) 97.4 F (36.3 C) (Oral)   Ht 5\' 9"  (1.753 m)   Wt 270 lb 9.6 oz (122.7 kg)   LMP 05/01/2018 (Exact Date)   BMI 39.96 kg/m    Subjective:    Patient ID: Amy Nichols, female    DOB: 1995-06-29, 23 y.o.   MRN: [id number]  HPI: Amy GlassKadedra Fehring is a 23 y.o. female presenting on 05/13/2018 for Abnormal Pap Smear   HPI Well woman exam and gynecological exam and repeat Pap smear for abnormal in the past Patient denies any sexual activity or ever being sexually active but does have some plans of possibly in the near future starting and so would like to do birth control also to help with menstrual cycle control.  She has thought about it and does not want to do [facility]. Patient denies any chest pain, shortness of breath, headaches or vision issues, abdominal complaints, diarrhea, nausea, vomiting, or joint issues.   Relevant past medical, surgical, family and social history reviewed and updated as indicated. Interim medical history since our last visit reviewed. Allergies and medications reviewed and updated.  Review of Systems  Constitutional: Negative for chills and fever.  HENT: Negative for ear pain and tinnitus.   Eyes: Negative for pain, redness and visual disturbance.  Respiratory: Negative for cough, chest tightness, shortness of breath and wheezing.   Cardiovascular: Negative for chest pain, palpitations and leg swelling.  Gastrointestinal: Negative for abdominal pain, blood in stool, constipation and diarrhea.  Genitourinary: Negative for dysuria, hematuria, vaginal bleeding, vaginal discharge and vaginal pain.  Musculoskeletal: Negative for back pain, gait problem and myalgias.  Skin: Negative for rash.  Neurological: Negative for dizziness, weakness, light-headedness and headaches.  Psychiatric/Behavioral: Negative for agitation, behavioral problems and suicidal ideas.  All other systems reviewed and are negative.   Per HPI unless  specifically indicated above   Allergies as of 05/13/2018   No Known Allergies     Medication List       Accurate as of May 13, 2018  3:23 PM. Always use your most recent med list.        omeprazole 40 MG capsule Commonly known as:  PRILOSEC Take 1 capsule (40 mg total) by mouth daily.          Objective:    BP 131/86   Pulse 86   Temp (!) 97.4 F (36.3 C) (Oral)   Ht 5\' 9"  (1.753 m)   Wt 270 lb 9.6 oz (122.7 kg)   LMP 05/01/2018 (Exact Date)   BMI 39.96 kg/m   Wt Readings from Last 3 Encounters:  05/13/18 270 lb 9.6 oz (122.7 kg)  05/08/18 264 lb (119.7 kg)  05/06/18 269 lb (122 kg)    Physical Exam Vitals signs reviewed.  Constitutional:      General: She is not in acute distress.    Appearance: She is well-developed. She is not diaphoretic.  Eyes:     Conjunctiva/sclera: Conjunctivae normal.  Neck:     Musculoskeletal: Neck supple.     Thyroid: No thyromegaly.  Cardiovascular:     Rate and Rhythm: Normal rate and regular rhythm.     Heart sounds: Normal heart sounds. No murmur.  Pulmonary:     Effort: Pulmonary effort is normal. No respiratory distress.     Breath sounds: Normal breath sounds. No wheezing.  Chest:     Breasts: Breasts are symmetrical.        Right:  No inverted nipple, mass, nipple discharge, skin change or tenderness.        Left: No inverted nipple, mass, nipple discharge, skin change or tenderness.  Abdominal:     General: Bowel sounds are normal. There is no distension.     Palpations: Abdomen is soft.     Tenderness: There is no abdominal tenderness. There is no guarding or rebound.  Genitourinary:    Exam position: Supine.     Labia:        Right: No rash or lesion.        Left: No rash or lesion.      Vagina: Normal.     Cervix: No cervical motion tenderness, discharge or friability.     Uterus: Not deviated, not enlarged, not fixed and not tender.      Adnexa:        Right: No mass or tenderness.         Left: No mass  or tenderness.    Musculoskeletal: Normal range of motion.  Lymphadenopathy:     Cervical: No cervical adenopathy.  Skin:    General: Skin is warm and dry.     Findings: No rash.  Neurological:     Mental Status: She is alert and oriented to person, place, and time.     Coordination: Coordination normal.  Psychiatric:        Behavior: Behavior normal.        Assessment & Plan:   Problem List Items Addressed This Visit      Other   Morbid obesity (HCC)    Other Visit Diagnoses    Well woman exam without gynecological exam    -  Primary   Relevant Orders   Pap IG, CT/NG w/ reflex HPV when ASC-U   Birth control counseling       Patient is not sexually active but does want to do a prevention and would like to try [facility], we will schedule back for that    Will schedule back for [facility] around the time of her next period which should be near the end of the month  Follow up plan: Return in about 3 weeks (around 06/03/2018), or if symptoms worsen or fail to improve, for IUD insertion [place].  Counseling provided for all of the vaccine components No orders of the defined types were placed in this encounter.   Arville CareJoshua Asmara Backs, MD [facility] 05/13/2018, 3:23 PM

## 2018-05-17 LAB — PAP IG, CT-NG, RFX HPV ASCU
CHLAMYDIA, NUC. ACID AMP: NEGATIVE
GONOCOCCUS BY NUCLEIC ACID AMP: NEGATIVE

## 2018-05-27 ENCOUNTER — Telehealth: Payer: Self-pay | Admitting: Family Medicine

## 2018-05-27 NOTE — Telephone Encounter (Signed)
LM for pt to be here MON 1/20 at 8:55 (15 min spot ) per Dr D.  She is aware by VM to call back if this time does not work for her.

## 2018-05-27 NOTE — Telephone Encounter (Signed)
Pt is scheduled for IUD on 1/30 and was told to call back and let Shanda Bumpsjessica know if she started her period, pt has called stating that she started her period today 1/17

## 2018-05-30 ENCOUNTER — Encounter: Payer: Self-pay | Admitting: Family Medicine

## 2018-05-30 ENCOUNTER — Ambulatory Visit (INDEPENDENT_AMBULATORY_CARE_PROVIDER_SITE_OTHER): Payer: 59 | Admitting: Family Medicine

## 2018-05-30 VITALS — BP 132/87 | HR 86 | Temp 96.9°F | Ht 69.0 in | Wt 272.0 lb

## 2018-05-30 DIAGNOSIS — Z3009 Encounter for other general counseling and advice on contraception: Secondary | ICD-10-CM

## 2018-05-30 DIAGNOSIS — Z30430 Encounter for insertion of intrauterine contraceptive device: Secondary | ICD-10-CM

## 2018-05-30 LAB — PREGNANCY, URINE: PREG TEST UR: NEGATIVE

## 2018-05-30 MED ORDER — LEVONORGESTREL 19.5 MG IU IUD
19.5000 mg | INTRAUTERINE_SYSTEM | Freq: Once | INTRAUTERINE | Status: AC
  Administered 2018-05-30: 19.5 mg via INTRAUTERINE

## 2018-05-30 NOTE — Progress Notes (Signed)
  Patient was advised about spotting for the first month and about the risks and benefits of an IUD and she is understanding and wants to go ahead with the procedure  IUD insertion procedure: Patient was placed in stirrups and use a speculum. Patient was prepped with Betadine swabs using cotton balls. Tenaculum was used to grab the anterior cervix. Uterine sound was performed and found to be 7cm in length and anterior flexed. Cervical dilation was not necessary. Mirena was placed using factory device at the correct depth that was measured and deployed without issue. The strings were cut leaving 1.5 cm extra. Patient tolerated procedure well and bleeding was minimal.   Problem List Items Addressed This Visit    None    Visit Diagnoses    Encounter for IUD insertion    -  Primary   Relevant Medications   Levonorgestrel IUD 19.5 mg (Start on 05/30/2018  9:45 AM)   Birth control counseling       Relevant Medications   Levonorgestrel IUD 19.5 mg (Start on 05/30/2018  9:45 AM)   Other Relevant Orders   Pregnancy, urine

## 2018-06-09 ENCOUNTER — Encounter: Payer: 59 | Admitting: Family Medicine

## 2018-07-11 ENCOUNTER — Encounter: Payer: Self-pay | Admitting: Family Medicine

## 2018-07-12 MED ORDER — AZITHROMYCIN 250 MG PO TABS: ORAL_TABLET | ORAL | 0 refills | Status: DC

## 2018-07-20 ENCOUNTER — Encounter: Payer: Self-pay | Admitting: Family Medicine

## 2018-09-20 ENCOUNTER — Encounter: Payer: Self-pay | Admitting: Family Medicine

## 2018-10-05 ENCOUNTER — Telehealth: Payer: Self-pay | Admitting: Family Medicine

## 2018-10-07 ENCOUNTER — Other Ambulatory Visit: Payer: Self-pay

## 2018-10-07 ENCOUNTER — Encounter: Payer: Self-pay | Admitting: Family Medicine

## 2018-10-07 ENCOUNTER — Ambulatory Visit (INDEPENDENT_AMBULATORY_CARE_PROVIDER_SITE_OTHER): Payer: 59 | Admitting: Family Medicine

## 2018-10-07 DIAGNOSIS — K219 Gastro-esophageal reflux disease without esophagitis: Secondary | ICD-10-CM

## 2018-10-07 DIAGNOSIS — I10 Essential (primary) hypertension: Secondary | ICD-10-CM

## 2018-10-07 MED ORDER — LORATADINE 10 MG PO TABS: 10.0000 mg | ORAL_TABLET | Freq: Every day | ORAL | 11 refills | Status: DC

## 2018-10-07 NOTE — Progress Notes (Signed)
   Virtual Visit via telephone Note  I connected with Amy Nichols on 10/07/18 at 0803 by telephone and verified that I am speaking with the correct person using two identifiers. Amy Nichols is currently located at home and no other people are currently with her during visit. The provider, [name] Dettinger, MD is located in their office at time of visit.  Call ended at [phone]  I discussed the limitations, risks, security and privacy concerns of performing an evaluation and management service by telephone and the availability of in person appointments. I also discussed with the patient that there may be a patient responsible charge related to this service. The patient expressed understanding and agreed to proceed.   History and Present Illness: GERD Patient is currently on omeprazole.  She denies any major symptoms or abdominal pain or belching or burping. She denies any blood in her stool or lightheadedness or dizziness.   Hypertension Patient is currently on no medication, and their blood pressure today is unknown because she cant find her cuff. Patient denies any lightheadedness or dizziness. Patient denies headaches, blurred vision, chest pains, shortness of breath, or weakness. Denies any side effects from medication and is content with current medication.   Patient has been trying diet and walking  No diagnosis found.  Outpatient Encounter Medications as of 10/07/2018  Medication Sig  . azithromycin (ZITHROMAX) 250 MG tablet Take 2 the first day and then one each day after.  Marland Kitchen omeprazole (PRILOSEC) 40 MG capsule Take 1 capsule (40 mg total) by mouth daily.   No facility-administered encounter medications on file as of 10/07/2018.     Review of Systems  Constitutional: Negative for chills and fever.  Eyes: Negative for visual disturbance.  Respiratory: Negative for chest tightness and shortness of breath.   Cardiovascular: Negative for chest pain and leg swelling.   Musculoskeletal: Negative for back pain and gait problem.  Skin: Negative for rash.  Neurological: Negative for light-headedness and headaches.  Psychiatric/Behavioral: Negative for agitation, behavioral problems, dysphoric mood, self-injury, sleep disturbance and suicidal ideas. The patient is not nervous/anxious.   All other systems reviewed and are negative.   Observations/Objective: Patient sounds comfortable and in no acute distress  Assessment and Plan: Problem List Items Addressed This Visit      Cardiovascular and Mediastinum   Essential hypertension     Digestive   GERD without esophagitis - Primary     Other   Morbid obesity (HCC)       Follow Up Instructions:  Follow up in 6 months with blood work and physical   I discussed the assessment and treatment plan with the patient. The patient was provided an opportunity to ask questions and all were answered. The patient agreed with the plan and demonstrated an understanding of the instructions.   The patient was advised to call back or seek an in-person evaluation if the symptoms worsen or if the condition fails to improve as anticipated.  The above assessment and management plan was discussed with the patient. The patient verbalized understanding of and has agreed to the management plan. Patient is aware to call the clinic if symptoms persist or worsen. Patient is aware when to return to the clinic for a follow-up visit. Patient educated on when it is appropriate to go to the emergency department.    I provided 11 minutes of non-face-to-face time during this encounter.    Nils Pyle, MD

## 2018-10-08 ENCOUNTER — Encounter: Payer: Self-pay | Admitting: Family Medicine

## 2018-11-15 ENCOUNTER — Telehealth: Payer: 59 | Admitting: Family

## 2018-11-15 DIAGNOSIS — J069 Acute upper respiratory infection, unspecified: Secondary | ICD-10-CM

## 2018-11-15 MED ORDER — PROMETHAZINE-DM 6.25-15 MG/5ML PO SYRP: 5.0000 mL | ORAL_SOLUTION | Freq: Four times a day (QID) | ORAL | 0 refills | Status: DC | PRN

## 2018-11-15 NOTE — Progress Notes (Signed)
E-Visit for Corona Virus Screening   Based on your current symptoms, it seems unlikely that your symptoms are related to the Coronavirus.    This sounds like an upper respiratory virus.   COVID-19 is a respiratory illness with symptoms that are similar to the flu. Symptoms are typically mild to moderate, but there have been cases of severe illness and death due to the virus. The following symptoms may appear 2-14 days after exposure: . Fever . Cough . Shortness of breath or difficulty breathing . Chills . Repeated shaking with chills . Muscle pain . Headache . Sore throat . New loss of taste or smell . Fatigue . Congestion or runny nose . Nausea or vomiting . Diarrhea  It is vitally important that if you feel that you have an infection such as this virus or any other virus that you stay home and away from places where you may spread it to others.  You should self-quarantine for 14 days if you have symptoms that could potentially be coronavirus or have been in close contact a with a person diagnosed with COVID-19 within the last 2 weeks. You should avoid contact with people age 25 and older.   You should wear a mask or cloth face covering over your nose and mouth if you must be around other people or animals, including pets (even at home). Try to stay at least 6 feet away from other people. This will protect the people around you.  You can use medication such as A prescription cough medication called Phenergan DM 6.25 mg/15 mg. You make take one teaspoon / 5 ml every 4-6 hours as needed for cough  You may also take acetaminophen (Tylenol) as needed for fever.   Reduce your risk of any infection by using the same precautions used for avoiding the common cold or flu:  Marland Kitchen. Wash your hands often with soap and warm water for at least 20 seconds.  If soap and water are not readily available, use an alcohol-based hand sanitizer with at least 60% alcohol.  . If coughing or sneezing, cover your  mouth and nose by coughing or sneezing into the elbow areas of your shirt or coat, into a tissue or into your sleeve (not your hands). . Avoid shaking hands with others and consider head nods or verbal greetings only. . Avoid touching your eyes, nose, or mouth with unwashed hands.  . Avoid close contact with people who are sick. . Avoid places or events with large numbers of people in one location, like concerts or sporting events. . Carefully consider travel plans you have or are making. . If you are planning any travel outside or inside the [place], visit the CDC's Travelers' Health webpage for the latest health notices. . If you have some symptoms but not all symptoms, continue to monitor at home and seek medical attention if your symptoms worsen. . If you are having a medical emergency, call 911.  HOME CARE . Only take medications as instructed by your medical team. . Drink plenty of fluids and get plenty of rest. . A steam or ultrasonic humidifier can help if you have congestion.   GET HELP RIGHT AWAY IF YOU HAVE EMERGENCY WARNING SIGNS** FOR COVID-19. If you or someone is showing any of these signs seek emergency medical care immediately. Call 911 or proceed to your closest emergency facility if: . You develop worsening high fever. . Trouble breathing . Bluish lips or face . Persistent pain or pressure in the  chest . New confusion . Inability to wake or stay awake . You cough up blood. . Your symptoms become more severe  **This list is not all possible symptoms. Contact your medical provider for any symptoms that are sever or concerning to you.   MAKE SURE YOU   Understand these instructions.  Will watch your condition.  Will get help right away if you are not doing well or get worse.  Your e-visit answers were reviewed by a board certified advanced clinical practitioner to complete your personal care plan.  Depending on the condition, your plan could have included both over the  counter or prescription medications.  If there is a problem please reply once you have received a response from your provider.  Your safety is important to [name].  If you have drug allergies check your prescription carefully.    You can use MyChart to ask questions about today's visit, request a non-urgent call back, or ask for a work or school excuse for 24 hours related to this e-Visit. If it has been greater than 24 hours you will need to follow up with your provider, or enter a new e-Visit to address those concerns. You will get an e-mail in the next two days asking about your experience.  I hope that your e-visit has been valuable and will speed your recovery. Thank you for using e-visits.  Greater than 5 minutes, yet less than 10 minutes of time have been spent researching, coordinating, and implementing care for this patient today.  Thank you for the details you included in the comment boxes. Those details are very helpful in determining the best course of treatment for you and help [name] to provide the best care.

## 2019-07-26 ENCOUNTER — Ambulatory Visit: Payer: 59 | Attending: Internal Medicine

## 2019-07-26 ENCOUNTER — Other Ambulatory Visit: Payer: Self-pay

## 2019-07-26 DIAGNOSIS — Z20822 Contact with and (suspected) exposure to covid-19: Secondary | ICD-10-CM

## 2019-07-27 LAB — NOVEL CORONAVIRUS, NAA: SARS-CoV-2, NAA: NOT DETECTED

## 2019-08-09 ENCOUNTER — Other Ambulatory Visit: Payer: Self-pay | Admitting: Family Medicine

## 2019-08-15 ENCOUNTER — Encounter: Payer: Self-pay | Admitting: Family Medicine

## 2019-08-22 ENCOUNTER — Other Ambulatory Visit: Payer: Self-pay | Admitting: Family Medicine

## 2019-09-05 ENCOUNTER — Ambulatory Visit (INDEPENDENT_AMBULATORY_CARE_PROVIDER_SITE_OTHER): Payer: 59 | Admitting: Family Medicine

## 2019-09-05 ENCOUNTER — Encounter: Payer: Self-pay | Admitting: Family Medicine

## 2019-09-05 ENCOUNTER — Other Ambulatory Visit: Payer: Self-pay

## 2019-09-05 VITALS — BP 120/78 | HR 92 | Temp 97.7°F | Ht 69.0 in | Wt 292.0 lb

## 2019-09-05 DIAGNOSIS — F419 Anxiety disorder, unspecified: Secondary | ICD-10-CM | POA: Diagnosis not present

## 2019-09-05 DIAGNOSIS — R590 Localized enlarged lymph nodes: Secondary | ICD-10-CM

## 2019-09-05 MED ORDER — SERTRALINE HCL 50 MG PO TABS: 50.0000 mg | ORAL_TABLET | Freq: Every day | ORAL | 1 refills | Status: DC

## 2019-09-05 NOTE — Progress Notes (Signed)
BP 120/78   Pulse 92   Temp 97.7 F (36.5 C)   Ht _0  (1.753 m)   Wt 292 lb (132.5 kg)   LMP 08/10/2019 Comment: Has Kyleena  SpO2 97%   BMI 43.12 kg/m    Subjective:   Patient ID: Amy Nichols, female    DOB: [birth year], 24 y.o.   MRN: [id number]  HPI: Amy Nichols is a 24 y.o. female presenting on 09/05/2019 for Concerned about thyroid, Lump right neck, and Anxiety   HPI Anxiety Patient is coming to discuss anxiety and she says she has been having more nervousness and anxiousness over the past few months and has been building up.  Its been worse over the past year as well.  She has been having some home concerns and work issues.  She has been chewing gum to help with her anxiety.  She says her brain is just running all the time and sometimes will even make her forget stuff.  She is sleeping well but she still wakes up tired and exhausted even when she sleeps she does have increased appetite has been gaining weight.  Patient denies any suicidal ideations or thoughts of hurt herself. Depression screen [facility] 2/9 09/05/2019 05/30/2018 05/13/2018 05/06/2018 04/08/2018  Decreased Interest 0 0 0 0 0  Down, Depressed, Hopeless 0 0 0 0 0  PHQ - 2 Score 0 0 0 0 0    Lump in neck Patient has a lump on the right lateral aspect of her neck that is been there over the past year, she says it came bigger and then is got smaller but she still feels like there has not gone away.  There is a family history of thyroid disease and she was worried that it might be related to that.  Relevant past medical, surgical, family and social history reviewed and updated as indicated. Interim medical history since our last visit reviewed. Allergies and medications reviewed and updated.  Review of Systems  Constitutional: Negative for chills and fever.  Eyes: Negative for visual disturbance.  Respiratory: Negative for chest tightness and shortness of breath.   Cardiovascular: Negative for chest pain and leg  swelling.  Musculoskeletal: Negative for back pain and gait problem.  Skin: Negative for rash.  Neurological: Negative for light-headedness and headaches.  Psychiatric/Behavioral: Negative for agitation, behavioral problems, decreased concentration, dysphoric mood, self-injury, sleep disturbance and suicidal ideas. The patient is nervous/anxious.   All other systems reviewed and are negative.   Per HPI unless specifically indicated above   Allergies as of 09/05/2019   No Known Allergies     Medication List       Accurate as of September 05, 2019  3:23 PM. If you have any questions, ask your nurse or doctor.        STOP taking these medications   promethazine-dextromethorphan 6.25-15 MG/5ML syrup Commonly known as: PROMETHAZINE-DM Stopped by: Fransisca Kaufmann Prerna Harold, MD     TAKE these medications   loratadine 10 MG tablet Commonly known as: CLARITIN Take 1 tablet (10 mg total) by mouth daily.   omeprazole 40 MG capsule Commonly known as: PRILOSEC Take 1 capsule (40 mg total) by mouth daily. (Needs to be seen before next refill)   PROBIOTIC-10 PO Take by mouth daily.   sertraline 50 MG tablet Commonly known as: Zoloft Take 1 tablet (50 mg total) by mouth daily. Started by: Worthy Rancher, MD        Objective:   BP 120/78  Pulse 92   Temp 97.7 F (36.5 C)   Ht _0  (1.753 m)   Wt 292 lb (132.5 kg)   LMP 08/10/2019 Comment: Has Kyleena  SpO2 97%   BMI 43.12 kg/m   Wt Readings from Last 3 Encounters:  09/05/19 292 lb (132.5 kg)  05/30/18 272 lb (123.4 kg)  05/13/18 270 lb 9.6 oz (122.7 kg)    Physical Exam Vitals and nursing note reviewed.  Constitutional:      General: She is not in acute distress.    Appearance: She is well-developed. She is not diaphoretic.  Eyes:     Conjunctiva/sclera: Conjunctivae normal.  Cardiovascular:     Rate and Rhythm: Normal rate and regular rhythm.     Heart sounds: Normal heart sounds. No murmur.  Pulmonary:      Effort: Pulmonary effort is normal. No respiratory distress.     Breath sounds: Normal breath sounds. No wheezing.  Musculoskeletal:        General: No tenderness. Normal range of motion.  Lymphadenopathy:     Cervical: Cervical adenopathy present.     Right cervical: Posterior cervical adenopathy (Small mobile and nontender, reassurance only) present. No superficial or deep cervical adenopathy.    Left cervical: No superficial, deep or posterior cervical adenopathy.  Skin:    General: Skin is warm and dry.     Findings: No rash.  Neurological:     Mental Status: She is alert and oriented to person, place, and time.     Coordination: Coordination normal.  Psychiatric:        Behavior: Behavior normal.       Assessment & Plan:   Problem List Items Addressed This Visit    None    Visit Diagnoses    Anxiety    -  Primary   Relevant Medications   sertraline (ZOLOFT) 50 MG tablet   Other Relevant Orders   CBC with Differential/Platelet   CMP14+EGFR   TSH   Posterior cervical lymphadenopathy        Reassurance on lymph node, will start Zoloft for anxiety see if it helps and see back in 3 to 4 weeks.  Follow up plan: Return if symptoms worsen or fail to improve, for Return in 3 to 5 weeks for anxiety recheck.  Counseling provided for all of the vaccine components No orders of the defined types were placed in this encounter.   Caryl Pina, MD [facility] 09/05/2019, 3:23 PM

## 2019-09-06 LAB — CMP14+EGFR
ALT: 27 IU/L (ref 0–32)
AST: 28 IU/L (ref 0–40)
Albumin/Globulin Ratio: 1.1 — ABNORMAL LOW (ref 1.2–2.2)
Albumin: 4.2 g/dL (ref 3.9–5.0)
Alkaline Phosphatase: 62 IU/L (ref 39–117)
BUN/Creatinine Ratio: 9 (ref 9–23)
BUN: 7 mg/dL (ref 6–20)
Bilirubin Total: 1.1 mg/dL (ref 0.0–1.2)
CO2: 20 mmol/L (ref 20–29)
Calcium: 9.4 mg/dL (ref 8.7–10.2)
Chloride: 103 mmol/L (ref 96–106)
Creatinine, Ser: 0.77 mg/dL (ref 0.57–1.00)
GFR calc Af Amer: 123 mL/min/{1.73_m2} (ref >59)
GFR calc non Af Amer: 107 mL/min/{1.73_m2} (ref >59)
Globulin, Total: 3.7 g/dL (ref 1.5–4.5)
Glucose: 91 mg/dL (ref 65–99)
Potassium: 4.1 mmol/L (ref 3.5–5.2)
Sodium: 137 mmol/L (ref 134–144)
Total Protein: 7.9 g/dL (ref 6.0–8.5)

## 2019-09-06 LAB — CBC WITH DIFFERENTIAL/PLATELET
Basophils Absolute: 0 10*3/uL (ref 0.0–0.2)
Basos: 0 %
EOS (ABSOLUTE): 0.1 10*3/uL (ref 0.0–0.4)
Eos: 1 %
Hematocrit: 39.2 % (ref 34.0–46.6)
Hemoglobin: 12.9 g/dL (ref 11.1–15.9)
Immature Grans (Abs): 0 10*3/uL (ref 0.0–0.1)
Immature Granulocytes: 0 %
Lymphocytes Absolute: 2.6 10*3/uL (ref 0.7–3.1)
Lymphs: 39 %
MCH: 28.1 pg (ref 26.6–33.0)
MCHC: 32.9 g/dL (ref 31.5–35.7)
MCV: 85 fL (ref 79–97)
Monocytes Absolute: 0.5 10*3/uL (ref 0.1–0.9)
Monocytes: 8 %
Neutrophils Absolute: 3.5 10*3/uL (ref 1.4–7.0)
Neutrophils: 52 %
Platelets: 140 10*3/uL — ABNORMAL LOW (ref 150–450)
RBC: 4.59 x10E6/uL (ref 3.77–5.28)
RDW: 13.1 % (ref 11.7–15.4)
WBC: 6.6 10*3/uL (ref 3.4–10.8)

## 2019-09-06 LAB — TSH: TSH: 1.13 u[IU]/mL (ref 0.450–4.500)

## 2019-09-09 ENCOUNTER — Encounter: Payer: Self-pay | Admitting: Family Medicine

## 2019-09-11 ENCOUNTER — Telehealth: Payer: Self-pay | Admitting: Family Medicine

## 2019-09-11 DIAGNOSIS — F419 Anxiety disorder, unspecified: Secondary | ICD-10-CM

## 2019-09-11 NOTE — Telephone Encounter (Signed)
  REFERRAL REQUEST Telephone Note 09/11/2019  What type of referral do you need? behavirol health in [place]  Have you been seen at our office for this problem? yes (Advise that they may need an appointment with their PCP before a referral can be done)  Is there a particular doctor or location that you prefer? no  Patient notified that referrals can take up to a week or longer to process. If they haven't heard anything within a week they should call back and speak with the referral department.

## 2019-09-15 NOTE — Telephone Encounter (Signed)
Pt checking up on referral status for behavioral health referral.

## 2019-09-19 NOTE — Telephone Encounter (Signed)
I do not see where a Ref was placed for this?

## 2019-09-21 NOTE — Telephone Encounter (Signed)
Placed referral to behavioral health for the patient

## 2019-09-21 NOTE — Telephone Encounter (Signed)
Can you place another ref and make it external - I do not see the Ref in my que.

## 2019-09-27 ENCOUNTER — Telehealth (INDEPENDENT_AMBULATORY_CARE_PROVIDER_SITE_OTHER): Payer: 59 | Admitting: Licensed Clinical Social Worker

## 2019-09-27 ENCOUNTER — Telehealth: Payer: Self-pay | Admitting: Licensed Clinical Social Worker

## 2019-09-27 DIAGNOSIS — F419 Anxiety disorder, unspecified: Secondary | ICD-10-CM

## 2019-09-27 NOTE — Telephone Encounter (Signed)
-----   Message from Neysa Hotter, MD sent at 09/26/2019 12:29 PM EDT ----- Regarding: [facility] referral Could you contact her for [facility]? thanks

## 2019-09-27 NOTE — Telephone Encounter (Signed)
Call at 10am Patient answered the phone and Writer began discussing [facility] services.  Phone call was disconnected and no answer when Writer returned call.

## 2019-09-28 ENCOUNTER — Other Ambulatory Visit: Payer: Self-pay | Admitting: Family Medicine

## 2019-09-28 DIAGNOSIS — F419 Anxiety disorder, unspecified: Secondary | ICD-10-CM

## 2019-09-29 ENCOUNTER — Other Ambulatory Visit: Payer: Self-pay

## 2019-09-29 ENCOUNTER — Ambulatory Visit (INDEPENDENT_AMBULATORY_CARE_PROVIDER_SITE_OTHER): Payer: 59 | Admitting: Family Medicine

## 2019-09-29 ENCOUNTER — Encounter: Payer: Self-pay | Admitting: Family Medicine

## 2019-09-29 VITALS — BP 123/79 | HR 88 | Temp 98.3°F | Ht 69.0 in | Wt 281.2 lb

## 2019-09-29 DIAGNOSIS — F419 Anxiety disorder, unspecified: Secondary | ICD-10-CM

## 2019-09-29 MED ORDER — SERTRALINE HCL 50 MG PO TABS: 50.0000 mg | ORAL_TABLET | Freq: Every day | ORAL | 1 refills | Status: DC

## 2019-09-29 NOTE — Progress Notes (Signed)
BP 123/79   Pulse 88   Temp 98.3 F (36.8 C) (Temporal)   Ht 5\' 9"  (1.753 m)   Wt 281 lb 4 oz (127.6 kg)   BMI 41.53 kg/m    Subjective:   Patient ID: Amy Nichols, female    DOB: [birth year], 24 y.o.   MRN: [id number]  HPI: Amy Nichols is a 24 y.o. female presenting on 09/29/2019 for Medical Management of Chronic Issues   HPI Anxiety recheck Patient is coming in for anxiety recheck.  She feels like she is doing very well and she has more motivation to get up and go and feels like the sertraline is doing really well for her.  She denies any suicidal ideations.  She did start taking the sertraline at night because it was sedating but she is doing fine with that. Depression screen [facility] 2/9 09/29/2019 09/05/2019 05/30/2018 05/13/2018 05/06/2018  Decreased Interest 0 0 0 0 0  Down, Depressed, Hopeless 0 0 0 0 0  PHQ - 2 Score 0 0 0 0 0  Altered sleeping 0 - - - -  Tired, decreased energy 0 - - - -  Change in appetite 0 - - - -  Feeling bad or failure about yourself  0 - - - -  Trouble concentrating 0 - - - -  Moving slowly or fidgety/restless 0 - - - -  Suicidal thoughts 0 - - - -  PHQ-9 Score 0 - - - -  Difficult doing work/chores Not difficult at all - - - -     Relevant past medical, surgical, family and social history reviewed and updated as indicated. Interim medical history since our last visit reviewed. Allergies and medications reviewed and updated.  Review of Systems  Constitutional: Negative for chills and fever.  Eyes: Negative for visual disturbance.  Respiratory: Negative for chest tightness and shortness of breath.   Cardiovascular: Negative for chest pain and leg swelling.  Skin: Negative for rash.  Neurological: Negative for light-headedness and headaches.  Psychiatric/Behavioral: Negative for agitation, behavioral problems, dysphoric mood, self-injury, sleep disturbance and suicidal ideas. The patient is nervous/anxious.   All other systems reviewed and are  negative.   Per HPI unless specifically indicated above   Allergies as of 09/29/2019   No Known Allergies     Medication List       Accurate as of Sep 29, 2019 12:05 PM. If you have any questions, ask your nurse or doctor.        loratadine 10 MG tablet Commonly known as: CLARITIN Take 1 tablet (10 mg total) by mouth daily.   omeprazole 40 MG capsule Commonly known as: PRILOSEC Take 1 capsule (40 mg total) by mouth daily.   PROBIOTIC-10 PO Take by mouth daily.   sertraline 50 MG tablet Commonly known as: ZOLOFT Take 1 tablet (50 mg total) by mouth daily.        Objective:   BP 123/79   Pulse 88   Temp 98.3 F (36.8 C) (Temporal)   Ht 5\' 9"  (1.753 m)   Wt 281 lb 4 oz (127.6 kg)   BMI 41.53 kg/m   Wt Readings from Last 3 Encounters:  09/29/19 281 lb 4 oz (127.6 kg)  09/05/19 292 lb (132.5 kg)  05/30/18 272 lb (123.4 kg)    Physical Exam Vitals and nursing note reviewed.  Constitutional:      General: She is not in acute distress.    Appearance: She is well-developed. She is not  diaphoretic.  Eyes:     Conjunctiva/sclera: Conjunctivae normal.  Cardiovascular:     Rate and Rhythm: Normal rate and regular rhythm.     Heart sounds: Normal heart sounds. No murmur.  Pulmonary:     Effort: Pulmonary effort is normal. No respiratory distress.     Breath sounds: Normal breath sounds. No wheezing.  Musculoskeletal:        General: No tenderness. Normal range of motion.  Skin:    General: Skin is warm and dry.     Findings: No rash.  Neurological:     Mental Status: She is alert and oriented to person, place, and time.     Coordination: Coordination normal.  Psychiatric:        Mood and Affect: Mood is anxious. Mood is not depressed.        Behavior: Behavior normal.        Thought Content: Thought content does not include suicidal ideation. Thought content does not include suicidal plan.       Assessment & Plan:   Problem List Items Addressed This  Visit    None    Visit Diagnoses    Anxiety    -  Primary   Relevant Medications   sertraline (ZOLOFT) 50 MG tablet      Patient doing well, continue Zoloft, see back in 3 months Follow up plan: Return in about 3 months (around 12/30/2019), or if symptoms worsen or fail to improve, for Anxiety.  Counseling provided for all of the vaccine components No orders of the defined types were placed in this encounter.   Caryl Pina, MD [facility] 09/29/2019, 12:05 PM

## 2019-10-04 ENCOUNTER — Telehealth: Payer: Self-pay | Admitting: Licensed Clinical Social Worker

## 2019-10-04 ENCOUNTER — Telehealth (INDEPENDENT_AMBULATORY_CARE_PROVIDER_SITE_OTHER): Payer: 59 | Admitting: Licensed Clinical Social Worker

## 2019-10-04 DIAGNOSIS — F419 Anxiety disorder, unspecified: Secondary | ICD-10-CM

## 2019-10-04 NOTE — Telephone Encounter (Signed)
Patient called back requesting to complete initial intake on Thursday Oct 05, 2019 at 3p

## 2019-10-04 NOTE — Telephone Encounter (Signed)
Left message encouraging contact 

## 2019-10-15 ENCOUNTER — Telehealth: Payer: 59 | Admitting: Family

## 2019-10-15 DIAGNOSIS — M533 Sacrococcygeal disorders, not elsewhere classified: Secondary | ICD-10-CM

## 2019-10-15 MED ORDER — BACLOFEN 10 MG PO TABS: 10.0000 mg | ORAL_TABLET | Freq: Three times a day (TID) | ORAL | 0 refills | Status: DC

## 2019-10-15 MED ORDER — ETODOLAC 300 MG PO CAPS: 300.0000 mg | ORAL_CAPSULE | Freq: Three times a day (TID) | ORAL | 0 refills | Status: DC

## 2019-10-15 NOTE — Progress Notes (Signed)

## 2019-10-23 ENCOUNTER — Telehealth (INDEPENDENT_AMBULATORY_CARE_PROVIDER_SITE_OTHER): Payer: 59 | Admitting: Licensed Clinical Social Worker

## 2019-10-23 DIAGNOSIS — F419 Anxiety disorder, unspecified: Secondary | ICD-10-CM

## 2019-10-23 NOTE — Telephone Encounter (Signed)
Attempted to make contact with Patient to begin [facility] services. Left message encouraging contact.  Will call again

## 2019-10-25 ENCOUNTER — Telehealth: Payer: Self-pay | Admitting: Licensed Clinical Social Worker

## 2019-10-25 ENCOUNTER — Telehealth (INDEPENDENT_AMBULATORY_CARE_PROVIDER_SITE_OTHER): Payer: 59 | Admitting: Licensed Clinical Social Worker

## 2019-10-25 DIAGNOSIS — F419 Anxiety disorder, unspecified: Secondary | ICD-10-CM

## 2019-10-25 NOTE — Telephone Encounter (Signed)
Left message encouraging contact to initiate [facility] services.

## 2019-10-28 ENCOUNTER — Other Ambulatory Visit: Payer: Self-pay | Admitting: Family Medicine

## 2019-11-01 ENCOUNTER — Encounter: Payer: Self-pay | Admitting: Family Medicine

## 2019-11-06 MED ORDER — NORGESTIMATE-ETH ESTRADIOL 0.25-35 MG-MCG PO TABS: 1.0000 | ORAL_TABLET | Freq: Every day | ORAL | 0 refills | Status: DC

## 2019-11-20 ENCOUNTER — Ambulatory Visit (INDEPENDENT_AMBULATORY_CARE_PROVIDER_SITE_OTHER)
Admission: RE | Admit: 2019-11-20 | Discharge: 2019-11-20 | Disposition: A | Discharge status: Home / Self Care | Payer: 59 | Source: Ambulatory Visit

## 2019-11-20 DIAGNOSIS — M545 Low back pain, unspecified: Secondary | ICD-10-CM

## 2019-11-20 MED ORDER — PREDNISONE 10 MG (21) PO TBPK
ORAL_TABLET | ORAL | 0 refills | Status: DC
Start: 2019-11-20 — End: 2019-12-27

## 2019-11-20 NOTE — ED Provider Notes (Signed)
Virtual Visit via Video Note:  Amy Nichols  initiated request for Telemedicine visit with [name]. I connected with Amy Nichols  on 11/20/2019 at 12:39 PM  for a synchronized telemedicine visit using a video enabled HIPPA compliant telemedicine application. I verified that I am speaking with Amy Nichols  using two identifiers. Durward Parcel, FNP  was physically located in a [facility] Urgent care site and Alexah Kivett was located at a different location.   The limitations of evaluation and management by telemedicine as well as the availability of in-person appointments were discussed. Patient was informed that she  may incur a bill ( including co-pay) for this virtual visit encounter. Amy Nichols  expressed understanding and gave verbal consent to proceed with virtual visit.     History of Present Illness:Amy Nichols  is a 24 y.o. female who presented via telehealth with a complaint of right low back pain for the past few days.  Denies precipitating event.  She localized pain to the right low back.  She describes the pain as constant and achy.  He has tried OTC medications and baclofen without relief.  Her symptoms are made worse with ROM.  She denies similar symptoms in the past.  Denies chills, fever, nausea, vomiting, diarrhea   Past Medical History:  Diagnosis Date  . GERD (gastroesophageal reflux disease)   . Hypertension     No Known Allergies      Observations/Objective: VITALS: Per patient if applicable, see vitals. GENERAL: Alert, appears well and in no acute distress. HEENT: Atraumatic, conjunctiva clear, no obvious abnormalities on inspection of external nose and ears. NECK: Normal movements of the head and neck. CARDIOPULMONARY: No increased WOB. Speaking in clear sentences. I:E ratio WNL.  MS: Moves all visible extremities without noticeable abnormality. PSYCH: Pleasant and cooperative, well-groomed. Speech normal rate and rhythm.  Affect is appropriate. Insight and judgement are appropriate. Attention is focused, linear, and appropriate.  NEURO: CN grossly intact. Oriented as arrived to appointment on time with no prompting. Moves both UE equally.  SKIN: No obvious lesions, wounds, erythema, or cyanosis noted on face or hands.    Assessment and Plan:   ICD-10-CM   1. Acute right-sided low back pain without sciatica  M54.5     Follow Up Instructions: Follow-up with PCP Return for face-to-face visit if symptom does not resolve Continue baclofen as prescribed Take OTC Tylenol as needed for pain    I discussed the assessment and treatment plan with the patient. The patient was provided an opportunity to ask questions and all were answered. The patient agreed with the plan and demonstrated an understanding of the instructions.   The patient was advised to call back or seek an in-person evaluation if the symptoms worsen or if the condition fails to improve as anticipated.  I provided 15 minutes of non-face-to-face time during this encounter.    Durward Parcel, FNP  11/20/2019 12:41 PM         Durward Parcel, FNP 11/20/19 1313

## 2019-11-20 NOTE — Discharge Instructions (Addendum)
Follow-up with PCP Return for face-to-face visit if symptom does not resolve Continue baclofen as prescribed Take OTC Tylenol as needed for pain

## 2019-11-22 ENCOUNTER — Telehealth: Payer: Self-pay | Admitting: Licensed Clinical Social Worker

## 2019-11-22 NOTE — Telephone Encounter (Signed)
Attempt to make contact unsuccessful; line busy

## 2019-11-30 ENCOUNTER — Other Ambulatory Visit: Payer: Self-pay | Admitting: Family Medicine

## 2019-12-02 ENCOUNTER — Other Ambulatory Visit: Payer: Self-pay | Admitting: Family Medicine

## 2019-12-03 ENCOUNTER — Encounter: Payer: Self-pay | Admitting: Family Medicine

## 2019-12-06 ENCOUNTER — Telehealth (INDEPENDENT_AMBULATORY_CARE_PROVIDER_SITE_OTHER): Payer: 59 | Admitting: Family Medicine

## 2019-12-06 ENCOUNTER — Encounter: Payer: Self-pay | Admitting: Family Medicine

## 2019-12-06 DIAGNOSIS — J358 Other chronic diseases of tonsils and adenoids: Secondary | ICD-10-CM

## 2019-12-06 DIAGNOSIS — J36 Peritonsillar abscess: Secondary | ICD-10-CM

## 2019-12-06 MED ORDER — AMOXICILLIN-POT CLAVULANATE 875-125 MG PO TABS: 1.0000 | ORAL_TABLET | Freq: Two times a day (BID) | ORAL | 0 refills | Status: DC

## 2019-12-06 NOTE — Progress Notes (Signed)
Virtual Visit via Video note  I connected with Amy Nichols on 12/06/19 at 1:59 PM by video and verified that I am speaking with the correct person using two identifiers. Amy Nichols is currently located at work and nobody is currently with her during visit. The provider, Gwenlyn Fudge, FNP is located in their office at time of visit.  I discussed the limitations, risks, security and privacy concerns of performing an evaluation and management service by video and the availability of in person appointments. I also discussed with the patient that there may be a patient responsible charge related to this service. The patient expressed understanding and agreed to proceed.  Subjective: PCP: Dettinger, [name], MD  Chief Complaint  Patient presents with  . Sore Throat   Patient c/o a sore throat, swollen lymph nodes, erythema, tonsil stones, and her ears popping. Denies fever. She is also interested in a referral to ENT to discuss having tonsils and adenoids removed due to recurrent issues.    ROS: Per HPI  Current Outpatient Medications:  .  baclofen (LIORESAL) 10 MG tablet, Take 1 tablet (10 mg total) by mouth 3 (three) times daily., Disp: 30 each, Rfl: 0 .  etodolac (LODINE) 300 MG capsule, Take 1 capsule (300 mg total) by mouth every 8 (eight) hours., Disp: 30 capsule, Rfl: 0 .  loratadine (CLARITIN) 10 MG tablet, Take 1 tablet (10 mg total) by mouth daily., Disp: 30 tablet, Rfl: 11 .  omeprazole (PRILOSEC) 40 MG capsule, TAKE 1 CAPSULE BY MOUTH EVERY DAY, Disp: 30 capsule, Rfl: 0 .  predniSONE (STERAPRED UNI-PAK 21 TAB) 10 MG (21) TBPK tablet, Take 6 tabs by mouth daily  for 2 days, then 5 tabs for 2 days, then 4 tabs for 2 days, then 3 tabs for 2 days, 2 tabs for 2 days, then 1 tab by mouth daily for 2 days, Disp: 42 tablet, Rfl: 0 .  Probiotic Product (PROBIOTIC-10 PO), Take by mouth daily., Disp: , Rfl:  .  sertraline (ZOLOFT) 50 MG tablet, Take 1 tablet (50 mg total) by mouth  daily., Disp: 90 tablet, Rfl: 1 .  SPRINTEC 28 0.25-35 MG-MCG tablet, TAKE 1 TABLET BY MOUTH EVERY DAY, Disp: 28 tablet, Rfl: 0  No Known Allergies Past Medical History:  Diagnosis Date  . GERD (gastroesophageal reflux disease)   . Hypertension     Observations/Objective: Physical Exam Constitutional:      General: She is not in acute distress.    Appearance: Normal appearance. She is not ill-appearing or toxic-appearing.  Eyes:     General: No scleral icterus.       Right eye: No discharge.        Left eye: No discharge.     Conjunctiva/sclera: Conjunctivae normal.  Pulmonary:     Effort: Pulmonary effort is normal. No respiratory distress.  Neurological:     Mental Status: She is alert and oriented to person, place, and time.  Psychiatric:        Mood and Affect: Mood normal.        Behavior: Behavior normal.        Thought Content: Thought content normal.        Judgment: Judgment normal.      Assessment and Plan: 1. Tonsillar abscess - amoxicillin-clavulanate (AUGMENTIN) 875-125 MG tablet; Take 1 tablet by mouth 2 (two) times daily for 14 days.  Dispense: 28 tablet; Refill: 0 - Ambulatory referral to ENT  2. Tonsil stone - Ambulatory referral to  ENT   Follow Up Instructions:   I discussed the assessment and treatment plan with the patient. The patient was provided an opportunity to ask questions and all were answered. The patient agreed with the plan and demonstrated an understanding of the instructions.   The patient was advised to call back or seek an in-person evaluation if the symptoms worsen or if the condition fails to improve as anticipated.  The above assessment and management plan was discussed with the patient. The patient verbalized understanding of and has agreed to the management plan. Patient is aware to call the clinic if symptoms persist or worsen. Patient is aware when to return to the clinic for a follow-up visit. Patient educated on when it is  appropriate to go to the emergency department.   Time call ended: 2:05 PM  I provided 8 minutes of face-to-face time during this encounter.   Deliah Boston, MSN, APRN, FNP-C [facility] 12/06/19

## 2019-12-14 ENCOUNTER — Other Ambulatory Visit: Payer: Self-pay | Admitting: Family Medicine

## 2019-12-17 ENCOUNTER — Other Ambulatory Visit: Payer: Self-pay | Admitting: Family Medicine

## 2019-12-20 ENCOUNTER — Ambulatory Visit (INDEPENDENT_AMBULATORY_CARE_PROVIDER_SITE_OTHER)
Admission: RE | Admit: 2019-12-20 | Discharge: 2019-12-20 | Disposition: A | Discharge status: Home / Self Care | Payer: 59 | Source: Ambulatory Visit

## 2019-12-20 DIAGNOSIS — J069 Acute upper respiratory infection, unspecified: Secondary | ICD-10-CM

## 2019-12-20 MED ORDER — DM-GUAIFENESIN ER 30-600 MG PO TB12
1.0000 | ORAL_TABLET | Freq: Two times a day (BID) | ORAL | 0 refills | Status: DC
Start: 2019-12-20 — End: 2020-01-17

## 2019-12-20 MED ORDER — FLUTICASONE PROPIONATE 50 MCG/ACT NA SUSP: 1.0000 | Freq: Every day | NASAL | 2 refills | Status: DC

## 2019-12-20 MED ORDER — BENZONATATE 200 MG PO CAPS
200.0000 mg | ORAL_CAPSULE | Freq: Three times a day (TID) | ORAL | 0 refills | Status: DC | PRN
Start: 2019-12-20 — End: 2019-12-27

## 2019-12-20 NOTE — ED Provider Notes (Signed)
Virtual Visit via Video Note:  Amy Nichols  initiated request for Telemedicine visit with [facility] team. I connected with Amy Nichols  on 12/20/2019 at 11:56 AM  for a synchronized telemedicine visit using a video enabled HIPPA compliant telemedicine application. I verified that I am speaking with Amy Nichols  using two identifiers. Amy Lafoy C Skeeter Sheard, PA-C  was physically located in a [facility]gent care site and Amy Nichols was located at a different location.   The limitations of evaluation and management by telemedicine as well as the availability of in-person appointments were discussed. Patient was informed that she  may incur a bill ( including co-pay) for this virtual visit encounter. Amy Nichols  expressed understanding and gave verbal consent to proceed with virtual visit.     History of Present Illness:Amy Nichols  is a 24 y.o. female presents for evaluation of URI symptoms.  Headache, sneezing, rhinorrhea, congested, sore throat, and cough. Symptoms began 2 days ago. Denies fevers, chills, body aches. Denies GI symptoms. Close contacts with similar symptoms. Taking alka seltzers. Mucous thicker today. Denies chest pain and shortness of breath. Vaccinated, no prior covid infection. COVID test pending.  Currently on course of Augmentin.   No Known Allergies   Past Medical History:  Diagnosis Date  . GERD (gastroesophageal reflux disease)   . Hypertension      Social History   Tobacco Use  . Smoking status: Never Smoker  . Smokeless tobacco: Never Used  Vaping Use  . Vaping Use: Never used  Substance Use Topics  . Alcohol use: No  . Drug use: No        Observations/Objective: Physical Exam Constitutional:      Appearance: Normal appearance.  HENT:     Head: Normocephalic and atraumatic.     Ears:     Comments:      Mouth/Throat:     Comments: Sounds congested Eyes:     Comments: Wearing glasses  Pulmonary:     Effort:  Pulmonary effort is normal. No respiratory distress.     Comments: Speaking in full sentences Neurological:     Mental Status: She is alert.     Comments: Speech clear, face symmetric      Assessment and Plan:    ICD-10-CM   1. Viral URI with cough  J06.9      Covid test pending.  Continue symptomatic and supportive care, suspect likely viral etiology.  Rest and fluids.  Discussed strict return precautions. Patient verbalized understanding and is agreeable with plan.   Follow Up Instructions:     I discussed the assessment and treatment plan with the patient. The patient was provided an opportunity to ask questions and all were answered. The patient agreed with the plan and demonstrated an understanding of the instructions.   The patient was advised to call back or seek an in-person evaluation if the symptoms worsen or if the condition fails to improve as anticipated.      Lew Dawes, PA-C  12/20/2019 11:56 AM         Lew Dawes, PA-C 12/20/19 1205

## 2019-12-20 NOTE — Discharge Instructions (Addendum)
Rest Fluids [place] for results of COVID test Follow up for any concerns

## 2019-12-27 ENCOUNTER — Encounter: Payer: Self-pay | Admitting: Family Medicine

## 2019-12-27 ENCOUNTER — Telehealth (INDEPENDENT_AMBULATORY_CARE_PROVIDER_SITE_OTHER): Payer: 59 | Admitting: Family Medicine

## 2019-12-27 DIAGNOSIS — J069 Acute upper respiratory infection, unspecified: Secondary | ICD-10-CM

## 2019-12-27 MED ORDER — PREDNISONE 10 MG (21) PO TBPK: ORAL_TABLET | ORAL | 0 refills | Status: DC

## 2019-12-27 MED ORDER — GUAIFENESIN-CODEINE 100-10 MG/5ML PO SOLN: 5.0000 mL | Freq: Four times a day (QID) | ORAL | 0 refills | Status: DC | PRN

## 2019-12-27 NOTE — Patient Instructions (Signed)

## 2019-12-27 NOTE — Progress Notes (Signed)
MyChart Video visit  Subjective: CC: URI PCP: Dettinger, [name], MD [id number] Amy Nichols is a 24 y.o. female. Patient provides verbal consent for consult held via video.  Due to COVID-19 pandemic this visit was conducted virtually. This visit type was conducted due to national recommendations for restrictions regarding the COVID-19 Pandemic (e.g. social distancing, sheltering in place) in an effort to limit this patient's exposure and mitigate transmission in our community. All issues noted in this document were discussed and addressed.  A physical exam was not performed with this format.   Location of patient: work Location of provider: [facility] Others present for call: none  1. URI Onset of illness last Sunday.  She actually had a preceding sore throat for which she was already on amoxicillin.  She has been vaccinated against COVID and had a negative COVID test since onset of illness.   She reports cough that is worse at night time.  She reports post nasal drainage.  She has been taking loratadine.  She has been using a nose spray, OTC sinus medication.  She was prescribed Tessalon Perles by urgent care but is unable to tolerate these.  No hemoptysis, shortness of breath, wheezes or fevers.  ROS: Per HPI  No Known Allergies Past Medical History:  Diagnosis Date  . GERD (gastroesophageal reflux disease)   . Hypertension     Current Outpatient Medications:  .  baclofen (LIORESAL) 10 MG tablet, Take 1 tablet (10 mg total) by mouth 3 (three) times daily., Disp: 30 each, Rfl: 0 .  dextromethorphan-guaiFENesin (MUCINEX DM) 30-600 MG 12hr tablet, Take 1 tablet by mouth 2 (two) times daily., Disp: 20 tablet, Rfl: 0 .  etodolac (LODINE) 300 MG capsule, Take 1 capsule (300 mg total) by mouth every 8 (eight) hours., Disp: 30 capsule, Rfl: 0 .  fluticasone (FLONASE) 50 MCG/ACT nasal spray, Place 1-2 sprays into both nostrils daily., Disp: 16 g, Rfl: 2 .  loratadine (CLARITIN) 10 MG tablet, Take 1  tablet (10 mg total) by mouth daily., Disp: 30 tablet, Rfl: 11 .  omeprazole (PRILOSEC) 40 MG capsule, TAKE 1 CAPSULE BY MOUTH EVERY DAY, Disp: 90 capsule, Rfl: 0 .  Probiotic Product (PROBIOTIC-10 PO), Take by mouth daily., Disp: , Rfl:  .  sertraline (ZOLOFT) 50 MG tablet, Take 1 tablet (50 mg total) by mouth daily., Disp: 90 tablet, Rfl: 1  Gen: Well-appearing female.  No acute distress Pulmonary: Minimal coughing during exam.  Normal work of breathing on room air.  Assessment/ Plan: 24 y.o. female   1. URI with cough and congestion Continue OTC treatments.  Have added steroid Dosepak, guaifenesin with codeine given refractory symptoms to the Perles.  This is likely viral in nature.  She status post treatment with amoxicillin.  We discussed that typically this things just take time to resolve but we did discuss red flag signs and symptoms warranting further evaluation.  She was good understanding.  She will follow-up as needed Meds ordered this encounter  Medications  . predniSONE (STERAPRED UNI-PAK 21 TAB) 10 MG (21) TBPK tablet    Sig: As directed x 6 days    Dispense:  21 tablet    Refill:  0  . guaiFENesin-codeine 100-10 MG/5ML syrup    Sig: Take 5 mLs by mouth every 6 (six) hours as needed for cough.    Dispense:  120 mL    Refill:  0      Start time: 4:11pm End time: 4:17pm  Total time spent on patient care (  including video visit/ documentation): 11 minutes  Lynnett Langlinais Hulen Skains, DO [facility] [phone]

## 2020-01-01 ENCOUNTER — Other Ambulatory Visit: Payer: Self-pay

## 2020-01-01 ENCOUNTER — Ambulatory Visit (INDEPENDENT_AMBULATORY_CARE_PROVIDER_SITE_OTHER): Payer: 59 | Admitting: Family Medicine

## 2020-01-01 ENCOUNTER — Encounter: Payer: Self-pay | Admitting: Family Medicine

## 2020-01-01 VITALS — BP 121/81 | HR 90 | Temp 98.0°F | Ht 69.0 in | Wt 278.0 lb

## 2020-01-01 DIAGNOSIS — K219 Gastro-esophageal reflux disease without esophagitis: Secondary | ICD-10-CM | POA: Diagnosis not present

## 2020-01-01 DIAGNOSIS — I10 Essential (primary) hypertension: Secondary | ICD-10-CM

## 2020-01-01 DIAGNOSIS — F419 Anxiety disorder, unspecified: Secondary | ICD-10-CM

## 2020-01-01 MED ORDER — CETIRIZINE HCL 10 MG PO TABS: 10.0000 mg | ORAL_TABLET | Freq: Every day | ORAL | 11 refills | Status: DC

## 2020-01-01 MED ORDER — SERTRALINE HCL 100 MG PO TABS: 100.0000 mg | ORAL_TABLET | Freq: Every day | ORAL | 3 refills | Status: DC

## 2020-01-01 NOTE — Progress Notes (Signed)
BP 121/81   Pulse 90   Temp 98 F (36.7 C)   Ht 5\' 9"  (1.753 m)   Wt 278 lb (126.1 kg)   SpO2 95%   BMI 41.05 kg/m    Subjective:   Patient ID: , female    DOB: 1995-08-07, 24 y.o.   MRN: [id number]  HPI: Amy Nichols is a 24 y.o. female presenting on 01/01/2020 for Medical Management of Chronic Issues and Anxiety   HPI Patient is coming in today complaining of being more anxious especially during the day.  She says she has been taking the Zoloft at night but feels like it is just not working as well as it was, it still helps but not as well.  She denies any suicidal ideation or thoughts of hurting herself.  Hypertension Patient is currently on no medication currently we are monitoring and try diet control, and their blood pressure today is 121/81. Patient denies any lightheadedness or dizziness. Patient denies headaches, blurred vision, chest pains, shortness of breath, or weakness. Denies any side effects from medication and is content with current medication.   GERD Patient is currently on omeprazole.  She denies any major symptoms or abdominal pain or belching or burping. She denies any blood in her stool or lightheadedness or dizziness.   Patient also fights chronic allergies and takes Claritin for this and gets tonsil stones sometimes, she does have scheduled for an ENT to look in her tonsils little bit better.  She is taking the Claritin but has been taking it for some time, she still feels like it is helping although she has been having more more issues with the tonsil stones.  Relevant past medical, surgical, family and social history reviewed and updated as indicated. Interim medical history since our last visit reviewed. Allergies and medications reviewed and updated.  Review of Systems  Constitutional: Negative for chills and fever.  HENT: Positive for sore throat. Negative for congestion, ear discharge, ear pain, sneezing and tinnitus.   Eyes: Negative  for redness and visual disturbance.  Respiratory: Negative for cough, chest tightness, shortness of breath and wheezing.   Cardiovascular: Negative for chest pain and leg swelling.  Genitourinary: Negative for difficulty urinating and dysuria.  Musculoskeletal: Negative for back pain and gait problem.  Skin: Negative for rash.  Neurological: Negative for light-headedness and headaches.  Psychiatric/Behavioral: Negative for agitation and behavioral problems.  All other systems reviewed and are negative.   Per HPI unless specifically indicated above   Allergies as of 01/01/2020   No Known Allergies     Medication List       Accurate as of January 01, 2020 11:31 AM. If you have any questions, ask your nurse or doctor.        baclofen 10 MG tablet Commonly known as: LIORESAL Take 1 tablet (10 mg total) by mouth 3 (three) times daily.   dextromethorphan-guaiFENesin 30-600 MG 12hr tablet Commonly known as: MUCINEX DM Take 1 tablet by mouth 2 (two) times daily.   etodolac 300 MG capsule Commonly known as: LODINE Take 1 capsule (300 mg total) by mouth every 8 (eight) hours.   fluticasone 50 MCG/ACT nasal spray Commonly known as: FLONASE Place 1-2 sprays into both nostrils daily.   guaiFENesin-codeine 100-10 MG/5ML syrup Take 5 mLs by mouth every 6 (six) hours as needed for cough.   loratadine 10 MG tablet Commonly known as: CLARITIN Take 1 tablet (10 mg total) by mouth daily.   omeprazole 40 MG  capsule Commonly known as: PRILOSEC TAKE 1 CAPSULE BY MOUTH EVERY DAY   predniSONE 10 MG (21) Tbpk tablet Commonly known as: STERAPRED UNI-PAK 21 TAB As directed x 6 days   PROBIOTIC-10 PO Take by mouth daily.   sertraline 50 MG tablet Commonly known as: ZOLOFT Take 1 tablet (50 mg total) by mouth daily.        Objective:   BP 121/81   Pulse 90   Temp 98 F (36.7 C)   Ht 5\' 9"  (1.753 m)   Wt 278 lb (126.1 kg)   SpO2 95%   BMI 41.05 kg/m   Wt Readings from  Last 3 Encounters:  01/01/20 278 lb (126.1 kg)  09/29/19 281 lb 4 oz (127.6 kg)  09/05/19 292 lb (132.5 kg)    Physical Exam Vitals and nursing note reviewed.  Constitutional:      General: She is not in acute distress.    Appearance: She is well-developed. She is not diaphoretic.  Eyes:     Conjunctiva/sclera: Conjunctivae normal.  Cardiovascular:     Rate and Rhythm: Normal rate and regular rhythm.     Heart sounds: Normal heart sounds. No murmur heard.   Pulmonary:     Effort: Pulmonary effort is normal. No respiratory distress.     Breath sounds: Normal breath sounds. No wheezing.  Musculoskeletal:        General: No tenderness. Normal range of motion.  Skin:    General: Skin is warm and dry.     Findings: No rash.  Neurological:     Mental Status: She is alert and oriented to person, place, and time.     Coordination: Coordination normal.  Psychiatric:        Behavior: Behavior normal.       Assessment & Plan:   Problem List Items Addressed This Visit      Cardiovascular and Mediastinum   Essential hypertension - Primary     Digestive   GERD without esophagitis    Other Visit Diagnoses    Anxiety       Relevant Medications   sertraline (ZOLOFT) 100 MG tablet      Increase sertraline to 100 mg daily, also change from Claritin to Zyrtec to see if it helps with the tonsillar stones.  She has an appointment with ENT later this month. Follow up plan: Return in about 2 months (around 03/02/2020), or if symptoms worsen or fail to improve, for Anxiety recheck.  Counseling provided for all of the vaccine components No orders of the defined types were placed in this encounter.   03/04/2020, MD [facility] 01/01/2020, 11:31 AM

## 2020-01-17 ENCOUNTER — Other Ambulatory Visit: Payer: Self-pay | Admitting: Otolaryngology

## 2020-01-17 ENCOUNTER — Encounter (HOSPITAL_BASED_OUTPATIENT_CLINIC_OR_DEPARTMENT_OTHER): Payer: Self-pay | Admitting: Otolaryngology

## 2020-01-17 ENCOUNTER — Other Ambulatory Visit: Payer: Self-pay

## 2020-01-19 ENCOUNTER — Other Ambulatory Visit (HOSPITAL_COMMUNITY)
Admission: RE | Admit: 2020-01-19 | Discharge: 2020-01-19 | Disposition: A | Discharge status: Home / Self Care | Payer: 59 | Source: Ambulatory Visit | Attending: Otolaryngology | Admitting: Otolaryngology

## 2020-01-19 ENCOUNTER — Other Ambulatory Visit (HOSPITAL_COMMUNITY): Payer: 59

## 2020-01-19 DIAGNOSIS — Z01812 Encounter for preprocedural laboratory examination: Secondary | ICD-10-CM | POA: Diagnosis present

## 2020-01-19 DIAGNOSIS — Z20822 Contact with and (suspected) exposure to covid-19: Secondary | ICD-10-CM | POA: Insufficient documentation

## 2020-01-19 LAB — SARS CORONAVIRUS 2 (TAT 6-24 HRS): SARS Coronavirus 2: NEGATIVE

## 2020-01-23 ENCOUNTER — Encounter (HOSPITAL_BASED_OUTPATIENT_CLINIC_OR_DEPARTMENT_OTHER): Payer: Self-pay | Admitting: Otolaryngology

## 2020-01-23 ENCOUNTER — Ambulatory Visit (HOSPITAL_BASED_OUTPATIENT_CLINIC_OR_DEPARTMENT_OTHER)
Admission: RE | Admit: 2020-01-23 | Discharge: 2020-01-23 | Disposition: A | Discharge status: Home / Self Care | Payer: 59 | Attending: Otolaryngology | Admitting: Otolaryngology

## 2020-01-23 ENCOUNTER — Encounter (HOSPITAL_BASED_OUTPATIENT_CLINIC_OR_DEPARTMENT_OTHER)
Admission: RE | Disposition: A | Discharge status: Home / Self Care | Payer: Self-pay | Source: Home / Self Care | Attending: Otolaryngology

## 2020-01-23 ENCOUNTER — Ambulatory Visit (HOSPITAL_BASED_OUTPATIENT_CLINIC_OR_DEPARTMENT_OTHER): Payer: 59 | Admitting: Anesthesiology

## 2020-01-23 ENCOUNTER — Other Ambulatory Visit: Payer: Self-pay

## 2020-01-23 DIAGNOSIS — J312 Chronic pharyngitis: Secondary | ICD-10-CM | POA: Diagnosis not present

## 2020-01-23 DIAGNOSIS — Z6841 Body Mass Index (BMI) 40.0 and over, adult: Secondary | ICD-10-CM | POA: Insufficient documentation

## 2020-01-23 DIAGNOSIS — I10 Essential (primary) hypertension: Secondary | ICD-10-CM | POA: Diagnosis not present

## 2020-01-23 DIAGNOSIS — J3501 Chronic tonsillitis: Secondary | ICD-10-CM | POA: Diagnosis present

## 2020-01-23 DIAGNOSIS — J353 Hypertrophy of tonsils with hypertrophy of adenoids: Secondary | ICD-10-CM | POA: Insufficient documentation

## 2020-01-23 HISTORY — PX: TONSILLECTOMY AND ADENOIDECTOMY: SHX28

## 2020-01-23 HISTORY — DX: Allergy, unspecified, initial encounter: T78.40XA

## 2020-01-23 LAB — POCT PREGNANCY, URINE: Preg Test, Ur: NEGATIVE

## 2020-01-23 SURGERY — TONSILLECTOMY AND ADENOIDECTOMY
Anesthesia: General | Site: Throat | Laterality: Bilateral

## 2020-01-23 MED ORDER — OXYCODONE HCL 5 MG/5ML PO SOLN
ORAL | Status: AC
  Filled 2020-01-23: qty 5

## 2020-01-23 MED ORDER — LIDOCAINE 2% (20 MG/ML) 5 ML SYRINGE
INTRAMUSCULAR | Status: DC | PRN
  Administered 2020-01-23: 60 mg via INTRAVENOUS

## 2020-01-23 MED ORDER — SODIUM CHLORIDE 0.9 % IR SOLN
Status: DC | PRN
  Administered 2020-01-23: 300 mL

## 2020-01-23 MED ORDER — FENTANYL CITRATE (PF) 100 MCG/2ML IJ SOLN
INTRAMUSCULAR | Status: AC
  Filled 2020-01-23: qty 2

## 2020-01-23 MED ORDER — LACTATED RINGERS IV SOLN: INTRAVENOUS | Status: DC

## 2020-01-23 MED ORDER — OXYCODONE HCL 5 MG PO TABS: 5.0000 mg | ORAL_TABLET | Freq: Once | ORAL | Status: AC | PRN

## 2020-01-23 MED ORDER — ONDANSETRON HCL 4 MG/2ML IJ SOLN
INTRAMUSCULAR | Status: DC | PRN
  Administered 2020-01-23: 4 mg via INTRAVENOUS

## 2020-01-23 MED ORDER — OXYMETAZOLINE HCL 0.05 % NA SOLN
NASAL | Status: DC | PRN
  Administered 2020-01-23: 1 via TOPICAL

## 2020-01-23 MED ORDER — MIDAZOLAM HCL 5 MG/5ML IJ SOLN
INTRAMUSCULAR | Status: DC | PRN
  Administered 2020-01-23: 2 mg via INTRAVENOUS

## 2020-01-23 MED ORDER — DEXAMETHASONE SODIUM PHOSPHATE 10 MG/ML IJ SOLN
INTRAMUSCULAR | Status: AC
  Filled 2020-01-23: qty 1

## 2020-01-23 MED ORDER — ROCURONIUM BROMIDE 10 MG/ML (PF) SYRINGE
PREFILLED_SYRINGE | INTRAVENOUS | Status: AC
  Filled 2020-01-23: qty 10

## 2020-01-23 MED ORDER — DEXAMETHASONE SODIUM PHOSPHATE 10 MG/ML IJ SOLN
INTRAMUSCULAR | Status: DC | PRN
  Administered 2020-01-23: 10 mg via INTRAVENOUS

## 2020-01-23 MED ORDER — MIDAZOLAM HCL 2 MG/2ML IJ SOLN
INTRAMUSCULAR | Status: AC
  Filled 2020-01-23: qty 2

## 2020-01-23 MED ORDER — SUCCINYLCHOLINE CHLORIDE 20 MG/ML IJ SOLN
INTRAMUSCULAR | Status: DC | PRN
  Administered 2020-01-23: 120 mg via INTRAVENOUS

## 2020-01-23 MED ORDER — OXYCODONE-ACETAMINOPHEN 5-325 MG PO TABS: 1.0000 | ORAL_TABLET | ORAL | 0 refills | Status: AC | PRN

## 2020-01-23 MED ORDER — OXYCODONE HCL 5 MG/5ML PO SOLN
5.0000 mg | Freq: Once | ORAL | Status: AC | PRN
  Administered 2020-01-23: 5 mg via ORAL

## 2020-01-23 MED ORDER — FENTANYL CITRATE (PF) 100 MCG/2ML IJ SOLN: 25.0000 ug | INTRAMUSCULAR | Status: DC | PRN

## 2020-01-23 MED ORDER — PROPOFOL 10 MG/ML IV BOLUS
INTRAVENOUS | Status: AC
  Filled 2020-01-23: qty 20

## 2020-01-23 MED ORDER — PROPOFOL 10 MG/ML IV BOLUS
INTRAVENOUS | Status: DC | PRN
  Administered 2020-01-23: 200 mg via INTRAVENOUS
  Administered 2020-01-23: 100 mg via INTRAVENOUS

## 2020-01-23 MED ORDER — ONDANSETRON HCL 4 MG/2ML IJ SOLN
INTRAMUSCULAR | Status: AC
  Filled 2020-01-23: qty 2

## 2020-01-23 MED ORDER — FENTANYL CITRATE (PF) 100 MCG/2ML IJ SOLN
INTRAMUSCULAR | Status: DC | PRN
Start: 2020-01-23 — End: 2020-01-23
  Administered 2020-01-23: 100 ug via INTRAVENOUS

## 2020-01-23 MED ORDER — LIDOCAINE 2% (20 MG/ML) 5 ML SYRINGE
INTRAMUSCULAR | Status: AC
  Filled 2020-01-23: qty 5

## 2020-01-23 MED ORDER — SUCCINYLCHOLINE CHLORIDE 200 MG/10ML IV SOSY
PREFILLED_SYRINGE | INTRAVENOUS | Status: AC
  Filled 2020-01-23: qty 10

## 2020-01-23 MED ORDER — AMOXICILLIN 400 MG/5ML PO SUSR: 800.0000 mg | Freq: Two times a day (BID) | ORAL | 0 refills | Status: AC

## 2020-01-23 MED ORDER — PROMETHAZINE HCL 25 MG/ML IJ SOLN: 6.2500 mg | INTRAMUSCULAR | Status: DC | PRN

## 2020-01-23 SURGICAL SUPPLY — 32 items
BNDG COHESIVE 2X5 TAN STRL LF (GAUZE/BANDAGES/DRESSINGS) IMPLANT
CANISTER SUCT 1200ML W/VALVE (MISCELLANEOUS) ×3 IMPLANT
CATH ROBINSON RED A/P 10FR (CATHETERS) IMPLANT
CATH ROBINSON RED A/P 14FR (CATHETERS) ×3 IMPLANT
COAGULATOR SUCT 6 FR SWTCH (ELECTROSURGICAL)
COAGULATOR SUCT SWTCH 10FR 6 (ELECTROSURGICAL) IMPLANT
COVER BACK TABLE 60X90IN (DRAPES) ×3 IMPLANT
COVER MAYO STAND STRL (DRAPES) ×3 IMPLANT
COVER WAND RF STERILE (DRAPES) IMPLANT
ELECT REM PT RETURN 9FT ADLT (ELECTROSURGICAL) ×3
ELECT REM PT RETURN 9FT PED (ELECTROSURGICAL)
ELECTRODE REM PT RETRN 9FT PED (ELECTROSURGICAL) IMPLANT
ELECTRODE REM PT RTRN 9FT ADLT (ELECTROSURGICAL) ×1 IMPLANT
GAUZE SPONGE 4X4 12PLY STRL LF (GAUZE/BANDAGES/DRESSINGS) ×3 IMPLANT
GLOVE BIO SURGEON STRL SZ7.5 (GLOVE) ×3 IMPLANT
GLOVE BIOGEL M 6.5 STRL (GLOVE) ×3 IMPLANT
GOWN STRL REUS W/ TWL LRG LVL3 (GOWN DISPOSABLE) ×2 IMPLANT
GOWN STRL REUS W/TWL LRG LVL3 (GOWN DISPOSABLE) ×6
IV NS 500ML (IV SOLUTION) ×3
IV NS 500ML BAXH (IV SOLUTION) ×1 IMPLANT
MARKER SKIN DUAL TIP RULER LAB (MISCELLANEOUS) IMPLANT
NS IRRIG 1000ML POUR BTL (IV SOLUTION) ×3 IMPLANT
SHEET MEDIUM DRAPE 40X70 STRL (DRAPES) ×3 IMPLANT
SOLUTION BUTLER CLEAR DIP (MISCELLANEOUS) ×3 IMPLANT
SPONGE TONSIL TAPE 1.25 RFD (DISPOSABLE) ×3 IMPLANT
SYR BULB EAR ULCER 3OZ GRN STR (SYRINGE) ×3 IMPLANT
TOWEL GREEN STERILE FF (TOWEL DISPOSABLE) ×3 IMPLANT
TUBE CONNECTING 20'X1/4 (TUBING) ×1
TUBE CONNECTING 20X1/4 (TUBING) ×2 IMPLANT
TUBE SALEM SUMP 12R W/ARV (TUBING) IMPLANT
TUBE SALEM SUMP 16 FR W/ARV (TUBING) ×3 IMPLANT
WAND COBLATOR 70 EVAC XTRA (SURGICAL WAND) ×3 IMPLANT

## 2020-01-23 NOTE — H&P (Signed)
Cc: Chronic sore throat, tonsil stones  HPI: The patient is a 24 y/o female who presents today for evaluation of recurrent sore throat and frequent tonsil stones. The patient is seen in consultation requested by [facility]. The patient has noted issues with her tonsils for most of her life. She had frequent strep throat as a child. She currently complains of persistently enlarged tonsils with frequent sore throat and tonsil stones. The patient was treated with a course of antibiotics recently with persistent symptoms noted. The patient is noted to snore loudly as well. She is otherwise healthy.   The patient's review of systems (constitutional, eyes, ENT, cardiovascular, respiratory, GI, musculoskeletal, skin, neurologic, psychiatric, endocrine, hematologic, allergic) is noted in the ROS questionnaire.  It is reviewed with the patient.   Family health history: Diabetes.  Major events: None.  Ongoing medical problems: Hypertension, reflux, headache.  Social history: The patient is single. She denies the use of tobacco, alcohol or illegal drugs.  Exam: General: Communicates without difficulty, well nourished, no acute distress. Head:  Normocephalic, no lesions or asymmetry. Eyes: PERRL, EOMI. No scleral icterus, conjunctivae clear.  Neuro: CN II exam reveals vision grossly intact.  No nystagmus at any point of gaze. Ears:  EAC normal without erythema AU.  TM intact without fluid and mobile AU. Nose: Moist, pink mucosa without lesions or mass. Mouth: Oral cavity clear and moist, no lesions, tonsils symmetric. Tonsils are 3+, cryptic. Tonsils with mild erythema. Neck: Full range of motion, no lymphadenopathy or masses.   Assessment 1.  The patient's history and physical exam findings are consistent with chronic tonsillitis/pharyngitis and tonsilloliths, secondary to adenotonsillar hypertrophy.  Plan 1. The treatment options include continuing conservative observation versus  adenotonsillectomy.  Based on the patient's history and physical exam findings, the patient will likely benefit from having the tonsils and adenoid removed.  The risks, benefits, alternatives, and details of the procedure are reviewed with the patient.  Questions are invited and answered.  2. The patient is interested in proceeding with the procedure.  We will schedule the procedure in accordance with the family schedule.

## 2020-01-23 NOTE — Discharge Instructions (Signed)
°Post Anesthesia Home Care Instructions ° °Activity: °Get plenty of rest for the remainder of the day. A responsible individual must stay with you for 24 hours following the procedure.  °For the next 24 hours, DO NOT: °-Drive a car °-Operate machinery °-Drink alcoholic beverages °-Take any medication unless instructed by your physician °-Make any legal decisions or sign important papers. ° °Meals: °Start with liquid foods such as gelatin or soup. Progress to regular foods as tolerated. Avoid greasy, spicy, heavy foods. If nausea and/or vomiting occur, drink only clear liquids until the nausea and/or vomiting subsides. Call your physician if vomiting continues. ° °Special Instructions/Symptoms: °Your throat may feel dry or sore from the anesthesia or the breathing tube placed in your throat during surgery. If this causes discomfort, gargle with warm salt water. The discomfort should disappear within 24 hours. ° °If you had a scopolamine patch placed behind your ear for the management of post- operative nausea and/or vomiting: ° °1. The medication in the patch is effective for 72 hours, after which it should be removed.  Wrap patch in a tissue and discard in the trash. Wash hands thoroughly with soap and water. °2. You may remove the patch earlier than 72 hours if you experience unpleasant side effects which may include dry mouth, dizziness or visual disturbances. °3. Avoid touching the patch. Wash your hands with soap and water after contact with the patch. °  °[facility]us[facility] M.D[facility] °Postoperative Instructions for Tonsillectomy & Adenoidectomy (T&A) °Activity °Restrict activity at home for the first two days, resting as much as possible. Light indoor activity is best. You may usually return to school or work within a week but void strenuous activity and sports for two weeks. Sleep with your head elevated on 2-3 pillows for 3-4 days to help decrease swelling. °Diet °Due to tissue swelling and throat discomfort, you  may have little desire to drink for several days. However fluids are very important to prevent dehydration. You will find that non-acidic juices, soups, popsicles, Jell-O, custard, puddings, and any soft or mashed foods taken in small quantities can be swallowed fairly easily. Try to increase your fluid and food intake as the discomfort subsides. It is recommended that a child receive 1-1/2 quarts of fluid in a 24-hour period. Adult require twice this amount.  °Discomfort °Your sore throat may be relieved by applying an ice collar to your neck and/or by taking Tylenol®. You may experience an earache, which is due to referred pain from the throat. Referred ear pain is commonly felt at night when trying to rest. ° °Bleeding                        Although rare, there is risk of having some bleeding during the first 2 weeks after having a T&A. This usually happens between days 7-10 postoperatively. If you or your child should have any bleeding, try to remain calm. We recommend sitting up quietly in a chair and gently spitting out the blood into a bowl. For adults, gargling gently with ice water may help. If the bleeding does not stop after a short time (5 minutes), is more than 1 teaspoonful, or if you become worried, please call our office at [phone] or go directly to the nearest hospital emergency room. Do not eat or drink anything prior to going to the hospital as you may need to be taken to the operating room in order to control the bleeding. °GENERAL CONSIDERATIONS °  1. Brush your teeth regularly. Avoid mouthwashes and gargles for three weeks. You may gargle gently with warm salt-water as necessary or spray with Chloraseptic®. You may make salt-water by placing 2 teaspoons of table salt into a quart of fresh water. Warm the salt-water in a microwave to a luke warm temperature.  °2. Avoid exposure to colds and upper respiratory infections if possible.  °3. If you look into a mirror or into your child's mouth,  you will see white-gray patches in the back of the throat. This is normal after having a T&A and is like a scab that forms on the skin after an abrasion. It will disappear once the back of the throat heals completely. However, it may cause a noticeable odor; this too will disappear with time. Again, warm salt-water gargles may be used to help keep the throat clean and promote healing.  °4. You may notice a temporary change in voice quality, such as a higher pitched voice or a nasal sound, until healing is complete. This may last for 1-2 weeks and should resolve.  °5. Do not take or give you child any medications that we have not prescribed or recommended.  °6. Snoring may occur, especially at night, for the first week after a T&A. It is due to swelling of the soft palate and will usually resolve.  °Please call our office at [phone] if you have any questions.   °

## 2020-01-23 NOTE — Anesthesia Procedure Notes (Signed)
Procedure Name: Intubation Date/Time: 01/23/2020 8:14 AM Performed by: Lavonia Dana, CRNA Pre-anesthesia Checklist: Patient identified, Emergency Drugs available, Suction available and Patient being monitored Patient Re-evaluated:Patient Re-evaluated prior to induction Oxygen Delivery Method: Circle system utilized Preoxygenation: Pre-oxygenation with 100% oxygen Induction Type: IV induction Ventilation: Mask ventilation without difficulty Laryngoscope Size: Mac and 4 Grade View: Grade I Tube type: Oral Tube size: 7.0 mm Number of attempts: 1 Airway Equipment and Method: Stylet and Bite block Placement Confirmation: ETT inserted through vocal cords under direct vision,  positive ETCO2 and breath sounds checked- equal and bilateral Secured at: 23 cm Tube secured with: Tape Dental Injury: Teeth and Oropharynx as per pre-operative assessment

## 2020-01-23 NOTE — Op Note (Signed)
DATE OF PROCEDURE:  01/23/2020                              OPERATIVE REPORT  SURGEON:  Newman Pies, MD  PREOPERATIVE DIAGNOSES: 1. Adenotonsillar hypertrophy. 2. Chronic tonsillitis and pharyngitis  POSTOPERATIVE DIAGNOSES: 1. Adenotonsillar hypertrophy. 2. Chronic tonsillitis and pharyngitis  PROCEDURE PERFORMED:  Adenotonsillectomy.  ANESTHESIA:  General endotracheal tube anesthesia.  COMPLICATIONS:  None.  ESTIMATED BLOOD LOSS:  Minimal.  INDICATION FOR PROCEDURE:  Amy Nichols is a 24 y.o. female with a history of chronic tonsillitis/pharyngitis and halitosis.  According to the patient, she has been experiencing chronic throat discomfort with halitosis for several years. The patient continued to be symptomatic despite medical treatments. On examination, the patient was noted to have bilateral cryptic tonsils, with numerous tonsilloliths. Based on the above findings, the decision was made for the patient to undergo the adenotonsillectomy procedure. Likelihood of success in reducing symptoms was also discussed.  The risks, benefits, alternatives, and details of the procedure were discussed with the patient.  Questions were invited and answered.  Informed consent was obtained.  DESCRIPTION:  The patient was taken to the operating room and placed supine on the operating table.  General endotracheal tube anesthesia was administered by the anesthesiologist.  The patient was positioned and prepped and draped in a standard fashion for adenotonsillectomy.  A Crowe-Davis mouth gag was inserted into the oral cavity for exposure. 3+ cryptic tonsils were noted bilaterally.  No bifidity was noted.  Indirect mirror examination of the nasopharynx revealed moderate adenoid hypertrophy. The adenoid was ablated with the Coblator device. Hemostasis was achieved with the Coblator device.  The right tonsil was then grasped with a straight Allis clamp and retracted medially.  It was resected free from the  underlying pharyngeal constrictor muscles with the Coblator device.  The same procedure was repeated on the left side without exception.  The surgical sites were copiously irrigated.  The mouth gag was removed.  The care of the patient was turned over to the anesthesiologist.  The patient was awakened from anesthesia without difficulty.  The patient was extubated and transferred to the recovery room in good condition.  OPERATIVE FINDINGS:  Adenotonsillar hypertrophy.  SPECIMEN: None  FOLLOWUP CARE:  The patient will be discharged home once awake and alert.  She will be placed on amoxicillin 800 mg p.o. b.i.d. for 5 days, and percocet for postop pain control.   The patient will follow up in my office in approximately 2 weeks.  Faithanne Verret W Zaylon Bossier 01/23/2020 8:52 AM

## 2020-01-23 NOTE — Anesthesia Preprocedure Evaluation (Addendum)
Anesthesia Evaluation  Patient identified by MRN, date of birth, ID band Patient awake    Reviewed: Allergy & Precautions, NPO status , Patient's Chart, lab work & pertinent test results  History of Anesthesia Complications Negative for: history of anesthetic complications  Airway Mallampati: II  TM Distance: >3 FB Neck ROM: Full    Dental  (+) Dental Advisory Given   Pulmonary neg pulmonary ROS,    Pulmonary exam normal        Cardiovascular hypertension (no meds), Normal cardiovascular exam     Neuro/Psych negative neurological ROS  negative psych ROS   GI/Hepatic Neg liver ROS, GERD  Medicated and Controlled,  Endo/Other  Morbid obesity  Renal/GU negative Renal ROS     Musculoskeletal negative musculoskeletal ROS (+)   Abdominal (+) + obese,   Peds  Hematology negative hematology ROS (+)   Anesthesia Other Findings Covid test negative   Reproductive/Obstetrics                            Anesthesia Physical Anesthesia Plan  ASA: III  Anesthesia Plan: General   Post-op Pain Management:    Induction: Intravenous  PONV Risk Score and Plan: 4 or greater and Treatment may vary due to age or medical condition, Ondansetron, Midazolam and Dexamethasone  Airway Management Planned: Oral ETT  Additional Equipment: None  Intra-op Plan:   Post-operative Plan: Extubation in OR  Informed Consent: I have reviewed the patients History and Physical, chart, labs and discussed the procedure including the risks, benefits and alternatives for the proposed anesthesia with the patient or authorized representative who has indicated his/her understanding and acceptance.     Dental advisory given  Plan Discussed with: CRNA and Anesthesiologist  Anesthesia Plan Comments:        Anesthesia Quick Evaluation

## 2020-01-23 NOTE — Transfer of Care (Signed)
Immediate Anesthesia Transfer of Care Note  Patient: Amy Nichols  Procedure(s) Performed: TONSILLECTOMY AND ADENOIDECTOMY (Bilateral Throat)  Patient Location: PACU  Anesthesia Type:General  Level of Consciousness: drowsy  Airway & Oxygen Therapy: Patient Spontanous Breathing and Patient connected to face mask oxygen  Post-op Assessment: Report given to RN and Post -op Vital signs reviewed and stable  Post vital signs: Reviewed and stable  Last Vitals:  Vitals Value Taken Time  BP 153/94 01/23/20 0906  Temp    Pulse 104 01/23/20 0908  Resp 20 01/23/20 0908  SpO2 99 % 01/23/20 0908  Vitals shown include unvalidated device data.  Last Pain:  Vitals:   01/23/20 0755  TempSrc: Oral  PainSc: 0-No pain         Complications: No complications documented.

## 2020-01-24 NOTE — Anesthesia Postprocedure Evaluation (Signed)
Anesthesia Post Note  Patient: KEIMORA SWARTOUT  Procedure(s) Performed: TONSILLECTOMY AND ADENOIDECTOMY (Bilateral Throat)     Patient location during evaluation: PACU Anesthesia Type: General Level of consciousness: awake and alert Pain management: pain level controlled Vital Signs Assessment: post-procedure vital signs reviewed and stable Respiratory status: spontaneous breathing, nonlabored ventilation and respiratory function stable Cardiovascular status: blood pressure returned to baseline and stable Postop Assessment: no apparent nausea or vomiting Anesthetic complications: no   No complications documented.  Last Vitals:  Vitals:   01/23/20 0945 01/23/20 1015  BP: 131/88 (!) 153/98  Pulse: 96 99  Resp: 19 16  Temp:  36.9 C  SpO2: 97% 97%    Last Pain:  Vitals:   01/24/20 1224  TempSrc:   PainSc: 7                  Beryle Lathe

## 2020-01-29 ENCOUNTER — Encounter (HOSPITAL_BASED_OUTPATIENT_CLINIC_OR_DEPARTMENT_OTHER): Payer: Self-pay | Admitting: Otolaryngology

## 2020-02-15 ENCOUNTER — Encounter: Payer: Self-pay | Admitting: Family Medicine

## 2020-02-15 DIAGNOSIS — F419 Anxiety disorder, unspecified: Secondary | ICD-10-CM

## 2020-03-04 ENCOUNTER — Other Ambulatory Visit: Payer: Self-pay

## 2020-03-04 ENCOUNTER — Encounter: Payer: Self-pay | Admitting: Family Medicine

## 2020-03-04 ENCOUNTER — Ambulatory Visit (INDEPENDENT_AMBULATORY_CARE_PROVIDER_SITE_OTHER): Payer: 59 | Admitting: Family Medicine

## 2020-03-04 VITALS — BP 135/87 | HR 104 | Temp 98.0°F | Ht 69.0 in | Wt 272.0 lb

## 2020-03-04 DIAGNOSIS — Z23 Encounter for immunization: Secondary | ICD-10-CM

## 2020-03-04 DIAGNOSIS — F419 Anxiety disorder, unspecified: Secondary | ICD-10-CM | POA: Diagnosis not present

## 2020-03-04 NOTE — Progress Notes (Signed)
BP 135/87   Pulse (!) 104   Temp 98 F (36.7 C)   Ht 5\' 9"  (1.753 m)   Wt 272 lb (123.4 kg)   BMI 40.17 kg/m    Subjective:   Patient ID: , female    DOB: November 16, 1995, 24 y.o.   MRN: [id number]  HPI: Amy Nichols is a 24 y.o. female presenting on 03/04/2020 for Medical Management of Chronic Issues and Anxiety   HPI Patient is coming in for anxiety recheck.  She never did get into see a counselor but she feels like her anxiety is doing a lot better.  She is taking the Zoloft in the evening although she is not sleeping as well and having more difficulty falling asleep.  She does not know if this is from the medicine or what is causing it.  She says her mind is not racing her anxieties, but she still just not sleeping as well.  She does complain of a little bit of dry mouth with increasing medicine but it is not bad not that she wants to change anything because her anxiety is doing so well.  She currently takes Zoloft 100 mg.  Patient still find some constipation, she did want milk of magnesia it did help but did not clear up completely, recommended for her to do a more of a regimen to clear it out.  Relevant past medical, surgical, family and social history reviewed and updated as indicated. Interim medical history since our last visit reviewed. Allergies and medications reviewed and updated.  Review of Systems  Constitutional: Negative for chills and fever.  Eyes: Negative for visual disturbance.  Respiratory: Negative for chest tightness and shortness of breath.   Cardiovascular: Negative for chest pain and leg swelling.  Musculoskeletal: Negative for back pain and gait problem.  Skin: Negative for rash.  Neurological: Negative for light-headedness and headaches.  Psychiatric/Behavioral: Positive for sleep disturbance. Negative for agitation, behavioral problems, decreased concentration, dysphoric mood, self-injury and suicidal ideas. The patient is  nervous/anxious.   All other systems reviewed and are negative.   Per HPI unless specifically indicated above   Allergies as of 03/04/2020   No Known Allergies     Medication List       Accurate as of March 04, 2020 10:57 AM. If you have any questions, ask your nurse or doctor.        STOP taking these medications   etodolac 300 MG capsule Commonly known as: LODINE Stopped by: March 06, 2020, MD   loratadine 10 MG tablet Commonly known as: CLARITIN Stopped by: Nils Pyle Meka Lewan, MD     TAKE these medications   cetirizine 10 MG tablet Commonly known as: ZYRTEC Take 1 tablet (10 mg total) by mouth daily.   fluticasone 50 MCG/ACT nasal spray Commonly known as: FLONASE Place 1-2 sprays into both nostrils daily.   omeprazole 40 MG capsule Commonly known as: PRILOSEC TAKE 1 CAPSULE BY MOUTH EVERY DAY   PROBIOTIC-10 PO Take by mouth daily.   sertraline 100 MG tablet Commonly known as: ZOLOFT Take 1 tablet (100 mg total) by mouth daily.        Objective:   BP 135/87   Pulse (!) 104   Temp 98 F (36.7 C)   Ht 5\' 9"  (1.753 m)   Wt 272 lb (123.4 kg)   BMI 40.17 kg/m   Wt Readings from Last 3 Encounters:  03/04/20 272 lb (123.4 kg)  01/23/20 279 lb 8.7  oz (126.8 kg)  01/01/20 278 lb (126.1 kg)    Physical Exam Vitals and nursing note reviewed.  Constitutional:      General: She is not in acute distress.    Appearance: She is well-developed. She is not diaphoretic.  Eyes:     Conjunctiva/sclera: Conjunctivae normal.  Cardiovascular:     Rate and Rhythm: Normal rate and regular rhythm.     Heart sounds: Normal heart sounds. No murmur heard.   Pulmonary:     Effort: Pulmonary effort is normal. No respiratory distress.     Breath sounds: Normal breath sounds. No wheezing.  Musculoskeletal:        General: No tenderness. Normal range of motion.  Skin:    General: Skin is warm and dry.     Findings: No rash.  Neurological:     Mental Status:  She is alert and oriented to person, place, and time.     Coordination: Coordination normal.  Psychiatric:        Mood and Affect: Mood is anxious. Mood is not depressed.        Behavior: Behavior normal.        Thought Content: Thought content does not include suicidal ideation. Thought content does not include suicidal plan.       Assessment & Plan:   Problem List Items Addressed This Visit    None    Visit Diagnoses    Anxiety    -  Primary   Need for immunization against influenza       Relevant Orders   Flu Vaccine QUAD 36+ mos IM (Completed)      Continue current medication. Follow up plan: Return in about 3 months (around 06/04/2020), or if symptoms worsen or fail to improve, for Follow-up anxiety.  Counseling provided for all of the vaccine components Orders Placed This Encounter  Procedures  . Flu Vaccine QUAD 36+ mos IM    Arville Care, MD [facility] 03/04/2020, 10:57 AM

## 2020-03-12 ENCOUNTER — Other Ambulatory Visit: Payer: Self-pay | Admitting: Family Medicine

## 2020-03-20 ENCOUNTER — Encounter: Payer: Self-pay | Admitting: Family Medicine

## 2020-03-20 MED ORDER — NORGESTIMATE-ETH ESTRADIOL 0.25-35 MG-MCG PO TABS: 1.0000 | ORAL_TABLET | Freq: Every day | ORAL | 0 refills | Status: DC

## 2020-03-21 ENCOUNTER — Other Ambulatory Visit: Payer: Self-pay | Admitting: Family Medicine

## 2020-04-11 ENCOUNTER — Telehealth: Payer: 59 | Admitting: Family

## 2020-04-11 ENCOUNTER — Other Ambulatory Visit: Payer: Self-pay | Admitting: Family

## 2020-04-11 DIAGNOSIS — J019 Acute sinusitis, unspecified: Secondary | ICD-10-CM

## 2020-04-11 MED ORDER — AMOXICILLIN-POT CLAVULANATE 875-125 MG PO TABS: 1.0000 | ORAL_TABLET | Freq: Two times a day (BID) | ORAL | 0 refills | Status: AC

## 2020-04-11 NOTE — Progress Notes (Signed)
We are sorry that you are not feeling well.  Here is how we plan to help!  Based on what you have shared with me it looks like you have sinusitis.  Sinusitis is inflammation and infection in the sinus cavities of the head.  Based on your presentation I believe you most likely have Acute Bacterial Sinusitis.  This is an infection caused by bacteria and is treated with antibiotics. I have prescribed Augmentin 875mg/125mg one tablet twice daily with food, for 10 days. You may use an oral decongestant such as Mucinex D or if you have glaucoma or high blood pressure use plain Mucinex. Saline nasal spray help and can safely be used as often as needed for congestion.  If you develop worsening sinus pain, fever or notice severe headache and vision changes, or if symptoms are not better after completion of antibiotic, please schedule an appointment with a health care provider.    Sinus infections are not as easily transmitted as other respiratory infection, however we still recommend that you avoid close contact with loved ones, especially the very young and elderly.  Remember to wash your hands thoroughly throughout the day as this is the number one way to prevent the spread of infection!  Home Care:  Only take medications as instructed by your medical team.  Complete the entire course of an antibiotic.  Do not take these medications with alcohol.  A steam or ultrasonic humidifier can help congestion.  You can place a towel over your head and breathe in the steam from hot water coming from a faucet.  Avoid close contacts especially the very young and the elderly.  Cover your mouth when you cough or sneeze.  Always remember to wash your hands.  Get Help Right Away If:  You develop worsening fever or sinus pain.  You develop a severe head ache or visual changes.  Your symptoms persist after you have completed your treatment plan.  Make sure you  Understand these instructions.  Will watch your  condition.  Will get help right away if you are not doing well or get worse.  Your e-visit answers were reviewed by a board certified advanced clinical practitioner to complete your personal care plan.  Depending on the condition, your plan could have included both over the counter or prescription medications.  If there is a problem please reply  once you have received a response from your provider.  Your safety is important to us.  If you have drug allergies check your prescription carefully.    You can use MyChart to ask questions about today's visit, request a non-urgent call back, or ask for a work or school excuse for 24 hours related to this e-Visit. If it has been greater than 24 hours you will need to follow up with your provider, or enter a new e-Visit to address those concerns.  You will get an e-mail in the next two days asking about your experience.  I hope that your e-visit has been valuable and will speed your recovery. Thank you for using e-visits.  Greater than 5 minutes, yet less than 10 minutes of time have been spent researching, coordinating, and implementing care for this patient.     

## 2020-04-16 ENCOUNTER — Other Ambulatory Visit: Payer: Self-pay | Admitting: Family Medicine

## 2020-04-18 ENCOUNTER — Telehealth: Payer: 59 | Admitting: Physician Assistant

## 2020-04-18 DIAGNOSIS — J069 Acute upper respiratory infection, unspecified: Secondary | ICD-10-CM | POA: Diagnosis not present

## 2020-04-18 DIAGNOSIS — R059 Cough, unspecified: Secondary | ICD-10-CM

## 2020-04-18 MED ORDER — BENZONATATE 100 MG PO CAPS: 100.0000 mg | ORAL_CAPSULE | Freq: Three times a day (TID) | ORAL | 0 refills | Status: DC | PRN

## 2020-04-18 MED ORDER — PREDNISONE 5 MG PO TABS: ORAL_TABLET | ORAL | 0 refills | Status: DC

## 2020-04-18 NOTE — Progress Notes (Signed)
We are sorry that you are not feeling well.  Here is how we plan to help!  Based on your presentation I believe you most likely have A cough due to a virus.  This is called viral bronchitis and is best treated by rest, plenty of fluids and control of the cough.  You may use Ibuprofen or Tylenol as directed to help your symptoms.     In addition you may use A prescription cough medication called Tessalon Perles 100mg . You may take 1-2 capsules every 8 hours as needed for your cough.  Prednisone 5 mg daily for 6 days (see taper instructions below)  Directions for 6 day taper: Day 1: 2 tablets before breakfast, 1 after both lunch & dinner and 2 at bedtime Day 2: 1 tab before breakfast, 1 after both lunch & dinner and 2 at bedtime Day 3: 1 tab at each meal & 1 at bedtime Day 4: 1 tab at breakfast, 1 at lunch, 1 at bedtime Day 5: 1 tab at breakfast & 1 tab at bedtime Day 6: 1 tab at breakfast   From your responses in the eVisit questionnaire you describe inflammation in the upper respiratory tract which is causing a significant cough.  This is commonly called Bronchitis and has four common causes:    Allergies  Viral Infections  Acid Reflux  Bacterial Infection Allergies, viruses and acid reflux are treated by controlling symptoms or eliminating the cause. An example might be a cough caused by taking certain blood pressure medications. You stop the cough by changing the medication. Another example might be a cough caused by acid reflux. Controlling the reflux helps control the cough.  USE OF BRONCHODILATOR ("RESCUE") INHALERS: There is a risk from using your bronchodilator too frequently.  The risk is that over-reliance on a medication which only relaxes the muscles surrounding the breathing tubes can reduce the effectiveness of medications prescribed to reduce swelling and congestion of the tubes themselves.  Although you feel brief relief from the bronchodilator inhaler, your asthma may  actually be worsening with the tubes becoming more swollen and filled with mucus.  This can delay other crucial treatments, such as oral steroid medications. If you need to use a bronchodilator inhaler daily, several times per day, you should discuss this with your provider.  There are probably better treatments that could be used to keep your asthma under control.     HOME CARE . Only take medications as instructed by your medical team. . Complete the entire course of an antibiotic. . Drink plenty of fluids and get plenty of rest. . Avoid close contacts especially the very young and the elderly . Cover your mouth if you cough or cough into your sleeve. . Always remember to wash your hands . A steam or ultrasonic humidifier can help congestion.   GET HELP RIGHT AWAY IF: . You develop worsening fever. . You become short of breath . You cough up blood. . Your symptoms persist after you have completed your treatment plan MAKE SURE YOU   Understand these instructions.  Will watch your condition.  Will get help right away if you are not doing well or get worse.  Your e-visit answers were reviewed by a board certified advanced clinical practitioner to complete your personal care plan.  Depending on the condition, your plan could have included both over the counter or prescription medications. If there is a problem please reply  once you have received a response from your provider. Your  safety is important to [name].  If you have drug allergies check your prescription carefully.    You can use MyChart to ask questions about today's visit, request a non-urgent call back, or ask for a work or school excuse for 24 hours related to this e-Visit. If it has been greater than 24 hours you will need to follow up with your provider, or enter a new e-Visit to address those concerns. You will get an e-mail in the next two days asking about your experience.  I hope that your e-visit has been valuable and will  speed your recovery. Thank you for using e-visits.  Greater than 5 minutes, yet less than 10 minutes of time have been spent researching, coordinating and implementing care for this patient today.

## 2020-04-22 ENCOUNTER — Encounter: Payer: Self-pay | Admitting: Family Medicine

## 2020-04-22 MED ORDER — OMEPRAZOLE 40 MG PO CPDR
40.0000 mg | DELAYED_RELEASE_CAPSULE | Freq: Two times a day (BID) | ORAL | 3 refills | Status: DC
Start: 2020-04-22 — End: 2020-05-31

## 2020-05-10 ENCOUNTER — Telehealth: Payer: 59 | Admitting: Physician Assistant

## 2020-05-10 DIAGNOSIS — R059 Cough, unspecified: Secondary | ICD-10-CM

## 2020-05-10 MED ORDER — BENZONATATE 100 MG PO CAPS: 100.0000 mg | ORAL_CAPSULE | Freq: Three times a day (TID) | ORAL | 0 refills | Status: DC | PRN

## 2020-05-10 MED ORDER — PREDNISONE 20 MG PO TABS: 40.0000 mg | ORAL_TABLET | Freq: Every day | ORAL | 0 refills | Status: DC

## 2020-05-10 NOTE — Progress Notes (Signed)
We are sorry that you are not feeling well.  Here is how we plan to help!  Based on your presentation I believe you most likely have A cough due to a virus.  This is called viral bronchitis and is best treated by rest, plenty of fluids and control of the cough.  You may use Ibuprofen or Tylenol as directed to help your symptoms.     In addition you may use A prescription cough medication called Tessalon Perles 100mg . You may take 1-2 capsules every 8 hours as needed for your cough.  Prednisone 40 mg daily for 5 days  From your responses in the eVisit questionnaire you describe inflammation in the upper respiratory tract which is causing a significant cough.  This is commonly called Bronchitis and has four common causes:    Allergies  Viral Infections  Acid Reflux  Bacterial Infection Allergies, viruses and acid reflux are treated by controlling symptoms or eliminating the cause. An example might be a cough caused by taking certain blood pressure medications. You stop the cough by changing the medication. Another example might be a cough caused by acid reflux. Controlling the reflux helps control the cough.  USE OF BRONCHODILATOR ("RESCUE") INHALERS: There is a risk from using your bronchodilator too frequently.  The risk is that over-reliance on a medication which only relaxes the muscles surrounding the breathing tubes can reduce the effectiveness of medications prescribed to reduce swelling and congestion of the tubes themselves.  Although you feel brief relief from the bronchodilator inhaler, your asthma may actually be worsening with the tubes becoming more swollen and filled with mucus.  This can delay other crucial treatments, such as oral steroid medications. If you need to use a bronchodilator inhaler daily, several times per day, you should discuss this with your provider.  There are probably better treatments that could be used to keep your asthma under control.     HOME CARE . Only  take medications as instructed by your medical team. . Complete the entire course of an antibiotic. . Drink plenty of fluids and get plenty of rest. . Avoid close contacts especially the very young and the elderly . Cover your mouth if you cough or cough into your sleeve. . Always remember to wash your hands . A steam or ultrasonic humidifier can help congestion.   GET HELP RIGHT AWAY IF: . You develop worsening fever. . You become short of breath . You cough up blood. . Your symptoms persist after you have completed your treatment plan MAKE SURE YOU   Understand these instructions.  Will watch your condition.  Will get help right away if you are not doing well or get worse.  Your e-visit answers were reviewed by a board certified advanced clinical practitioner to complete your personal care plan.  Depending on the condition, your plan could have included both over the counter or prescription medications. If there is a problem please reply  once you have received a response from your provider. Your safety is important to .  If you have drug allergies check your prescription carefully.    You can use MyChart to ask questions about today's visit, request a non-urgent call back, or ask for a work or school excuse for 24 hours related to this e-Visit. If it has been greater than 24 hours you will need to follow up with your provider, or enter a new e-Visit to address those concerns. You will get an e-mail in the next two  days asking about your experience.  I hope that your e-visit has been valuable and will speed your recovery. Thank you for using e-visits.  Greater than 5 minutes, yet less than 10 minutes of time have been spent researching, coordinating, and implementing care for this patient today

## 2020-05-11 ENCOUNTER — Ambulatory Visit: Payer: Self-pay

## 2020-05-13 ENCOUNTER — Other Ambulatory Visit: Payer: Self-pay | Admitting: Family Medicine

## 2020-05-26 ENCOUNTER — Other Ambulatory Visit: Payer: Self-pay | Admitting: Family Medicine

## 2020-05-30 ENCOUNTER — Ambulatory Visit: Payer: 59 | Admitting: Family Medicine

## 2020-05-31 ENCOUNTER — Telehealth: Payer: Self-pay

## 2020-05-31 ENCOUNTER — Encounter (INDEPENDENT_AMBULATORY_CARE_PROVIDER_SITE_OTHER): Payer: Self-pay

## 2020-05-31 ENCOUNTER — Telehealth: Payer: 59 | Admitting: Physician Assistant

## 2020-05-31 ENCOUNTER — Other Ambulatory Visit: Payer: Self-pay | Admitting: Family Medicine

## 2020-05-31 DIAGNOSIS — Z1152 Encounter for screening for COVID-19: Secondary | ICD-10-CM | POA: Diagnosis not present

## 2020-05-31 DIAGNOSIS — J029 Acute pharyngitis, unspecified: Secondary | ICD-10-CM | POA: Diagnosis not present

## 2020-05-31 MED ORDER — AZELASTINE HCL 0.1 % NA SOLN: 1.0000 | Freq: Two times a day (BID) | NASAL | 0 refills | Status: DC

## 2020-05-31 NOTE — Progress Notes (Signed)
Hi Amy Nichols,   Keep [name] updated on the results of your COVID test when they come back. In the meantime,    Your current symptoms could be consistent with the coronavirus.  Many health care providers can now test patients at their office but not all are.    We are enrolling you in our MyChart Home Monitoring for COVID19 . Daily you will receive a questionnaire within the MyChart website. Our COVID 19 response team will be monitoring your responses daily.   Please quarantine yourself while awaiting your test results. Please stay home for a minimum of 10 days from the first day of illness with improving symptoms and you have had 24 hours of no fever (without the use of Tylenol (Acetaminophen) Motrin (Ibuprofen) or any fever reducing medication).  Also - Do not get tested prior to returning to work because once you have had a positive test the test can stay positive for more than a month in some cases.   You should wear a mask or cloth face covering over your nose and mouth if you must be around other people or animals, including pets (even at home). Try to stay at least 6 feet away from other people. This will protect the people around you.  Please continue good preventive care measures, including:  frequent hand-washing, avoid touching your face, cover coughs/sneezes, stay out of crowds and keep a 6 foot distance from others.  COVID-19 is a respiratory illness with symptoms that are similar to the flu. Symptoms are typically mild to moderate, but there have been cases of severe illness and death due to the virus.    For throat pain, we recommend over the counter oral pain relief medications such as acetaminophen or aspirin, or anti-inflammatory medications such as ibuprofen or naproxen sodium.  Topical treatments such as oral throat lozenges or sprays may be used as needed.    Home Care:  Only take medications as instructed by your medical team.  Do not drink alcohol while taking these  medications.  A steam or ultrasonic humidifier can help congestion.  You can place a towel over your head and breathe in the steam from hot water coming from a faucet.  Avoid close contacts especially the very young and the elderly.  Cover your mouth when you cough or sneeze.  Always remember to wash your hands.     The following symptoms may appear 2-14 days after exposure: . Fever . Cough . Shortness of breath or difficulty breathing . Chills . Repeated shaking with chills . Muscle pain . Headache . Sore throat . New loss of taste or smell . Fatigue . Congestion or runny nose . Nausea or vomiting . Diarrhea  Go to the nearest hospital ED for assessment if fever/cough/breathlessness are severe or illness seems like a threat to life.  It is vitally important that if you feel that you have an infection such as this virus or any other virus that you stay home and away from places where you may spread it to others.  You should avoid contact with people age 3 and older.   You can use medication such as  A prescription for Azelastine nasal spray 2 sprays in each nostril twice per day  You may also take acetaminophen (Tylenol) as needed for fever.  Reduce your risk of any infection by using the same precautions used for avoiding the common cold or flu:  Marland Kitchen Wash your hands often with soap and warm water for at  least 20 seconds.  If soap and water are not readily available, use an alcohol-based hand sanitizer with at least 60% alcohol.  . If coughing or sneezing, cover your mouth and nose by coughing or sneezing into the elbow areas of your shirt or coat, into a tissue or into your sleeve (not your hands). . Avoid shaking hands with others and consider head nods or verbal greetings only. . Avoid touching your eyes, nose, or mouth with unwashed hands.  . Avoid close contact with people who are sick. . Avoid places or events with large numbers of people in one location, like concerts or  sporting events. . Carefully consider travel plans you have or are making. . If you are planning any travel outside or inside the [name], visit the CDC's Travelers' Health webpage for the latest health notices. . If you have some symptoms but not all symptoms, continue to monitor at home and seek medical attention if your symptoms worsen. . If you are having a medical emergency, call 911.  HOME CARE . Only take medications as instructed by your medical team. . Drink plenty of fluids and get plenty of rest. . A steam or ultrasonic humidifier can help if you have congestion.   GET HELP RIGHT AWAY IF YOU HAVE EMERGENCY WARNING SIGNS** FOR COVID-19. If you or someone is showing any of these signs seek emergency medical care immediately. Call 911 or proceed to your closest emergency facility if: . You develop worsening high fever. . Trouble breathing . Bluish lips or face . Persistent pain or pressure in the chest . New confusion . Inability to wake or stay awake . You cough up blood. . Your symptoms become more severe  **This list is not all possible symptoms. Contact your medical provider for any symptoms that are sever or concerning to you.  MAKE SURE YOU   Understand these instructions.  Will watch your condition.  Will get help right away if you are not doing well or get worse.  Your e-visit answers were reviewed by a board certified advanced clinical practitioner to complete your personal care plan.  Depending on the condition, your plan could have included both over the counter or prescription medications.  If there is a problem please reply once you have received a response from your provider.  Your safety is important to [name].  If you have drug allergies check your prescription carefully.    You can use MyChart to ask questions about today's visit, request a non-urgent call back, or ask for a work or school excuse for 24 hours related to this e-Visit. If it has been greater than 24  hours you will need to follow up with your provider, or enter a new e-Visit to address those concerns. You will get an e-mail in the next two days asking about your experience.  I hope that your e-visit has been valuable and will speed your recovery. Thank you for using e-visits.   Greater than 5 minutes, yet less than 10 minutes of time have been spent researching, coordinating and implementing care for this patient today.

## 2020-05-31 NOTE — Telephone Encounter (Signed)
Patient states she has been eating but just doesn't have a appetite. Patient was  advise on following symptoms as follows:    If appetite becomes worse: encourage patient to drink fluids as tolerated, work their way up to bland solid food such as crackers, pretzels, soup, bread or applesauce and boiled starches.   If patient is unable to tolerate any foods or liquids, notify PCP.   IF PATIENT DEVELOPS SEVERE VOMITING (MORE THAN 6 TIMES A DAY AND OR >8 HOURS) AND/OR SEVERE ABDOMINAL PAIN ADVISE PATIENT TO CALL 911 AND SEEK TREATMENT IN ED  Patient verbalized understanding .

## 2020-06-26 ENCOUNTER — Ambulatory Visit: Payer: 59 | Admitting: Family Medicine

## 2020-06-27 ENCOUNTER — Telehealth: Payer: 59 | Admitting: Physician Assistant

## 2020-06-27 DIAGNOSIS — J029 Acute pharyngitis, unspecified: Secondary | ICD-10-CM

## 2020-06-27 DIAGNOSIS — Z1152 Encounter for screening for COVID-19: Secondary | ICD-10-CM | POA: Diagnosis not present

## 2020-06-27 MED ORDER — BENZONATATE 100 MG PO CAPS: 100.0000 mg | ORAL_CAPSULE | Freq: Three times a day (TID) | ORAL | 0 refills | Status: DC | PRN

## 2020-06-27 MED ORDER — AZELASTINE HCL 0.1 % NA SOLN
1.0000 | Freq: Two times a day (BID) | NASAL | 0 refills | Status: DC
Start: 2020-06-27 — End: 2020-07-25

## 2020-06-27 NOTE — Progress Notes (Signed)
E-Visit for [employer] Virus Screening   Hi Keva,   Your current symptoms could be consistent with the coronavirus.  Many health care providers can now test patients at their office but not all are.  [place] has multiple testing sites. For information on our COVID testing locations and hours go to [email or web address]  We are enrolling you in our MyChart Home Monitoring for COVID19 . Daily you will receive a questionnaire within the MyChart website. Our COVID 19 response team will be monitoring your responses daily.  ** Please note: You can order 4 free at-?home COVID-?19 tests. Orders will usually ship in 7-12 days. Order your tests now so you have them when you need them. [email or web address]   Testing Information: The COVID-19 Community Testing sites are testing BY APPOINTMENT ONLY.  You can schedule online at [email or web address]  If you do not have access to a smart phone or computer you may call [phone] for an appointment.   Additional testing sites in the Community:  . For [facility] Testing sites in [facility]  [email or web address]  . For Pop-up testing sites in [place]  [email or web address]  . For [facility] [email or web address]  . For [facility] testing in [place] and [employer] [email or web address]  . For [facility] testing in [facility]   [email or web address]  For  more information about community testing call [phone]   Please quarantine yourself while awaiting your test results. Please stay home for a minimum of 10 days from the first day of illness with improving symptoms and you have had 24 hours of no fever  (without the use of Tylenol (Acetaminophen) Motrin (Ibuprofen) or any fever reducing medication).  Also - Do not get tested prior to returning to work because once you have had a positive test the test can stay positive for more than a month in some cases.   You should wear a mask or cloth face covering over your nose and mouth if you must be around other people or animals, including pets (even at home). Try to stay at least 6 feet away from other people. This will protect the people around you.  Please continue good preventive care measures, including:  frequent hand-washing, avoid touching your face, cover coughs/sneezes, stay out of crowds and keep a 6 foot distance from others.  COVID-19 is a respiratory illness with symptoms that are similar to the flu. Symptoms are typically mild to moderate, but there have been cases of severe illness and death due to the virus.   The following symptoms may appear 2-14 days after exposure: . Fever . Cough . Shortness of breath or difficulty breathing . Chills . Repeated shaking with chills . Muscle pain . Headache . Sore throat . New loss of taste or smell . Fatigue . Congestion or runny nose . Nausea or vomiting . Diarrhea  Go to the nearest hospital ED for assessment if fever/cough/breathlessness are severe or illness seems like a threat to life.  It is vitally important that if you feel that you have an infection such as this virus or any other virus that you stay home and away from places where you may spread it to others.  You should avoid contact with people age 28 and older.   You can use medication such as  A prescription cough medication called Tessalon Perles 100 mg. You may take 1-2 capsules every 8 hours as needed for cough, and A prescription for Azelastine nasal spray 2 sprays in each nostril twice per day  You may also take acetaminophen (Tylenol) as needed for fever.  Reduce your risk of any infection by using the same precautions used  for avoiding the common cold or flu:  Marland Kitchen Wash your hands often with soap and warm water for at least 20 seconds.  If soap and water are not readily available, use an alcohol-based hand sanitizer with at least 60% alcohol.  . If coughing or sneezing, cover your mouth and nose by coughing or sneezing into the elbow areas of your shirt or coat, into a tissue or into your sleeve (not your hands). . Avoid shaking hands with others and consider head nods or verbal greetings only. . Avoid touching your eyes, nose, or mouth with unwashed hands.  . Avoid close contact with people who are sick. . Avoid places or events with large numbers of people in one location, like concerts or sporting events. . Carefully consider travel plans you have or are making. . If you are planning any travel outside or inside the [name], visit the CDC's Travelers' Health webpage for the latest health notices. . If you have some symptoms but not all symptoms, continue to monitor at home and seek medical attention if your symptoms worsen. . If you are having a medical emergency, call 911.  HOME CARE . Only take medications as instructed by your medical team. . Drink plenty of fluids and get plenty of rest. . A steam or ultrasonic humidifier can help if you have congestion.   GET HELP RIGHT AWAY IF YOU HAVE EMERGENCY WARNING SIGNS** FOR COVID-19. If you or someone is showing any of these signs seek emergency medical care immediately. Call 911 or proceed to your closest emergency facility if: . You develop worsening high fever. . Trouble breathing . Bluish lips or face . Persistent pain or pressure in the chest . New confusion . Inability to wake or stay awake . You cough up blood. . Your symptoms become more severe  **This list is not all possible symptoms. Contact your medical provider for any symptoms that are sever or concerning to you.  MAKE SURE YOU   Understand these instructions.  Will watch your condition.  Will  get help right away if you are not doing well or get worse.  Your e-visit answers were reviewed by a board certified advanced clinical practitioner to complete your personal care plan.  Depending on the condition, your plan could have included both over the counter or prescription medications.  If there is a problem please reply once you have received a response from your provider.  Your safety is important to [name].  If you have drug allergies check your prescription carefully.    You can use MyChart to ask questions about today's visit, request a non-urgent call back, or ask for a work or school excuse for 24 hours related to this e-Visit. If it has been greater than 24 hours you will need to follow up with your provider, or enter a new e-Visit to address those concerns. You will get an e-mail in the next two days asking about your experience.  I hope that your e-visit has been valuable and will speed your recovery. Thank you for using e-visits.    Greater than 5 minutes, yet less than 10 minutes of time have been spent researching, coordinating and implementing care for this patient today.

## 2020-07-17 ENCOUNTER — Telehealth: Payer: 59 | Admitting: Family

## 2020-07-17 DIAGNOSIS — R109 Unspecified abdominal pain: Secondary | ICD-10-CM | POA: Diagnosis not present

## 2020-07-17 DIAGNOSIS — R0602 Shortness of breath: Secondary | ICD-10-CM | POA: Diagnosis not present

## 2020-07-17 NOTE — Progress Notes (Signed)
Based on what you shared with me, I feel your condition warrants further evaluation and I recommend that you be seen for a face to face office visit.  Given you are having shortness of breath, you need to be seen in person today at the urgent care.    NOTE: If you entered your credit card information for this eVisit, you will not be charged. You may see a "hold" on your card for the $35 but that hold will drop off and you will not have a charge processed.   If you are having a true medical emergency please call 911.      For an urgent face to face visit, [facility] [facility] has five urgent care centers for your convenience:     [facility] at [facility] Directions [phone] [address] [place], [place] [address] . 10 am - 6pm Monday - Friday    [facility]) Get Driving Directions [phone] [address] [place], [place] [address] . 10 am to 8 pm Monday-Friday . 12 pm to 8 pm [facility] Urgent Care at [facility] Get Driving Directions [phone] [address] [place], [place] [address] . 8 am to 8 pm Monday-Friday . 9 am to 6 pm Saturday . 11 am to 6 pm Sunday     [facility] at [facility] Get Driving Directions  [phone] [address] [place], [place] [address] . 8 am to 8 pm Monday-Friday . 8 am to 4 pm [facility] Urgent Care at [facility] Directions [phone] [address] [place], [place] [address] . 12 pm to 6 pm Monday-Friday      Your e-visit answers were reviewed by a board certified advanced clinical practitioner to complete your personal care plan.  Thank you for using e-Visits.

## 2020-07-25 ENCOUNTER — Encounter: Payer: Self-pay | Admitting: Family Medicine

## 2020-07-25 ENCOUNTER — Ambulatory Visit (INDEPENDENT_AMBULATORY_CARE_PROVIDER_SITE_OTHER): Payer: 59 | Admitting: Family Medicine

## 2020-07-25 ENCOUNTER — Other Ambulatory Visit: Payer: Self-pay

## 2020-07-25 VITALS — BP 120/77 | HR 95 | Ht 69.0 in | Wt 279.0 lb

## 2020-07-25 DIAGNOSIS — F419 Anxiety disorder, unspecified: Secondary | ICD-10-CM | POA: Diagnosis not present

## 2020-07-25 DIAGNOSIS — R1011 Right upper quadrant pain: Secondary | ICD-10-CM

## 2020-07-25 DIAGNOSIS — Z0000 Encounter for general adult medical examination without abnormal findings: Secondary | ICD-10-CM

## 2020-07-25 DIAGNOSIS — K5900 Constipation, unspecified: Secondary | ICD-10-CM

## 2020-07-25 DIAGNOSIS — E781 Pure hyperglyceridemia: Secondary | ICD-10-CM

## 2020-07-25 DIAGNOSIS — I10 Essential (primary) hypertension: Secondary | ICD-10-CM

## 2020-07-25 DIAGNOSIS — Z1159 Encounter for screening for other viral diseases: Secondary | ICD-10-CM

## 2020-07-25 DIAGNOSIS — Z0001 Encounter for general adult medical examination with abnormal findings: Secondary | ICD-10-CM

## 2020-07-25 MED ORDER — BUPROPION HCL ER (XL) 150 MG PO TB24: 150.0000 mg | ORAL_TABLET | Freq: Every day | ORAL | 2 refills | Status: DC

## 2020-07-25 NOTE — Progress Notes (Signed)
BP 120/77   Pulse 95   Ht _0  (1.753 m)   Wt 279 lb (126.6 kg)   SpO2 97%   BMI 41.20 kg/m    Subjective:   Patient ID: Amy Nichols, female    DOB: 07/07/1995, 25 y.o.   MRN: [id number]  HPI: Amy Nichols is a 25 y.o. female presenting on 07/25/2020 for Medical Management of Chronic Issues, Anxiety, and Hypertension   HPI Well woman exam and physical Patient declined pelvic exam but does want to do breast exam today. Patient denies any chest pain, shortness of breath, headaches or vision issues, abdominal complaints, diarrhea, nausea, vomiting, or joint issues.  Patient takes the birth control but also has a [place], the birth control pill is just to help with the cycles where the Verdia Kuba was causing her to have more consistent bleeding.  She is happy and content with both.  Hypertension Patient is currently on no medication has been diet controlled, and their blood pressure today is 120/77. Patient denies any lightheadedness or dizziness. Patient denies headaches, blurred vision, chest pains, shortness of breath, or weakness. Denies any side effects from medication and is content with current medication.   GERD Patient is currently on omeprazole.  She denies any major symptoms or abdominal pain or belching or burping. She denies any blood in her stool or lightheadedness or dizziness.   Right upper quadrant pain Patient has been having right upper quadrant and right-sided abdominal pain that has been going off and arm for quite some time, she says she will have it for a few days and then will go away for a couple weeks.  The omeprazole has been helping some reduce this but if she is still getting it and she does have significant gallbladder issues in the family wants to get that checked out.  Anxiety recheck Patient is currently taking Zoloft for anxiety.  She still feels like she has a lot of anxiety and then she feels like she has attention issues and she feels like her  mind is always going and she would like to try possibly something that might help with both.  Patient denies any depression or suicidal ideations today.  Relevant past medical, surgical, family and social history reviewed and updated as indicated. Interim medical history since our last visit reviewed. Allergies and medications reviewed and updated.  Review of Systems  Constitutional: Negative for chills and fever.  HENT: Negative for congestion and ear discharge.   Eyes: Negative for visual disturbance.  Respiratory: Negative for chest tightness and shortness of breath.   Cardiovascular: Negative for chest pain and leg swelling.  Gastrointestinal: Positive for abdominal pain, constipation and nausea. Negative for abdominal distention, blood in stool, diarrhea and vomiting.  Genitourinary: Negative for difficulty urinating and dysuria.  Musculoskeletal: Negative for back pain and gait problem.  Skin: Negative for rash.  Neurological: Negative for light-headedness and headaches.  Psychiatric/Behavioral: Positive for decreased concentration. Negative for agitation, behavioral problems, dysphoric mood, self-injury, sleep disturbance and suicidal ideas. The patient is nervous/anxious.   All other systems reviewed and are negative.   Per HPI unless specifically indicated above   Allergies as of 07/25/2020   No Known Allergies     Medication List       Accurate as of July 25, 2020 10:02 AM. If you have any questions, ask your nurse or doctor.        STOP taking these medications   azelastine 0.1 % nasal spray Commonly  known as: ASTELIN Stopped by: Worthy Rancher, MD   benzonatate 100 MG capsule Commonly known as: TESSALON Stopped by: Worthy Rancher, MD   predniSONE 20 MG tablet Commonly known as: DELTASONE Stopped by: Worthy Rancher, MD   PROBIOTIC-10 PO Stopped by: Fransisca Kaufmann Dettinger, MD     TAKE these medications   cetirizine 10 MG tablet Commonly known as:  ZYRTEC Take 1 tablet (10 mg total) by mouth daily.   fluticasone 50 MCG/ACT nasal spray Commonly known as: FLONASE Place 1-2 sprays into both nostrils daily.   norgestimate-ethinyl estradiol 0.25-35 MG-MCG tablet Commonly known as: ORTHO-CYCLEN TAKE 1 TABLET BY MOUTH EVERY DAY   omeprazole 40 MG capsule Commonly known as: PRILOSEC TAKE 1 CAPSULE BY MOUTH EVERY DAY   polyethylene glycol 17 g packet Commonly known as: MIRALAX / GLYCOLAX Take 17 g by mouth daily.   psyllium 58.6 % packet Commonly known as: METAMUCIL Take 2 packets by mouth daily.   sertraline 100 MG tablet Commonly known as: ZOLOFT Take 1 tablet (100 mg total) by mouth daily.        Objective:   BP 120/77   Pulse 95   Ht _0  (1.753 m)   Wt 279 lb (126.6 kg)   SpO2 97%   BMI 41.20 kg/m   Wt Readings from Last 3 Encounters:  07/25/20 279 lb (126.6 kg)  03/04/20 272 lb (123.4 kg)  01/23/20 279 lb 8.7 oz (126.8 kg)    Physical Exam Vitals and nursing note reviewed. Exam conducted with a chaperone present.  Constitutional:      General: She is not in acute distress.    Appearance: She is well-developed. She is not diaphoretic.  Eyes:     Conjunctiva/sclera: Conjunctivae normal.  Cardiovascular:     Rate and Rhythm: Normal rate and regular rhythm.     Heart sounds: Normal heart sounds. No murmur heard.   Pulmonary:     Effort: Pulmonary effort is normal. No respiratory distress.     Breath sounds: Normal breath sounds. No wheezing.  Chest:     Chest wall: No mass, swelling or tenderness.  Breasts: Breasts are symmetrical.     Right: No inverted nipple, mass, nipple discharge, skin change, tenderness, axillary adenopathy or supraclavicular adenopathy.     Left: No inverted nipple, mass, nipple discharge, skin change, tenderness, axillary adenopathy or supraclavicular adenopathy.    Abdominal:     General: Abdomen is flat. Bowel sounds are normal. There is no distension.     Palpations:  Abdomen is soft.     Tenderness: There is abdominal tenderness. There is no right CVA tenderness, left CVA tenderness, guarding or rebound.  Musculoskeletal:        General: No tenderness. Normal range of motion.  Lymphadenopathy:     Upper Body:     Right upper body: No supraclavicular, axillary or pectoral adenopathy.     Left upper body: No supraclavicular, axillary or pectoral adenopathy.  Skin:    General: Skin is warm and dry.     Findings: No rash.  Neurological:     Mental Status: She is alert and oriented to person, place, and time.     Coordination: Coordination normal.  Psychiatric:        Behavior: Behavior normal.       Assessment & Plan:   Problem List Items Addressed This Visit      Cardiovascular and Mediastinum   Essential hypertension   Relevant Orders  CMP14+EGFR     Other   Morbid obesity (HCC)   Relevant Medications   buPROPion (WELLBUTRIN XL) 150 MG 24 hr tablet   Hypertriglyceridemia   Relevant Orders   Lipid panel    Other Visit Diagnoses    Well adult exam    -  Primary   Relevant Orders   CBC with Differential/Platelet   CMP14+EGFR   Lipid panel   TSH   Hepatitis C antibody   Need for hepatitis C screening test       Relevant Orders   Hepatitis C antibody   Anxiety       Relevant Medications   buPROPion (WELLBUTRIN XL) 150 MG 24 hr tablet   RUQ abdominal pain       Relevant Orders   US ABDOMEN LIMITED RUQ (LIVER/GB)   Constipation, unspecified constipation type          Double MiraLAX for constipation, recommended to get the right upper quadrant abdominal ultrasound to make sure no gallbladder issues.  If she still having issues with the bowels and constipation we may need to go see gastroenterology in the future but will try the double the MiraLAX and ultrasound and see what we come with.  We will have the Wellbutrin to see if helps with anxiety, she thinks she also has attention issues so see if it helps with both and possibly  help with weight loss as well because of appetite suppressant properties. Follow up plan: Return in about 5 weeks (around 08/29/2020), or if symptoms worsen or fail to improve, for Anxiety recheck.  Counseling provided for all of the vaccine components No orders of the defined types were placed in this encounter.   Caryl Pina, MD [facility] 07/25/2020, 10:02 AM

## 2020-07-26 LAB — LIPID PANEL
Chol/HDL Ratio: 3.3 ratio (ref 0.0–4.4)
Cholesterol, Total: 179 mg/dL (ref 100–199)
HDL: 54 mg/dL (ref >39)
LDL Chol Calc (NIH): 109 mg/dL — ABNORMAL HIGH (ref 0–99)
Triglycerides: 88 mg/dL (ref 0–149)
VLDL Cholesterol Cal: 16 mg/dL (ref 5–40)

## 2020-07-26 LAB — CBC WITH DIFFERENTIAL/PLATELET
Basophils Absolute: 0 10*3/uL (ref 0.0–0.2)
Basos: 0 %
EOS (ABSOLUTE): 0 10*3/uL (ref 0.0–0.4)
Eos: 0 %
Hematocrit: 38 % (ref 34.0–46.6)
Hemoglobin: 12.3 g/dL (ref 11.1–15.9)
Immature Grans (Abs): 0 10*3/uL (ref 0.0–0.1)
Immature Granulocytes: 0 %
Lymphocytes Absolute: 1.8 10*3/uL (ref 0.7–3.1)
Lymphs: 28 %
MCH: 27.6 pg (ref 26.6–33.0)
MCHC: 32.4 g/dL (ref 31.5–35.7)
MCV: 85 fL (ref 79–97)
Monocytes Absolute: 0.5 10*3/uL (ref 0.1–0.9)
Monocytes: 9 %
Neutrophils Absolute: 4 10*3/uL (ref 1.4–7.0)
Neutrophils: 63 %
Platelets: 156 10*3/uL (ref 150–450)
RBC: 4.45 x10E6/uL (ref 3.77–5.28)
RDW: 13.4 % (ref 11.7–15.4)
WBC: 6.3 10*3/uL (ref 3.4–10.8)

## 2020-07-26 LAB — CMP14+EGFR
ALT: 20 IU/L (ref 0–32)
AST: 19 IU/L (ref 0–40)
Albumin/Globulin Ratio: 1.3 (ref 1.2–2.2)
Albumin: 4 g/dL (ref 3.9–5.0)
Alkaline Phosphatase: 71 IU/L (ref 44–121)
BUN/Creatinine Ratio: 9 (ref 9–23)
BUN: 8 mg/dL (ref 6–20)
Bilirubin Total: 0.6 mg/dL (ref 0.0–1.2)
CO2: 20 mmol/L (ref 20–29)
Calcium: 8.9 mg/dL (ref 8.7–10.2)
Chloride: 106 mmol/L (ref 96–106)
Creatinine, Ser: 0.87 mg/dL (ref 0.57–1.00)
Globulin, Total: 3.2 g/dL (ref 1.5–4.5)
Glucose: 96 mg/dL (ref 65–99)
Potassium: 4.2 mmol/L (ref 3.5–5.2)
Sodium: 140 mmol/L (ref 134–144)
Total Protein: 7.2 g/dL (ref 6.0–8.5)
eGFR: 94 mL/min/{1.73_m2} (ref >59)

## 2020-07-26 LAB — HEPATITIS C ANTIBODY: Hep C Virus Ab: 0.2 s/co ratio (ref 0.0–0.9)

## 2020-07-26 LAB — TSH: TSH: 0.666 u[IU]/mL (ref 0.450–4.500)

## 2020-08-02 ENCOUNTER — Ambulatory Visit (HOSPITAL_COMMUNITY)
Admission: RE | Admit: 2020-08-02 | Discharge: 2020-08-02 | Disposition: A | Discharge status: Home / Self Care | Payer: 59 | Source: Ambulatory Visit | Attending: Family Medicine | Admitting: Family Medicine

## 2020-08-02 DIAGNOSIS — R1011 Right upper quadrant pain: Secondary | ICD-10-CM | POA: Insufficient documentation

## 2020-08-16 ENCOUNTER — Other Ambulatory Visit: Payer: Self-pay | Admitting: Family Medicine

## 2020-08-16 DIAGNOSIS — F419 Anxiety disorder, unspecified: Secondary | ICD-10-CM

## 2020-09-04 ENCOUNTER — Ambulatory Visit: Payer: 59 | Admitting: Family Medicine

## 2020-09-07 ENCOUNTER — Telehealth: Payer: 59 | Admitting: Physician Assistant

## 2020-09-07 DIAGNOSIS — R300 Dysuria: Secondary | ICD-10-CM | POA: Diagnosis not present

## 2020-09-07 MED ORDER — CEPHALEXIN 500 MG PO CAPS: 500.0000 mg | ORAL_CAPSULE | Freq: Two times a day (BID) | ORAL | 0 refills | Status: AC

## 2020-09-07 NOTE — Progress Notes (Signed)
I have spent 5 minutes in review of e-visit questionnaire, review and updating patient chart, medical decision making and response to patient.   Arisbel Maione Cody Alesi Zachery, PA-C    

## 2020-09-07 NOTE — Progress Notes (Signed)

## 2020-09-09 ENCOUNTER — Other Ambulatory Visit: Payer: Self-pay | Admitting: Family Medicine

## 2020-09-09 DIAGNOSIS — F419 Anxiety disorder, unspecified: Secondary | ICD-10-CM

## 2020-09-09 MED ORDER — BUPROPION HCL ER (XL) 150 MG PO TB24: 1.0000 | ORAL_TABLET | Freq: Every day | ORAL | 0 refills | Status: DC

## 2020-09-20 ENCOUNTER — Telehealth: Payer: 59 | Admitting: Emergency Medicine

## 2020-09-20 DIAGNOSIS — R197 Diarrhea, unspecified: Secondary | ICD-10-CM | POA: Diagnosis not present

## 2020-09-20 DIAGNOSIS — R112 Nausea with vomiting, unspecified: Secondary | ICD-10-CM

## 2020-09-20 MED ORDER — ONDANSETRON 4 MG PO TBDP: 4.0000 mg | ORAL_TABLET | Freq: Three times a day (TID) | ORAL | 0 refills | Status: DC | PRN

## 2020-09-20 NOTE — Progress Notes (Signed)
Amy Nichols,  Thank you for the additional information. We are sorry that you are not feeling well.  Here is how we plan to help!  Based on what you have shared with me it looks like you have Acute Infectious Diarrhea.  Most cases of acute diarrhea are due to infections with virus and bacteria and are self-limited conditions lasting less than 14 days.  For your symptoms you may take Imodium 2 mg tablets that are over the counter at your local pharmacy. Take two tablet now and then one after each loose stool up to 6 a day.  Antibiotics are not needed for most people with diarrhea.  Optional: Zofran 4 mg 1 tablet every 8 hours as needed for nausea and vomiting    HOME CARE  We recommend changing your diet to help with your symptoms for the next few days.  Drink plenty of fluids that contain water salt and sugar. Sports drinks such as Gatorade may help.   You may try broths, soups, bananas, applesauce, soft breads, mashed potatoes or crackers.   You are considered infectious for as long as the diarrhea continues. Hand washing or use of alcohol based hand sanitizers is recommend.  It is best to stay out of work or school until your symptoms stop.   GET HELP RIGHT AWAY  If you have dark yellow colored urine or do not pass urine frequently you should drink more fluids.    If your symptoms worsen   If you feel like you are going to pass out (faint)  You have a new problem  MAKE SURE YOU   Understand these instructions.  Will watch your condition.  Will get help right away if you are not doing well or get worse.  Your e-visit answers were reviewed by a board certified advanced clinical practitioner to complete your personal care plan.  Depending on the condition, your plan could have included both over the counter or prescription medications.  If there is a problem please reply  once you have received a response from your provider.  Your safety is important to [name].  If you have  drug allergies check your prescription carefully.    You can use MyChart to ask questions about today's visit, request a non-urgent call back, or ask for a work or school excuse for 24 hours related to this e-Visit. If it has been greater than 24 hours you will need to follow up with your provider, or enter a new e-Visit to address those concerns.   You will get an e-mail in the next two days asking about your experience.  I hope that your e-visit has been valuable and will speed your recovery. Thank you for using e-visits.  Greater than 5 minutes, yet less than 10 minutes of time have been spent researching, coordinating, and implementing care for this patient today.

## 2020-10-17 ENCOUNTER — Telehealth: Payer: 59 | Admitting: Physician Assistant

## 2020-10-17 DIAGNOSIS — B373 Candidiasis of vulva and vagina: Secondary | ICD-10-CM | POA: Diagnosis not present

## 2020-10-17 DIAGNOSIS — B3731 Acute candidiasis of vulva and vagina: Secondary | ICD-10-CM

## 2020-10-17 MED ORDER — FLUCONAZOLE 150 MG PO TABS
150.0000 mg | ORAL_TABLET | Freq: Once | ORAL | 0 refills | Status: AC
Start: 2020-10-17 — End: 2020-10-17

## 2020-10-17 NOTE — Progress Notes (Signed)
I have spent 5 minutes in review of e-visit questionnaire, review and updating patient chart, medical decision making and response to patient.   Yosiah Jasmin Cody Robie Oats, PA-C    

## 2020-10-17 NOTE — Progress Notes (Signed)

## 2020-10-21 ENCOUNTER — Telehealth: Payer: 59 | Admitting: Nurse Practitioner

## 2020-10-21 ENCOUNTER — Other Ambulatory Visit: Payer: Self-pay | Admitting: Nurse Practitioner

## 2020-10-21 DIAGNOSIS — L309 Dermatitis, unspecified: Secondary | ICD-10-CM

## 2020-10-21 MED ORDER — EUCRISA 2 % EX OINT: TOPICAL_OINTMENT | CUTANEOUS | 0 refills | Status: DC

## 2020-10-21 NOTE — Progress Notes (Signed)
E Visit for Rash  We are sorry that you are not feeling well. Here is how we plan to help! I contacted patient by phone to clarify. She says this rash comes and goes and has been there for about 4 years. It itches and burns really bad. She has used cream on it in the past which will make it go away for awhile.  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 I have prescribed eucrisa to apply BID.    HOME CARE:  Take cool showers and avoid direct sunlight. Apply cool compress or wet dressings. Take a bath in an oatmeal bath.  Sprinkle content of one Aveeno packet under running faucet with comfortably warm water.  Bathe for 15-20 minutes, 1-2 times daily.  Pat dry with a towel. Do not rub the rash. Use hydrocortisone cream. Take an antihistamine like Benadryl for widespread rashes that itch.  The adult dose of Benadryl is 25-50 mg by mouth 4 times daily. Caution:  This type of medication may cause sleepiness.  Do not drink alcohol, drive, or operate dangerous machinery while taking antihistamines.  Do not take these medications if you have prostate enlargement.  Read package instructions thoroughly on all medications that you take.  GET HELP RIGHT AWAY IF:  Symptoms don't go away after treatment. Severe itching that persists. If you rash spreads or swells. If you rash begins to smell. If it blisters and opens or develops a yellow-brown crust. You develop a fever. You have a sore throat. You become short of breath.  MAKE SURE YOU:  Understand these instructions. Will watch your condition. Will get help right away if you are not doing well or get worse.  Thank you for choosing an e-visit. Your e-visit answers were reviewed by a board certified advanced clinical practitioner to complete your personal care plan. Depending upon the condition, your plan could have included both over the counter or prescription medications. Please review your pharmacy choice. Be sure that the  pharmacy you have chosen is open so that you can pick up your prescription now.  If there is a problem you may message your provider in MyChart to have the prescription routed to another pharmacy. Your safety is important to [name]. If you have drug allergies check your prescription carefully.  For the next 24 hours, you can use MyChart to ask questions about today's visit, request a non-urgent call back, or ask for a work or school excuse from your e-visit provider. You will get an email in the next two days asking about your experience. I hope that your e-visit has been valuable and will speed your recovery.   5-10 minutes spent reviewing and documenting in chart.

## 2020-10-24 ENCOUNTER — Ambulatory Visit: Payer: 59 | Admitting: Family Medicine

## 2020-10-25 ENCOUNTER — Encounter: Payer: Self-pay | Admitting: Family Medicine

## 2020-10-28 NOTE — Telephone Encounter (Signed)
Amy Nichols is not covered below is what is covered  PA REQUIREMENT  Betamethasone Dipropionate (Crea) Not Required Betamethasone Valerate (Crea) Not Required Clobetasol (Gel) Not Required Fluocinolone 0.025 % Crea Not Required Fluocinonide 0.1 % Crea Not Required Halobetasol Propionate (Oint) Not Required Mometasone (Crea) Not Required Pimecrolimus Not Required Tacrolimus 0.03 % Oint Not Required Triamcinolone Acetonide 0.025 % Oint Not Required

## 2020-10-31 NOTE — Telephone Encounter (Signed)
Patient aware.

## 2020-11-27 ENCOUNTER — Telehealth: Payer: 59 | Admitting: Family

## 2020-11-27 DIAGNOSIS — U071 COVID-19: Secondary | ICD-10-CM

## 2020-11-27 MED ORDER — ALBUTEROL SULFATE HFA 108 (90 BASE) MCG/ACT IN AERS: 2.0000 | INHALATION_SPRAY | Freq: Four times a day (QID) | RESPIRATORY_TRACT | 0 refills | Status: DC | PRN

## 2020-11-27 MED ORDER — FLUTICASONE PROPIONATE 50 MCG/ACT NA SUSP: 2.0000 | Freq: Every day | NASAL | 6 refills | Status: DC

## 2020-11-27 MED ORDER — BENZONATATE 100 MG PO CAPS: 100.0000 mg | ORAL_CAPSULE | Freq: Three times a day (TID) | ORAL | 0 refills | Status: DC | PRN

## 2020-11-27 NOTE — Progress Notes (Signed)
E-Visit  for Positive Covid Test Result  We are sorry you are not feeling well. We are here to help!  You have tested positive for COVID-19, meaning that you were infected with the novel coronavirus and could give the virus to others.  It is vitally important that you stay home so you do not spread it to others.      Please continue isolation at home, for at least 10 days since the start of your symptoms and until you have had 24 hours with no fever (without taking a fever reducer) and with improving of symptoms.  If you have no symptoms but tested positive (or all symptoms resolve after 5 days and you have no fever) you can leave your house but continue to wear a mask around others for an additional 5 days. If you have a fever,continue to stay home until you have had 24 hours of no fever. Most cases improve 5-10 days from onset but we have seen a small number of patients who have gotten worse after the 10 days.  Please be sure to watch for worsening symptoms and remain taking the proper precautions.   Go to the nearest hospital ED for assessment if fever/cough/breathlessness are severe or illness seems like a threat to life.    The following symptoms may appear 2-14 days after exposure: Fever Cough Shortness of breath or difficulty breathing Chills Repeated shaking with chills Muscle pain Headache Sore throat New loss of taste or smell Fatigue Congestion or runny nose Nausea or vomiting Diarrhea  You have been enrolled in MyChart Home Monitoring for COVID-19. Daily you will receive a questionnaire within the MyChart website. Our COVID-19 response team will be monitoring your responses daily.  You can use medication such as prescription cough medication called Tessalon Perles 100 mg. You may take 1-2 capsules every 8 hours as needed for cough,  prescription inhaler called Albuterol MDI 90 mcg /actuation 2 puffs every 4 hours as needed for shortness of breath, wheezing, cough, and  prescription for Fluticasone nasal spray 2 sprays in each nostril one time per day.  I do recommend you start antivirals, but need to do a video visit to get these. I have sent a work note to Pharmacologist.   You may also take acetaminophen (Tylenol) as needed for fever.  HOME CARE: Only take medications as instructed by your medical team. Drink plenty of fluids and get plenty of rest. A steam or ultrasonic humidifier can help if you have congestion.   GET HELP RIGHT AWAY IF YOU HAVE EMERGENCY WARNING SIGNS.  Call 911 or proceed to your closest emergency facility if: You develop worsening high fever. Trouble breathing Bluish lips or face Persistent pain or pressure in the chest New confusion Inability to wake or stay awake You cough up blood. Your symptoms become more severe Inability to hold down food or fluids  This list is not all possible symptoms. Contact your medical provider for any symptoms that are severe or concerning to you.    Your e-visit answers were reviewed by a board certified advanced clinical practitioner to complete your personal care plan.  Depending on the condition, your plan could have included both over the counter or prescription medications.  If there is a problem please reply once you have received a response from your provider.  Your safety is important to [name].  If you have drug allergies check your prescription carefully.    You can use MyChart to ask  questions about today's visit, request a non-urgent call back, or ask for a work or school excuse for 24 hours related to this e-Visit. If it has been greater than 24 hours you will need to follow up with your provider, or enter a new e-Visit to address those concerns. You will get an e-mail in the next two days asking about your experience.  I hope that your e-visit has been valuable and will speed your recovery. Thank you for using e-visits.    Approximately 5 minutes was spent documenting and reviewing  patient's chart.

## 2020-12-05 ENCOUNTER — Encounter (INDEPENDENT_AMBULATORY_CARE_PROVIDER_SITE_OTHER): Payer: Self-pay

## 2020-12-05 ENCOUNTER — Telehealth: Payer: Self-pay

## 2020-12-05 NOTE — Telephone Encounter (Signed)
Patient states the cough started last night. Patient states that she has normally does tea with honey and lemon. Patient dose have some cough syrup she can take. Patient advise per protocol on cough as follows:   If cough remains the same or better: continue to treat with over the counter medications. Hard candy or cough drops and drinking warm fluids. Adults can also use honey 2 tsp (10 ML) at bedtime.   HONEY IS NOT RECOMMENDED FOR INFANTS UNDER ONE.   If cough is becoming worse even with the use of over the counter medications and patient is not able to sleep at night, cough becomes productive with sputum that maybe yellow or green in color, contact PCP.

## 2020-12-20 ENCOUNTER — Ambulatory Visit (INDEPENDENT_AMBULATORY_CARE_PROVIDER_SITE_OTHER): Payer: 59

## 2020-12-20 ENCOUNTER — Ambulatory Visit (INDEPENDENT_AMBULATORY_CARE_PROVIDER_SITE_OTHER): Payer: 59 | Admitting: Family Medicine

## 2020-12-20 ENCOUNTER — Encounter: Payer: Self-pay | Admitting: Family Medicine

## 2020-12-20 ENCOUNTER — Other Ambulatory Visit: Payer: Self-pay

## 2020-12-20 VITALS — BP 129/82 | HR 93 | Temp 98.2°F | Ht 69.0 in | Wt 273.8 lb

## 2020-12-20 DIAGNOSIS — M545 Low back pain, unspecified: Secondary | ICD-10-CM | POA: Diagnosis not present

## 2020-12-20 DIAGNOSIS — M25561 Pain in right knee: Secondary | ICD-10-CM

## 2020-12-20 DIAGNOSIS — G8929 Other chronic pain: Secondary | ICD-10-CM | POA: Diagnosis not present

## 2020-12-20 MED ORDER — NAPROXEN 500 MG PO TABS: 500.0000 mg | ORAL_TABLET | Freq: Two times a day (BID) | ORAL | 0 refills | Status: AC

## 2020-12-20 MED ORDER — KETOROLAC TROMETHAMINE 30 MG/ML IJ SOLN
30.0000 mg | Freq: Once | INTRAMUSCULAR | Status: AC
  Administered 2020-12-20: 30 mg via INTRAMUSCULAR

## 2020-12-20 NOTE — Progress Notes (Signed)
Subjective:  Patient ID: Amy Nichols, female    DOB: [birth year], 25 y.o.   MRN: [id number]  Patient Care Team: Dettinger, Fransisca Kaufmann, MD as PCP - General (Family Medicine)   Chief Complaint:  Knee Pain (Right x 3 months and has gotten worse ) and Back Pain (Lower back pain x 3 months )   HPI: Amy Nichols is a 25 y.o. female presenting on 12/20/2020 for Knee Pain (Right x 3 months and has gotten worse ) and Back Pain (Lower back pain x 3 months )   Knee Pain  Incident onset: 2-3 months ago after fall. The incident occurred at home. The injury mechanism was a fall. The pain is present in the right knee. The quality of the pain is described as aching, burning and stabbing. The pain is at a severity of 6/10. The pain is moderate. The pain has been Fluctuating since onset. Pertinent negatives include no inability to bear weight, loss of motion, loss of sensation, muscle weakness, numbness or tingling. She reports no foreign bodies present. The symptoms are aggravated by movement, weight bearing and palpation. She has tried NSAIDs and acetaminophen for the symptoms. The treatment provided mild relief.  Back Pain This is a chronic problem. Episode onset: several months ago. The problem has been waxing and waning since onset. The pain is present in the lumbar spine. The quality of the pain is described as aching, stabbing and cramping. The pain does not radiate. The pain is at a severity of 6/10. The pain is moderate. The symptoms are aggravated by bending, lying down, sitting, twisting and position. Pertinent negatives include no abdominal pain, bladder incontinence, bowel incontinence, chest pain, dysuria, fever, headaches, leg pain, numbness, paresis, paresthesias, pelvic pain, perianal numbness, tingling, weakness or weight loss. Risk factors include obesity, sedentary lifestyle and lack of exercise. She has tried analgesics, NSAIDs and chiropractic manipulation for the symptoms. The  treatment provided mild relief.    Relevant past medical, surgical, family, and social history reviewed and updated as indicated.  Allergies and medications reviewed and updated. Data reviewed: Chart in Epic.   Past Medical History:  Diagnosis Date   Allergies    GERD (gastroesophageal reflux disease)    Hypertension    no meds at this time    Past Surgical History:  Procedure Laterality Date   tear duct probe     TONSILLECTOMY AND ADENOIDECTOMY Bilateral 01/23/2020   Procedure: TONSILLECTOMY AND ADENOIDECTOMY;  Surgeon: Leta Baptist, MD;  Location: [facility];  Service: ENT;  Laterality: Bilateral;    Social History   Socioeconomic History   Marital status: Single    Spouse name: Not on file   Number of children: 0   Years of education: 15   Highest education level: Not on file  Occupational History   Occupation: college    Comment: athletics   Occupation: In home care  Tobacco Use   Smoking status: Never   Smokeless tobacco: Never  Vaping Use   Vaping Use: Never used  Substance and Sexual Activity   Alcohol use: No   Drug use: No   Sexual activity: Never    Birth control/protection: None  Other Topics Concern   Not on file  Social History Narrative   Lives with grandmother and grandfather (paternal)   Works    In Secretary/administrator - criminal justice   Social Determinants of Radio broadcast assistant Strain: Not on file  Food Insecurity: Not on  file  Transportation Needs: Not on file  Physical Activity: Not on file  Stress: Not on file  Social Connections: Not on file  Intimate Partner Violence: Not on file    Outpatient Encounter Medications as of 12/20/2020  Medication Sig   albuterol (VENTOLIN HFA) 108 (90 Base) MCG/ACT inhaler Inhale 2 puffs into the lungs every 6 (six) hours as needed for wheezing or shortness of breath.   buPROPion (WELLBUTRIN XL) 150 MG 24 hr tablet Take 1 tablet (150 mg total) by mouth daily.   cetirizine (ZYRTEC) 10 MG  tablet Take 1 tablet (10 mg total) by mouth daily.   Crisaborole (EUCRISA) 2 % OINT Apply small amount to area BID.   fluticasone (FLONASE) 50 MCG/ACT nasal spray Place 2 sprays into both nostrils daily.   naproxen (NAPROSYN) 500 MG tablet Take 1 tablet (500 mg total) by mouth 2 (two) times daily with a meal for 14 days.   norgestimate-ethinyl estradiol (ORTHO-CYCLEN) 0.25-35 MG-MCG tablet TAKE 1 TABLET BY MOUTH EVERY DAY   omeprazole (PRILOSEC) 40 MG capsule TAKE 1 CAPSULE BY MOUTH EVERY DAY   sertraline (ZOLOFT) 100 MG tablet Take 1 tablet (100 mg total) by mouth daily.   [DISCONTINUED] benzonatate (TESSALON PERLES) 100 MG capsule Take 1 capsule (100 mg total) by mouth 3 (three) times daily as needed.   [DISCONTINUED] ondansetron (ZOFRAN-ODT) 4 MG disintegrating tablet Take 1 tablet (4 mg total) by mouth every 8 (eight) hours as needed for nausea or vomiting.   [DISCONTINUED] polyethylene glycol (MIRALAX / GLYCOLAX) 17 g packet Take 17 g by mouth daily.   [DISCONTINUED] psyllium (METAMUCIL) 58.6 % packet Take 2 packets by mouth daily.   [EXPIRED] ketorolac (TORADOL) 30 MG/ML injection 30 mg    No facility-administered encounter medications on file as of 12/20/2020.    No Known Allergies  Review of Systems  Constitutional:  Negative for activity change, appetite change, chills, diaphoresis, fatigue, fever, unexpected weight change and weight loss.  HENT: Negative.    Eyes: Negative.   Respiratory:  Negative for cough, chest tightness and shortness of breath.   Cardiovascular:  Negative for chest pain, palpitations and leg swelling.  Gastrointestinal:  Negative for abdominal pain, blood in stool, bowel incontinence, constipation, diarrhea, nausea and vomiting.  Endocrine: Negative.   Genitourinary:  Negative for bladder incontinence, decreased urine volume, difficulty urinating, dysuria, frequency, pelvic pain and urgency.  Musculoskeletal:  Positive for arthralgias, back pain, gait problem  and myalgias. Negative for joint swelling, neck pain and neck stiffness.  Skin: Negative.   Allergic/Immunologic: Negative.   Neurological:  Negative for dizziness, tingling, tremors, seizures, syncope, facial asymmetry, speech difficulty, weakness, light-headedness, numbness, headaches and paresthesias.  Hematological: Negative.   Psychiatric/Behavioral:  Negative for confusion, hallucinations, sleep disturbance and suicidal ideas.   All other systems reviewed and are negative.      Objective:  BP 129/82   Pulse 93   Temp 98.2 F (36.8 C) (Temporal)   Ht 5\' 9"  (1.753 m)   Wt 273 lb 12.8 oz (124.2 kg)   BMI 40.43 kg/m    Wt Readings from Last 3 Encounters:  12/20/20 273 lb 12.8 oz (124.2 kg)  07/25/20 279 lb (126.6 kg)  03/04/20 272 lb (123.4 kg)    Physical Exam Vitals and nursing note reviewed.  Constitutional:      General: She is not in acute distress.    Appearance: Normal appearance. She is well-developed and well-groomed. She is not ill-appearing, toxic-appearing or diaphoretic.  HENT:  Head: Normocephalic and atraumatic.     Jaw: There is normal jaw occlusion.     Right Ear: Hearing normal.     Left Ear: Hearing normal.     Nose: Nose normal.     Mouth/Throat:     Lips: Pink.     Mouth: Mucous membranes are moist.     Pharynx: Oropharynx is clear. Uvula midline.  Eyes:     General: Lids are normal.     Extraocular Movements: Extraocular movements intact.     Conjunctiva/sclera: Conjunctivae normal.     Pupils: Pupils are equal, round, and reactive to light.  Neck:     Thyroid: No thyroid mass, thyromegaly or thyroid tenderness.     Vascular: No carotid bruit or JVD.     Trachea: Trachea and phonation normal.  Cardiovascular:     Rate and Rhythm: Normal rate and regular rhythm.     Chest Wall: PMI is not displaced.     Pulses: Normal pulses.     Heart sounds: Normal heart sounds. No murmur heard.   No friction rub. No gallop.  Pulmonary:     Effort:  Pulmonary effort is normal. No respiratory distress.     Breath sounds: Normal breath sounds. No wheezing.  Abdominal:     General: Bowel sounds are normal. There is no distension or abdominal bruit.     Palpations: Abdomen is soft. There is no hepatomegaly or splenomegaly.     Tenderness: There is no abdominal tenderness. There is no right CVA tenderness or left CVA tenderness.     Hernia: No hernia is present.  Musculoskeletal:     Cervical back: Normal, normal range of motion and neck supple.     Thoracic back: Normal.     Lumbar back: Tenderness present. No swelling, edema, deformity, signs of trauma, lacerations, spasms or bony tenderness. Decreased range of motion (due to pain). Negative right straight leg raise test and negative left straight leg raise test. No scoliosis.     Right hip: Normal.     Left hip: Normal.     Right upper leg: Normal.     Left upper leg: Normal.     Right knee: No swelling, deformity, effusion, erythema, ecchymosis, lacerations, bony tenderness or crepitus. Normal range of motion. Tenderness present over the lateral joint line. No medial joint line, MCL, LCL, ACL, PCL or patellar tendon tenderness. No LCL laxity, MCL laxity, ACL laxity or PCL laxity. Normal alignment, normal meniscus and normal patellar mobility. Normal pulse.     Instability Tests: Anterior drawer test negative. Posterior drawer test negative. Anterior Lachman test negative. Medial McMurray test negative and lateral McMurray test negative.     Left knee: Normal.     Right lower leg: Normal. No edema.     Left lower leg: Normal. No edema.  Lymphadenopathy:     Cervical: No cervical adenopathy.  Skin:    General: Skin is warm and dry.     Capillary Refill: Capillary refill takes less than 2 seconds.     Coloration: Skin is not cyanotic, jaundiced or pale.     Findings: No rash.  Neurological:     General: No focal deficit present.     Mental Status: She is alert and oriented to person,  place, and time.     Cranial Nerves: Cranial nerves are intact. No cranial nerve deficit.     Sensory: Sensation is intact. No sensory deficit.     Motor: Motor function is intact.  No weakness.     Coordination: Coordination is intact. Coordination normal.     Gait: Gait normal.     Deep Tendon Reflexes: Reflexes are normal and symmetric. Reflexes normal.  Psychiatric:        Attention and Perception: Attention and perception normal.        Mood and Affect: Mood and affect normal.        Speech: Speech normal.        Behavior: Behavior normal. Behavior is cooperative.        Thought Content: Thought content normal.        Cognition and Memory: Cognition and memory normal.        Judgment: Judgment normal.    Results for orders placed or performed in visit on 07/25/20  CBC with Differential/Platelet  Result Value Ref Range   WBC 6.3 3.4 - 10.8 x10E3/uL   RBC 4.45 3.77 - 5.28 x10E6/uL   Hemoglobin 12.3 11.1 - 15.9 g/dL   Hematocrit 38.0 34.0 - 46.6 %   MCV 85 79 - 97 fL   MCH 27.6 26.6 - 33.0 pg   MCHC 32.4 31.5 - 35.7 g/dL   RDW 13.4 11.7 - 15.4 %   Platelets 156 150 - 450 x10E3/uL   Neutrophils 63 Not Estab. %   Lymphs 28 Not Estab. %   Monocytes 9 Not Estab. %   Eos 0 Not Estab. %   Basos 0 Not Estab. %   Neutrophils Absolute 4.0 1.4 - 7.0 x10E3/uL   Lymphocytes Absolute 1.8 0.7 - 3.1 x10E3/uL   Monocytes Absolute 0.5 0.1 - 0.9 x10E3/uL   EOS (ABSOLUTE) 0.0 0.0 - 0.4 x10E3/uL   Basophils Absolute 0.0 0.0 - 0.2 x10E3/uL   Immature Granulocytes 0 Not Estab. %   Immature Grans (Abs) 0.0 0.0 - 0.1 x10E3/uL  CMP14+EGFR  Result Value Ref Range   Glucose 96 65 - 99 mg/dL   BUN 8 6 - 20 mg/dL   Creatinine, Ser 0.87 0.57 - 1.00 mg/dL   eGFR 94 >59 mL/min/1.73   BUN/Creatinine Ratio 9 9 - 23   Sodium 140 134 - 144 mmol/L   Potassium 4.2 3.5 - 5.2 mmol/L   Chloride 106 96 - 106 mmol/L   CO2 20 20 - 29 mmol/L   Calcium 8.9 8.7 - 10.2 mg/dL   Total Protein 7.2 6.0 - 8.5 g/dL    Albumin 4.0 3.9 - 5.0 g/dL   Globulin, Total 3.2 1.5 - 4.5 g/dL   Albumin/Globulin Ratio 1.3 1.2 - 2.2   Bilirubin Total 0.6 0.0 - 1.2 mg/dL   Alkaline Phosphatase 71 44 - 121 IU/L   AST 19 0 - 40 IU/L   ALT 20 0 - 32 IU/L  Lipid panel  Result Value Ref Range   Cholesterol, Total 179 100 - 199 mg/dL   Triglycerides 88 0 - 149 mg/dL   HDL 54 >39 mg/dL   VLDL Cholesterol Cal 16 5 - 40 mg/dL   LDL Chol Calc (NIH) 109 (H) 0 - 99 mg/dL   Chol/HDL Ratio 3.3 0.0 - 4.4 ratio  TSH  Result Value Ref Range   TSH 0.666 0.450 - 4.500 uIU/mL  Hepatitis C antibody  Result Value Ref Range   Hep C Virus Ab 0.2 0.0 - 0.9 s/co ratio     X-Ray: right knee: No acute findings. Preliminary x-ray reading by Monia Pouch, FNP-C, [facility].   Pertinent labs & imaging results that were available during my care of the  patient were reviewed by me and considered in my medical decision making.  Assessment & Plan:  Amy Nichols was seen today for knee pain and back pain.  Diagnoses and all orders for this visit:  Chronic midline low back pain without sciatica Ongoing low back pain. No red flags present concerning for cauda equina syndrome. No new injuries. Will refer to PT. Naproxen for 14 days as prescribed. Report any new or worsening symptoms.  -     Ambulatory referral to Physical Therapy -     naproxen (NAPROSYN) 500 MG tablet; Take 1 tablet (500 mg total) by mouth 2 (two) times daily with a meal for 14 days.  Chronic pain of right knee Lateral knee tenderness after fall 2-3 months ago. Imaging in office without acute findings, will notify pt if radiology reading differs. Brace applied in office along with toradol injection for pain. Referral to PT. Symptomatic care discussed in detail. Pt aware to report any new or worsening symptoms.  -     Ambulatory referral to Physical Therapy -     DG Knee 1-2 Views Right; Future -     naproxen (NAPROSYN) 500 MG tablet; Take 1 tablet (500 mg total) by mouth 2 (two)  times daily with a meal for 14 days. -     ketorolac (TORADOL) 30 MG/ML injection 30 mg     Continue all other maintenance medications.  Follow up plan: Return in about 6 weeks (around 01/31/2021), or if symptoms worsen or fail to improve, for PCP.   Continue healthy lifestyle choices, including diet (rich in fruits, vegetables, and lean proteins, and low in salt and simple carbohydrates) and exercise (at least 30 minutes of moderate physical activity daily).  Educational handout given for back and knee pain  The above assessment and management plan was discussed with the patient. The patient verbalized understanding of and has agreed to the management plan. Patient is aware to call the clinic if they develop any new symptoms or if symptoms persist or worsen. Patient is aware when to return to the clinic for a follow-up visit. Patient educated on when it is appropriate to go to the emergency department.   Monia Pouch, FNP-C [facility] [phone]

## 2020-12-30 ENCOUNTER — Ambulatory Visit: Payer: 59 | Attending: Family Medicine

## 2020-12-30 ENCOUNTER — Other Ambulatory Visit: Payer: Self-pay

## 2020-12-30 DIAGNOSIS — M25561 Pain in right knee: Secondary | ICD-10-CM

## 2020-12-30 DIAGNOSIS — M545 Low back pain, unspecified: Secondary | ICD-10-CM | POA: Insufficient documentation

## 2020-12-30 DIAGNOSIS — G8929 Other chronic pain: Secondary | ICD-10-CM

## 2020-12-31 NOTE — Therapy (Signed)
[facility] [address] [place], [place], [address] Phone: [phone]   Fax:  [phone]  Physical Therapy Evaluation  Patient Details  Name: Amy Nichols MRN: [id number] Date of Birth: 01-06-1996 Referring Provider (PT): Gilford Silvius, FNP   Encounter Date: 12/30/2020   PT End of Session - 12/30/20 1650     Visit Number 1    Number of Visits 12    Date for PT Re-Evaluation 02/10/21    PT Start Time 1030    PT Stop Time 1120    PT Time Calculation (min) 50 min    Activity Tolerance Patient tolerated treatment well   normal response to interventions and modalities noted   Behavior During Therapy [facility] for tasks assessed/performed             Past Medical History:  Diagnosis Date   Allergies    GERD (gastroesophageal reflux disease)    Hypertension    no meds at this time    Past Surgical History:  Procedure Laterality Date   tear duct probe     TONSILLECTOMY AND ADENOIDECTOMY Bilateral 01/23/2020   Procedure: TONSILLECTOMY AND ADENOIDECTOMY;  Surgeon: Newman Pies, MD;  Location: [facility];  Service: ENT;  Laterality: Bilateral;    There were no vitals filed for this visit.    Subjective Assessment - 12/30/20 1045     Subjective Paitient states that she has been having back pain for over a year - she has been having knee pain on right knee for several months now. She descrbes the back pain as tender and impossible to bend over because of it.  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 She has stairs (3 steps) to go up at night - she is stiff in the morning and takes some time to get around before the pain subsidees. She reports the pain is  on the center of her low back. Sometime it is tough to drive - she can drive 94-70 minutes - shje has to lean over counter to wash dishes. the other night she laid on the floor with her feet elevated to  decompres sher back. No changes in bowel or bladder - she denies recent falls/    Limitations Sitting;Standing    How long can you walk comfortably? some days I have a cach and it depends on what I have done the day before.    Patient Stated Goals to be bale to bend over, sit and stand    Currently in Pain? Yes    Pain Score 8     Pain Location Back    Pain Orientation Medial    Pain Descriptors / Indicators Aching;Discomfort;Pressure;Sharp;Grimacing    Pain Type Chronic pain;Acute pain    Pain Onset Today    Pain Frequency Constant    Aggravating Factors  bending or standing up straight    Multiple Pain Sites Yes    Pain Score 3    Pain Location Knee    Pain Orientation Right    Pain Descriptors / Indicators Aching;Pressure;Sore    Pain Type Acute pain    Pain Onset More than a month ago    Pain Frequency Intermittent                [facility] PT Assessment - 12/30/20 1030       Assessment   Medical Diagnosis Chronis Low Back Pain,  R knee pain    Referring Provider (PT) Gilford Silvius, FNP    Prior Therapy Chiropractic care      Balance Screen   Has the patient fallen in the past 6 months No      Prior Function   Level of Independence Independent    Vocation Unemployed      Cognition   Overall Cognitive Status Within Functional Limits for tasks assessed      Observation/Other Assessments   Observations Unremarkable ; FHP    Cranial Nerve(s) intact      Sensation   Light Touch Appears Intact      Coordination   Gross Motor Movements are Fluid and Coordinated Yes      ROM / Strength   AROM / PROM / Strength Strength      Strength   Overall Strength Comments WFL; 5/5  except R hip flexor 4/5    Strength Assessment Site Hip;Other (comment)   and knee - 5/5     Palpation   Patella mobility limited inferior mobiltiy of the patella    Spinal mobility TTP at the L2-L5, TTP at the lu,mbr paraspinals    Palpation comment TTP ae supraptaler cigopiur on ER LE hr       Special Tests    Special Tests Lumbar;Sacrolliac Tests;Knee Special Tests    Lumbar Tests FABER test;Straight Leg Raise    Sacroiliac Tests  Pelvic Compression      FABER test   findings Negative      Straight Leg Raise   Findings Negative      Sacral thrust    Findings Negative                        Objective measurements completed on examination: See above findings.               PT Education - 12/30/20 1100     Education Details Access Code: [email or web address]  URL: [email or web address]  Date: 12/31/2020  Prepared by:    Exercises  Standing with Back Flat Against Wall - 1 x daily - 7 x weekly - 3 sets - 10 reps  Child's Pose Stretch - 1 x daily - 7 x weekly - 3 sets - 10 reps  Seated Piriformis Stretch with Trunk Bend - 1 x daily - 7 x weekly - 1 sets - 10 reps - 5sec hold    Person(s) Educated Patient    Methods Explanation;Demonstration    Comprehension Verbalized understanding;Returned demonstration              PT Short Term Goals - 12/30/20 1645       PT SHORT TERM GOAL #1   Title Patient to be indep with an initial HEP    Baseline lacks awareness of isolated activities    Time 2    Period Weeks    Status New    Target Date 01/13/21               PT Long Term Goals - 12/30/20 1646       PT LONG TERM GOAL #1   Title Patient to be indep with advanced HEP    Baseline inital HEP proided    Time 3    Status New    Target Date 01/20/21      PT LONG TERM GOAL #2   Title Patient will be able to perform all transfers without increased low back pain    Baseline  discomfort rolling/laying on side, pressure laying on back    Time 4    Period Weeks    Status New    Target Date 01/28/21      PT LONG TERM GOAL #3   Title Patient will be able to perform repeated lifting with appropriate form and no increased symptoms greater than 2/10.    Baseline pain with squat positition or bending - guarded/compensations    Time 6    Period  Weeks    Status New    Target Date 02/10/21      PT LONG TERM GOAL #4   Title Patient will be able to report increased standing endurance to 30 minutes or greater twithout limitation.    Baseline cannot stand to wash dishes    Time 6    Period Weeks    Status New    Target Date 02/10/21                    Plan - 12/31/20 1639     Clinical Impression Statement Amy Nichols is a pleasanr 26 yo female who presents to OPPT for concerns regarding chronic low back pain and acute knee pain. She demonstrates limited trunk flexion with pain but increased tolerance to extension. Knee pain was reduced with inferior patellar mobilizations however not resolved. Negativ findings for crossed SLR, SLR, or FABERs upon exam. She has postural deficits of excessive lumbar lordosis and signs ans symptoms appear consistent with mechanical deficits with possible discogenic pathology. Mobility gained at the thoracic spine with central mobilizations. Skilled PT recommedned to address remaining deficits and increase ease of functional mobility and standing endurance tolerance.    Personal Factors and Comorbidities Comorbidity 1;Time since onset of injury/illness/exacerbation    Comorbidities obesity    Examination-Activity Limitations Lift;Stand;Stairs;Squat    Examination-Participation Restrictions Interpersonal Relationship;Yard Work;Other    Stability/Clinical Decision Making Stable/Uncomplicated    Clinical Decision Making Low    Rehab Potential Good    PT Frequency 2x / week    PT Duration 6 weeks    PT Treatment/Interventions Electrical Stimulation;ADLs/Self Care Home Management;Ultrasound;Moist Heat;Traction;Therapeutic activities;Therapeutic exercise;Neuromuscular re-education;Patient/family education;Manual techniques    Consulted and Agree with Plan of Care Patient             Patient will benefit from skilled therapeutic intervention in order to improve the following deficits and  impairments:  Decreased activity tolerance, Decreased endurance, Decreased range of motion, Decreased strength, Hypomobility, Pain, Decreased mobility, Impaired flexibility  Visit Diagnosis: Chronic midline low back pain without sciatica  Acute pain of right knee     Problem List Patient Active Problem List   Diagnosis Date Noted   Hypertriglyceridemia 04/08/2018   GERD without esophagitis 07/23/2016   Essential hypertension 10/03/2015   Morbid obesity (HCC) 10/03/2015    Levonne Spiller PT, DPT  12/31/2020, 4:52 PM  [facility] [address] [place], [place], [address] Phone: [phone]   Fax:  [phone]  Name: Amy Nichols MRN: [id number] Date of Birth: [birth year]

## 2021-01-01 ENCOUNTER — Ambulatory Visit: Payer: 59 | Admitting: Physical Therapy

## 2021-01-06 ENCOUNTER — Ambulatory Visit: Payer: 59 | Admitting: Family Medicine

## 2021-01-08 ENCOUNTER — Encounter: Payer: Self-pay | Admitting: Family Medicine

## 2021-01-12 ENCOUNTER — Telehealth: Payer: 59 | Admitting: Physician Assistant

## 2021-01-12 DIAGNOSIS — U071 COVID-19: Secondary | ICD-10-CM

## 2021-01-12 DIAGNOSIS — Z20822 Contact with and (suspected) exposure to covid-19: Secondary | ICD-10-CM | POA: Diagnosis not present

## 2021-01-13 MED ORDER — BENZONATATE 100 MG PO CAPS
100.0000 mg | ORAL_CAPSULE | Freq: Three times a day (TID) | ORAL | 0 refills | Status: DC | PRN
Start: 2021-01-13 — End: 2021-05-23

## 2021-01-13 MED ORDER — ALBUTEROL SULFATE HFA 108 (90 BASE) MCG/ACT IN AERS: 2.0000 | INHALATION_SPRAY | Freq: Four times a day (QID) | RESPIRATORY_TRACT | 0 refills | Status: DC | PRN

## 2021-01-13 NOTE — Progress Notes (Signed)
E-Visit for Corona Virus Screening  Your current symptoms could be consistent with the coronavirus.  Many health care providers can now test patients at their office but not all are.  [facility] has multiple testing sites. For information on our COVID testing locations and hours go to [email or web address]  We are enrolling you in our MyChart Home Monitoring for COVID19 . Daily you will receive a questionnaire within the MyChart website. Our COVID 19 response team will be monitoring your responses daily.  Testing Information: The COVID-19 Community Testing sites are testing BY APPOINTMENT ONLY.  You can schedule online at [email or web address]  If you do not have access to a smart phone or computer you may call [phone] for an appointment.   Additional testing sites in the Community:  For [facility] Testing sites in [place]  https://www.cvs.com/minuteclinic/covid-19-testing  For Pop-up testing sites in [place]  https://covid19.ncdhhs.gov/about-covid-19/testing/find-my-testing-place/pop-testing-sites  For [facility] https://[place].gov[place]/human-services/health-department/coronavirus-covid-19-info/covid-19-testing  For [place] testing in [place] https://www.[place].gov/[place]/human-services/health-department/coronavirus-covid-19-info/covid-19-testing  For [facility] testing in [place]   https://lhi.care/covidtesting  For  more information about community testing call [phone]   Please quarantine yourself while awaiting your test results. Please stay home for a minimum of 10 days from the first day of illness with improving symptoms and you have had 24 hours of no fever (without the use of Tylenol (Acetaminophen) Motrin (Ibuprofen) or any fever reducing medication).  Also - Do not get tested prior to returning to work because once you have had a positive test the test can stay positive for  more than a month in some cases.   You should wear a mask or cloth face covering over your nose and mouth if you must be around other people or animals, including pets (even at home). Try to stay at least 6 feet away from other people. This will protect the people around you.  Please continue good preventive care measures, including:  frequent hand-washing, avoid touching your face, cover coughs/sneezes, stay out of crowds and keep a 6 foot distance from others.  COVID-19 is a respiratory illness with symptoms that are similar to the flu. Symptoms are typically mild to moderate, but there have been cases of severe illness and death due to the virus.   The following symptoms may appear 2-14 days after exposure: Fever Cough Shortness of breath or difficulty breathing Chills Repeated shaking with chills Muscle pain Headache Sore throat New loss of taste or smell Fatigue Congestion or runny nose Nausea or vomiting Diarrhea  Go to the nearest hospital ED for assessment if fever/cough/breathlessness are severe or illness seems like a threat to life.  It is vitally important that if you feel that you have an infection such as this virus or any other virus that you stay home and away from places where you may spread it to others.  You should avoid contact with people age 65 and older.   You can use medication such as prescription cough medication called Tessalon Perles 100 mg. You may take 1-2 capsules every 8 hours as needed for cough and  prescription inhaler called Albuterol MDI 90 mcg /actuation 2 puffs every 4 hours as needed for shortness of breath, wheezing, cough  You may also take acetaminophen (Tylenol) as needed for fever.  Reduce your risk of any infection by using the same precautions used for avoiding the common cold or flu:  Wash your hands often with soap and warm water for at least 20 seconds.  If soap and   water are not readily available, use an alcohol-based hand sanitizer with at  least 60% alcohol.  If coughing or sneezing, cover your mouth and nose by coughing or sneezing into the elbow areas of your shirt or coat, into a tissue or into your sleeve (not your hands). Avoid shaking hands with others and consider head nods or verbal greetings only. Avoid touching your eyes, nose, or mouth with unwashed hands.  Avoid close contact with people who are sick. Avoid places or events with large numbers of people in one location, like concerts or sporting events. Carefully consider travel plans you have or are making. If you are planning any travel outside or inside the [name], visit the CDC's Travelers' Health webpage for the latest health notices. If you have some symptoms but not all symptoms, continue to monitor at home and seek medical attention if your symptoms worsen. If you are having a medical emergency, call 911.  HOME CARE Only take medications as instructed by your medical team. Drink plenty of fluids and get plenty of rest. A steam or ultrasonic humidifier can help if you have congestion.   GET HELP RIGHT AWAY IF YOU HAVE EMERGENCY WARNING SIGNS** FOR COVID-19. If you or someone is showing any of these signs seek emergency medical care immediately. Call 911 or proceed to your closest emergency facility if: You develop worsening high fever. Trouble breathing Bluish lips or face Persistent pain or pressure in the chest New confusion Inability to wake or stay awake You cough up blood. Your symptoms become more severe  **This list is not all possible symptoms. Contact your medical provider for any symptoms that are sever or concerning to you.  MAKE SURE YOU  Understand these instructions. Will watch your condition. Will get help right away if you are not doing well or get worse.  Your e-visit answers were reviewed by a board certified advanced clinical practitioner to complete your personal care plan.  Depending on the condition, your plan could have included  both over the counter or prescription medications.  If there is a problem please reply once you have received a response from your provider.  Your safety is important to [name].  If you have drug allergies check your prescription carefully.    You can use MyChart to ask questions about today's visit, request a non-urgent call back, or ask for a work or school excuse for 24 hours related to this e-Visit. If it has been greater than 24 hours you will need to follow up with your provider, or enter a new e-Visit to address those concerns. You will get an e-mail in the next two days asking about your experience.  I hope that your e-visit has been valuable and will speed your recovery. Thank you for using e-visits.  I provided 5 minutes of non face-to-face time during this encounter for chart review and documentation.

## 2021-01-14 ENCOUNTER — Telehealth: Payer: 59 | Admitting: Physician Assistant

## 2021-01-14 DIAGNOSIS — J45901 Unspecified asthma with (acute) exacerbation: Secondary | ICD-10-CM

## 2021-01-14 DIAGNOSIS — R079 Chest pain, unspecified: Secondary | ICD-10-CM

## 2021-01-14 NOTE — Progress Notes (Signed)
Based on what you shared with me, I feel your condition warrants further evaluation and I recommend that you be seen in a face to face visit. I can see you had an e-visit yesterday at which time you were started on medication to help breathing and cough while being instructed to proceed with COVID testing. Giving chest pain along with other symptoms and worsening symptoms, you need an in-person evaluation for a lung exam, assessment of your blood oxygenation, COVID testing, etc to make sure you get the most appropriate treatment and that nothing is being missed.    NOTE: There will be NO CHARGE for this eVisit   If you are having a true medical emergency please call 911.      For an urgent face to face visit, [facility] has six urgent care centers for your convenience:     [facility] Health Ur[facility] Center at [facility] Directions [phone] [address] [place], [place] [address]    [facility] Health Urgent Care Center [facility]) Get Driving Directions [phone] [address] [place], [place] [address]  [facility] Health Urgent Care Center [facility]) Get Driving Directions [phone] [address] [place],  [place]  [address]  [facility] Health Urgent Care at [facility] Get Driving Directions [phone] [address] [place], [place] [address]   [facility] Health Urgent Care at [facility] Get Driving Directions  [phone] [address] [place], [place] [address]   [facility] Health Urgent Care at [facility] Directions [phone] [address] [place], [place] [address]  Your MyChart E-visit questionnaire answers were reviewed by a board certified advanced clinical practitioner to complete your personal care plan based on your specific symptoms.  Thank you for using e-Visits.

## 2021-01-29 ENCOUNTER — Ambulatory Visit: Payer: 59 | Admitting: Family Medicine

## 2021-01-30 ENCOUNTER — Encounter: Payer: Self-pay | Admitting: Family Medicine

## 2021-02-09 NOTE — Telephone Encounter (Signed)
No response

## 2021-03-07 ENCOUNTER — Ambulatory Visit (INDEPENDENT_AMBULATORY_CARE_PROVIDER_SITE_OTHER): Payer: 59

## 2021-03-07 ENCOUNTER — Encounter: Payer: Self-pay | Admitting: Emergency Medicine

## 2021-03-07 ENCOUNTER — Other Ambulatory Visit: Payer: Self-pay

## 2021-03-07 ENCOUNTER — Ambulatory Visit
Admission: EM | Admit: 2021-03-07 | Discharge: 2021-03-07 | Disposition: A | Discharge status: Home / Self Care | Payer: 59 | Attending: Urgent Care | Admitting: Urgent Care

## 2021-03-07 ENCOUNTER — Telehealth: Payer: 59 | Admitting: Physician Assistant

## 2021-03-07 DIAGNOSIS — U071 COVID-19: Secondary | ICD-10-CM

## 2021-03-07 DIAGNOSIS — R0602 Shortness of breath: Secondary | ICD-10-CM

## 2021-03-07 DIAGNOSIS — J018 Other acute sinusitis: Secondary | ICD-10-CM

## 2021-03-07 MED ORDER — AMOXICILLIN 875 MG PO TABS: 875.0000 mg | ORAL_TABLET | Freq: Two times a day (BID) | ORAL | 0 refills | Status: DC

## 2021-03-07 MED ORDER — PSEUDOEPHEDRINE HCL 30 MG PO TABS: 30.0000 mg | ORAL_TABLET | Freq: Three times a day (TID) | ORAL | 0 refills | Status: DC | PRN

## 2021-03-07 MED ORDER — IPRATROPIUM BROMIDE 0.03 % NA SOLN: 2.0000 | Freq: Two times a day (BID) | NASAL | 0 refills | Status: DC

## 2021-03-07 MED ORDER — CETIRIZINE HCL 10 MG PO TABS: 10.0000 mg | ORAL_TABLET | Freq: Every day | ORAL | 0 refills | Status: DC

## 2021-03-07 NOTE — Progress Notes (Signed)
Based on what you shared with me, I feel your condition warrants further evaluation and I recommend that you be seen in a face to face visit.  With worsening shortness of breath after symptoms being present for 10 days, I am concerned for pneumonia. I feel you should be evaluated in person today for someone to listen to your breathing and consider a chest xray.   NOTE: There will be NO CHARGE for this eVisit   If you are having a true medical emergency please call 911.      For an urgent face to face visit, [facility] has six urgent care centers for your convenience:     [facility] Health Urgent Care Center at [facility] Directions [phone] [address] [place], [place] [address]    [facility] Health Ur[facility] Center [facility]) Get Driving Directions [phone] [address] [place], [place] [address]  [facility] Health Urgent Care Center [facility]) Get Driving Directions [phone] [address] [place],  [place]  [address]  [facility] Health Urgent Care at [facility] Get Driving Directions [phone] [address] [place], [place] [address]   [facility] Health Urgent Care at [facility] Get Driving Directions  [phone] [address] [place], [place] [address]   [facility] Health Urgent Care at [facility] Directions [phone] [address] [place], [place] [address]  Your MyChart E-visit questionnaire answers were reviewed by a board certified advanced clinical practitioner to complete your personal care plan based on your specific symptoms.  Thank you for using e-Visits.   I provided 5 minutes of non face-to-face time during this encounter for chart review and documentation.

## 2021-03-07 NOTE — ED Triage Notes (Signed)
Patient c/o SOB and productive cough w/ "clear thick" sputum x 2 weeks.   Patient endorses chest tightness in the mornings.   Patient endorses night sweats. Patient endorses fatigue.   Patient endorses being COVID POSITIVE on 02/24/2021.  Patient has used Aleve with some relief of symptoms.   Patient has used Tylenol with no relief of symptoms.

## 2021-03-07 NOTE — ED Provider Notes (Signed)
[facility]   MRN: [id number] DOB: 1995/07/12  Subjective:   Amy Nichols is a 25 y.o. female presenting for 2-week history of persistent sinus congestion, postnasal drainage, throat pain, coughing that is eliciting shortness of breath.  Patient tested positive for COVID-19 on 02/24/2021.  She has been using Mucinex, Aleve and Tylenol without relief of her symptoms.  No history of respiratory disorders.  She does have a history of allergies but is not taking anything consistently for this.  No current facility-administered medications for this encounter.  Current Outpatient Medications:    omeprazole (PRILOSEC) 40 MG capsule, TAKE 1 CAPSULE BY MOUTH EVERY DAY, Disp: 90 capsule, Rfl: 0   albuterol (VENTOLIN HFA) 108 (90 Base) MCG/ACT inhaler, Inhale 2 puffs into the lungs every 6 (six) hours as needed for wheezing or shortness of breath., Disp: 8 g, Rfl: 0   benzonatate (TESSALON) 100 MG capsule, Take 1 capsule (100 mg total) by mouth 3 (three) times daily as needed., Disp: 30 capsule, Rfl: 0   buPROPion (WELLBUTRIN XL) 150 MG 24 hr tablet, Take 1 tablet (150 mg total) by mouth daily., Disp: 90 tablet, Rfl: 0   cetirizine (ZYRTEC) 10 MG tablet, Take 1 tablet (10 mg total) by mouth daily., Disp: 30 tablet, Rfl: 11   Crisaborole (EUCRISA) 2 % OINT, Apply small amount to area BID. (Patient not taking: No sig reported), Disp: 60 g, Rfl: 0   fluticasone (FLONASE) 50 MCG/ACT nasal spray, Place 2 sprays into both nostrils daily. (Patient not taking: No sig reported), Disp: 16 g, Rfl: 6   norgestimate-ethinyl estradiol (ORTHO-CYCLEN) 0.25-35 MG-MCG tablet, TAKE 1 TABLET BY MOUTH EVERY DAY, Disp: 84 tablet, Rfl: 2   sertraline (ZOLOFT) 100 MG tablet, Take 1 tablet (100 mg total) by mouth daily., Disp: 90 tablet, Rfl: 3   No Known Allergies  Past Medical History:  Diagnosis Date   Allergies    GERD (gastroesophageal reflux disease)    Hypertension    no meds at this time      Past Surgical History:  Procedure Laterality Date   tear duct probe     TONSILLECTOMY AND ADENOIDECTOMY Bilateral 01/23/2020   Procedure: TONSILLECTOMY AND ADENOIDECTOMY;  Surgeon: Newman Pies, MD;  Location: [facility];  Service: ENT;  Laterality: Bilateral;    Family History  Problem Relation Age of Onset   Asthma Mother    Hypertension Mother    Asthma Father    Hypertension Father    COPD Maternal Grandmother    COPD Maternal Grandfather    Hypertension Paternal Grandmother    Hypertension Paternal Grandfather     Social History   Tobacco Use   Smoking status: Never   Smokeless tobacco: Never  Vaping Use   Vaping Use: Never used  Substance Use Topics   Alcohol use: No   Drug use: No    ROS   Objective:   Vitals: BP 125/84 (BP Location: Right Arm)   Pulse 86   Temp 98.3 F (36.8 C) (Oral)   Resp 18   LMP 02/05/2021 (Approximate)   SpO2 98%   Physical Exam Constitutional:      General: She is not in acute distress.    Appearance: Normal appearance. She is well-developed. She is not ill-appearing, toxic-appearing or diaphoretic.  HENT:     Head: Normocephalic and atraumatic.     Nose: Congestion and rhinorrhea present.     Mouth/Throat:     Mouth: Mucous membranes are moist.  Pharynx: No oropharyngeal exudate or posterior oropharyngeal erythema.  Eyes:     General: No scleral icterus.       Right eye: No discharge.        Left eye: No discharge.     Extraocular Movements: Extraocular movements intact.     Conjunctiva/sclera: Conjunctivae normal.     Pupils: Pupils are equal, round, and reactive to light.  Cardiovascular:     Rate and Rhythm: Normal rate and regular rhythm.     Pulses: Normal pulses.     Heart sounds: Normal heart sounds. No murmur heard.   No friction rub. No gallop.  Pulmonary:     Effort: Pulmonary effort is normal. No respiratory distress.     Breath sounds: Normal breath sounds. No stridor. No decreased  breath sounds, wheezing, rhonchi or rales.  Skin:    General: Skin is warm and dry.     Findings: No rash.  Neurological:     Mental Status: She is alert and oriented to person, place, and time.  Psychiatric:        Mood and Affect: Mood normal.        Behavior: Behavior normal.        Thought Content: Thought content normal.        Judgment: Judgment normal.    DG Chest 2 View  Result Date: 03/07/2021 CLINICAL DATA:  SOB EXAM: CHEST - 2 VIEW COMPARISON:  Cxr 01/14/21. FINDINGS: The heart and mediastinal contours are within normal limits. No focal consolidation. No pulmonary edema. No pleural effusion. No pneumothorax. No acute osseous abnormality. IMPRESSION: No active cardiopulmonary disease. Electronically Signed   By: Tish Frederickson M.D.   On: 03/07/2021 15:13     Assessment and Plan :   PDMP not reviewed this encounter.  1. Acute non-recurrent sinusitis of other sinus    Will start empiric treatment for sinusitis with amoxicillin.  Recommended supportive care otherwise including the use of oral antihistamine, decongestant. Counseled patient on potential for adverse effects with medications prescribed/recommended today, ER and return-to-clinic precautions discussed, patient verbalized understanding.    Wallis Bamberg, PA-C 03/07/21 1527

## 2021-03-17 ENCOUNTER — Other Ambulatory Visit: Payer: Self-pay

## 2021-03-17 ENCOUNTER — Emergency Department (HOSPITAL_COMMUNITY)
Admission: EM | Admit: 2021-03-17 | Discharge: 2021-03-17 | Disposition: A | Discharge status: Home / Self Care | Payer: 59 | Attending: Emergency Medicine | Admitting: Emergency Medicine

## 2021-03-17 ENCOUNTER — Emergency Department (HOSPITAL_COMMUNITY): Payer: 59

## 2021-03-17 DIAGNOSIS — I10 Essential (primary) hypertension: Secondary | ICD-10-CM | POA: Diagnosis not present

## 2021-03-17 DIAGNOSIS — Z20822 Contact with and (suspected) exposure to covid-19: Secondary | ICD-10-CM | POA: Diagnosis not present

## 2021-03-17 DIAGNOSIS — R059 Cough, unspecified: Secondary | ICD-10-CM | POA: Diagnosis present

## 2021-03-17 DIAGNOSIS — J208 Acute bronchitis due to other specified organisms: Secondary | ICD-10-CM | POA: Diagnosis not present

## 2021-03-17 DIAGNOSIS — J101 Influenza due to other identified influenza virus with other respiratory manifestations: Secondary | ICD-10-CM | POA: Diagnosis not present

## 2021-03-17 LAB — RESP PANEL BY RT-PCR (FLU A&B, COVID) ARPGX2
Influenza A by PCR: POSITIVE — AB
Influenza B by PCR: NEGATIVE
SARS Coronavirus 2 by RT PCR: NEGATIVE

## 2021-03-17 MED ORDER — PREDNISONE 50 MG PO TABS
60.0000 mg | ORAL_TABLET | Freq: Once | ORAL | Status: AC
  Administered 2021-03-17: 60 mg via ORAL
  Filled 2021-03-17: qty 1

## 2021-03-17 MED ORDER — PREDNISONE 10 MG (21) PO TBPK: ORAL_TABLET | ORAL | 0 refills | Status: DC

## 2021-03-17 NOTE — ED Provider Notes (Signed)
[facility] EMERGENCY DEPARTMENT Provider Note  CSN: [id number] Arrival date & time: 03/17/21 0401    History Chief Complaint  Patient presents with   Shortness of Breath   Cough    Amy Nichols is a 25 y.o. female with history of asthma. Patient reports over a months of feeling sick. She was initially diagnosed with RSV and then Covid on 10/17. She was still feeling sick on 10/28 and went to UC where she had a neg CXR and started on Amoxil for sinusitis but has not had any improvement. Continues to have cough, SOB. No fevers at home. Some nausea and diarrhea.    Past Medical History:  Diagnosis Date   Allergies    GERD (gastroesophageal reflux disease)    Hypertension    no meds at this time    Past Surgical History:  Procedure Laterality Date   tear duct probe     TONSILLECTOMY AND ADENOIDECTOMY Bilateral 01/23/2020   Procedure: TONSILLECTOMY AND ADENOIDECTOMY;  Surgeon: Leta Baptist, MD;  Location: [facility];  Service: ENT;  Laterality: Bilateral;    Family History  Problem Relation Age of Onset   Asthma Mother    Hypertension Mother    Asthma Father    Hypertension Father    COPD Maternal Grandmother    COPD Maternal Grandfather    Hypertension Paternal Grandmother    Hypertension Paternal Grandfather     Social History   Tobacco Use   Smoking status: Never   Smokeless tobacco: Never  Vaping Use   Vaping Use: Never used  Substance Use Topics   Alcohol use: No   Drug use: No     Home Medications Prior to Admission medications   Medication Sig Start Date End Date Taking? Authorizing Provider  predniSONE (STERAPRED UNI-PAK 21 TAB) 10 MG (21) TBPK tablet 10mg  Tabs, 6 day taper. Use as directed 03/17/21  Yes Truddie Hidden, MD  albuterol (VENTOLIN HFA) 108 (90 Base) MCG/ACT inhaler Inhale 2 puffs into the lungs every 6 (six) hours as needed for wheezing or shortness of breath. 01/13/21   Mar Daring, PA-C  amoxicillin (AMOXIL)  875 MG tablet Take 1 tablet (875 mg total) by mouth 2 (two) times daily. 03/07/21   Jaynee Eagles, PA-C  benzonatate (TESSALON) 100 MG capsule Take 1 capsule (100 mg total) by mouth 3 (three) times daily as needed. 01/13/21   Mar Daring, PA-C  buPROPion (WELLBUTRIN XL) 150 MG 24 hr tablet Take 1 tablet (150 mg total) by mouth daily. 09/09/20   Dettinger, Fransisca Kaufmann, MD  cetirizine (ZYRTEC ALLERGY) 10 MG tablet Take 1 tablet (10 mg total) by mouth daily. 03/07/21   Jaynee Eagles, PA-C  Crisaborole (EUCRISA) 2 % OINT Apply small amount to area BID. Patient not taking: No sig reported 10/21/20   Hassell Done, Mary-Margaret, FNP  fluticasone (FLONASE) 50 MCG/ACT nasal spray Place 2 sprays into both nostrils daily. Patient not taking: No sig reported 11/27/20   Evelina Dun A, FNP  ipratropium (ATROVENT) 0.03 % nasal spray Place 2 sprays into both nostrils 2 (two) times daily. 03/07/21   Jaynee Eagles, PA-C  norgestimate-ethinyl estradiol (ORTHO-CYCLEN) 0.25-35 MG-MCG tablet TAKE 1 TABLET BY MOUTH EVERY DAY 09/10/20   Dettinger, Fransisca Kaufmann, MD  omeprazole (PRILOSEC) 40 MG capsule TAKE 1 CAPSULE BY MOUTH EVERY DAY 05/31/20   Dettinger, Fransisca Kaufmann, MD  pseudoephedrine (SUDAFED) 30 MG tablet Take 1 tablet (30 mg total) by mouth every 8 (eight) hours as needed  for congestion. 03/07/21   Wallis Bamberg, PA-C  sertraline (ZOLOFT) 100 MG tablet Take 1 tablet (100 mg total) by mouth daily. 01/01/20   Dettinger, [name], MD     Allergies    Patient has no known allergies.   Review of Systems   Review of Systems A comprehensive review of systems was completed and negative except as noted in HPI.    Physical Exam BP 132/85   Pulse 98   Temp 100.2 F (37.9 C) (Oral)   Resp 18   Ht 5\' 9"  (1.753 m)   Wt 122.5 kg   LMP  (LMP Unknown)   SpO2 99%   BMI 39.87 kg/m   Physical Exam Vitals and nursing note reviewed.  Constitutional:      Appearance: Normal appearance.  HENT:     Head: Normocephalic and atraumatic.      Nose: Nose normal.     Mouth/Throat:     Mouth: Mucous membranes are moist.  Eyes:     Extraocular Movements: Extraocular movements intact.     Conjunctiva/sclera: Conjunctivae normal.  Cardiovascular:     Rate and Rhythm: Normal rate.  Pulmonary:     Effort: Pulmonary effort is normal.     Breath sounds: Wheezing present. No rhonchi or rales.  Abdominal:     General: Abdomen is flat.     Palpations: Abdomen is soft.     Tenderness: There is no abdominal tenderness.  Musculoskeletal:        General: No swelling. Normal range of motion.     Cervical back: Neck supple.  Skin:    General: Skin is warm and dry.  Neurological:     General: No focal deficit present.     Mental Status: She is alert.  Psychiatric:        Mood and Affect: Mood normal.     ED Results / Procedures / Treatments   Labs (all labs ordered are listed, but only abnormal results are displayed) Labs Reviewed  RESP PANEL BY RT-PCR (FLU A&B, COVID) ARPGX2 - Abnormal; Notable for the following components:      Result Value   Influenza A by PCR POSITIVE (*)    All other components within normal limits    EKG None  Radiology DG Chest 2 View  Result Date: 03/17/2021 CLINICAL DATA:  Shortness of breath.  Coughing and fever. EXAM: CHEST - 2 VIEW COMPARISON:  PA and lateral 03/07/2021 FINDINGS: The heart size and mediastinal contours are within normal limits. Both lungs are clear. The visualized skeletal structures are unremarkable. IMPRESSION: No active cardiopulmonary disease or interval changes. Electronically Signed   By: 03/09/2021 M.D.   On: 03/17/2021 05:14    Procedures Procedures  Medications Ordered in the ED Medications  predniSONE (DELTASONE) tablet 60 mg (60 mg Oral Given 03/17/21 0525)     MDM Rules/Calculators/A&P MDM  Patient with persistent viral symptoms, low grade fever here with some wheezing. She has asthma and may be having viral bronchitis/asthma exacerbation. Will check for  flu, covid is likely still positive. CXR for signs of pneumonia.  ED Course  I have reviewed the triage vital signs and the nursing notes.  Pertinent labs & imaging results that were available during my care of the patient were reviewed by me and considered in my medical decision making (see chart for details).  Clinical Course as of 03/17/21 0532  Mon Mar 17, 2021  0508 Flu is positive.  [CS]  0531 CXR is clear.  Patient advised to isolate until fever free. Prednisone for wheezing, continue inhaler as needed. Otherwise OTC symptomatic medications.  [CS]    Clinical Course User Index [CS] Truddie Hidden, MD    Final Clinical Impression(s) / ED Diagnoses Final diagnoses:  Influenza A  Viral bronchitis    Rx / DC Orders ED Discharge Orders          Ordered    predniSONE (STERAPRED UNI-PAK 21 TAB) 10 MG (21) TBPK tablet        03/17/21 0532             Truddie Hidden, MD 03/17/21 0532

## 2021-03-17 NOTE — ED Triage Notes (Signed)
Pt had previous covid/rsv in Oct. Recently dx sinus infection. C/o symptoms getting worse and "hard to breathe at times". Pt denies fever. Has cough, nausea and diarrhea. Taking otc meds and antibiotics. No meds last 4-6 hrs.

## 2021-05-23 ENCOUNTER — Telehealth: Payer: Self-pay | Admitting: Family

## 2021-05-23 ENCOUNTER — Other Ambulatory Visit: Payer: Self-pay | Admitting: Family Medicine

## 2021-05-23 DIAGNOSIS — R6889 Other general symptoms and signs: Secondary | ICD-10-CM

## 2021-05-23 MED ORDER — OSELTAMIVIR PHOSPHATE 75 MG PO CAPS: 75.0000 mg | ORAL_CAPSULE | Freq: Two times a day (BID) | ORAL | 0 refills | Status: DC

## 2021-05-23 MED ORDER — BENZONATATE 100 MG PO CAPS: 100.0000 mg | ORAL_CAPSULE | Freq: Three times a day (TID) | ORAL | 0 refills | Status: DC | PRN

## 2021-05-23 NOTE — Progress Notes (Signed)
E visit for Flu like symptoms   We are sorry that you are not feeling well.  Here is how we plan to help! Based on what you have shared with me it looks like you may have a respiratory virus that may be influenza.  Influenza or the flu is   an infection caused by a respiratory virus. The flu virus is highly contagious and persons who did not receive their yearly flu vaccination may catch the flu from close contact.  We have anti-viral medications to treat the viruses that cause this infection. They are not a cure and only shorten the course of the infection. These prescriptions are most effective when they are given within the first 2 days of flu symptoms. Antiviral medication are indicated if you have a high risk of complications from the flu. You should  also consider an antiviral medication if you are in close contact with someone who is at risk. These medications can help patients avoid complications from the flu  but have side effects that you should know. Possible side effects from Tamiflu or oseltamivir include nausea, vomiting, diarrhea, dizziness, headaches, eye redness, sleep problems or other respiratory symptoms. You should not take Tamiflu if you have an allergy to oseltamivir or any to the ingredients in Tamiflu.  Based upon your symptoms and potential risk factors I have prescribed Oseltamivir (Tamiflu).  It has been sent to your designated pharmacy.  You will take one 75 mg capsule orally twice a day for the next 5 days. I have also sent in tessalon for cough you can take three times a day.   ANYONE WHO HAS FLU SYMPTOMS SHOULD: Stay home. The flu is highly contagious and going out or to work exposes others! Be sure to drink plenty of fluids. Water is fine as well as fruit juices, sodas and electrolyte beverages. You may want to stay away from caffeine or alcohol. If you are nauseated, try taking small sips of liquids. How do you know if you are getting enough fluid? Your urine  should be a pale yellow or almost colorless. Get rest. Taking a steamy shower or using a humidifier may help nasal congestion and ease sore throat pain. Using a saline nasal spray works much the same way. Cough drops, hard candies and sore throat lozenges may ease your cough. Line up a caregiver. Have someone check on you regularly.   GET HELP RIGHT AWAY IF: You cannot keep down liquids or your medications. You become short of breath Your fell like you are going to pass out or loose consciousness. Your symptoms persist after you have completed your treatment plan MAKE SURE YOU  Understand these instructions. Will watch your condition. Will get help right away if you are not doing well or get worse.  Your e-visit answers were reviewed by a board certified advanced clinical practitioner to complete your personal care plan.  Depending on the condition, your plan could have included both over the counter or prescription medications.  If there is a problem please reply  once you have received a response from your provider.  Your safety is important to [name].  If you have drug allergies check your prescription carefully.    You can use MyChart to ask questions about todays visit, request a non-urgent call back, or ask for a work or school excuse for 24 hours related to this e-Visit. If it has been greater than 24 hours you will need to follow up with your provider, or enter  a new e-Visit to address those concerns.  You will get an e-mail in the next two days asking about your experience.  I hope that your e-visit has been valuable and will speed your recovery. Thank you for using e-visits.  Approximately 5 minutes was spent documenting and reviewing patient's chart.

## 2021-06-07 ENCOUNTER — Other Ambulatory Visit: Payer: Self-pay | Admitting: Family Medicine

## 2021-06-09 MED ORDER — OMEPRAZOLE 40 MG PO CPDR: 40.0000 mg | DELAYED_RELEASE_CAPSULE | Freq: Two times a day (BID) | ORAL | 0 refills | Status: DC

## 2021-06-09 NOTE — Addendum Note (Signed)
Addended by: Lashun Ramseyer D on: 06/09/2021 12:46 PM ° ° Modules accepted: Orders ° °

## 2021-06-09 NOTE — Telephone Encounter (Signed)
Dettinger. NTBS 30 days given 05/23/21

## 2021-06-09 NOTE — Telephone Encounter (Signed)
Apt scheduled 07/04/2021 Dettinger

## 2021-06-15 ENCOUNTER — Telehealth: Payer: Self-pay | Admitting: Nurse Practitioner

## 2021-06-15 DIAGNOSIS — J029 Acute pharyngitis, unspecified: Secondary | ICD-10-CM

## 2021-06-15 MED ORDER — BENZOCAINE-MENTHOL 6-10 MG MT LOZG: 1.0000 | LOZENGE | OROMUCOSAL | 0 refills | Status: DC | PRN

## 2021-06-15 MED ORDER — CETIRIZINE HCL 10 MG PO TABS: 10.0000 mg | ORAL_TABLET | Freq: Every day | ORAL | 0 refills | Status: DC

## 2021-06-15 MED ORDER — FLUTICASONE PROPIONATE 50 MCG/ACT NA SUSP: 2.0000 | Freq: Every day | NASAL | 6 refills | Status: DC

## 2021-06-15 NOTE — Progress Notes (Signed)
I have spent 5 minutes in review of e-visit questionnaire, review and updating patient chart, medical decision making and response to patient.  ° °Valor [place] W Arminda Foglio, NP ° °  °

## 2021-06-15 NOTE — Progress Notes (Signed)
E-Visit for Sore Throat  We are sorry that you are not feeling well.  Here is how we plan to help!  After careful review of your answers, I would not recommend an antibiotic for your condition.  Antibiotics should not be used to treat conditions that we suspect are caused by viruses like the virus that causes the common cold or flu. However, some people can have Strep with atypical symptoms. You may need formal testing in clinic or office to confirm if your symptoms continue or worsen.   Providers prescribe antibiotics to treat infections caused by bacteria. Antibiotics are very powerful in treating bacterial infections when they are used properly.  To maintain their effectiveness, they should be used only when necessary.  Overuse of antibiotics has resulted in the development of super bugs that are resistant to treatment!    TODAY I HAVE REFILLED YOUR ALLERGY MEDICINE, FLONASE NOSE SPRAY AND PRESCRIBED LOZENGES FOR YOUR THROAT.    Your symptoms indicate a likely viral infection (Pharyngitis).   Pharyngitis is inflammation in the back of the throat which can cause a sore throat, scratchiness and sometimes difficulty swallowing.   Pharyngitis is typically caused by a respiratory virus and will just run its course.  Please keep in mind that your symptoms could last up to 10 days.  For throat pain, we recommend over the counter oral pain relief medications such as acetaminophen or aspirin, or anti-inflammatory medications such as ibuprofen or naproxen sodium.  Topical treatments such as oral throat lozenges or sprays may be used as needed.  Avoid close contact with loved ones, especially the very young and elderly.  Remember to wash your hands thoroughly throughout the day as this is the number one way to prevent the spread of infection and wipe down door knobs and counters with disinfectant.    Home Care: Only take medications as instructed by your medical team. Do not drink alcohol while taking  these medications. A steam or ultrasonic humidifier can help congestion.  You can place a towel over your head and breathe in the steam from hot water coming from a faucet. Avoid close contacts especially the very young and the elderly. Cover your mouth when you cough or sneeze. Always remember to wash your hands.  Get Help Right Away If: You develop worsening fever or throat pain. You develop a severe head ache or visual changes. Your symptoms persist after you have completed your treatment plan.  Make sure you Understand these instructions. Will watch your condition. Will get help right away if you are not doing well or get worse.   Thank you for choosing an e-visit.  Your e-visit answers were reviewed by a board certified advanced clinical practitioner to complete your personal care plan. Depending upon the condition, your plan could have included both over the counter or prescription medications.  Please review your pharmacy choice. Make sure the pharmacy is open so you can pick up prescription now. If there is a problem, you may contact your provider through [employer] and have the prescription routed to another pharmacy.  Your safety is important to [name]. If you have drug allergies check your prescription carefully.   For the next 24 hours you can use MyChart to ask questions about today's visit, request a non-urgent call back, or ask for a work or school excuse. You will get an email in the next two days asking about your experience. I hope that your e-visit has been valuable and will speed your  recovery.

## 2021-06-16 ENCOUNTER — Telehealth: Payer: Self-pay | Admitting: Family Medicine

## 2021-06-16 NOTE — Telephone Encounter (Signed)
°  Prescription Request  06/16/2021  Is this a "Controlled Substance" medicine? no  Have you seen your PCP in the last 2 weeks? no  If YES, route message to pool  -  If NO, patient needs to be scheduled for appointment.  What is the name of the medication or equipment? omeprazole (PRILOSEC) 40 MG capsule  Have you contacted your pharmacy to request a refill? yes   Which pharmacy would you like this sent to? [facility] in [place]    Patient notified that their request is being sent to the clinical staff for review and that they should receive a response within 2 business days.

## 2021-06-16 NOTE — Telephone Encounter (Signed)
Informed pt that a 30 day supply was sent in on 06/09/21 when the front office made her an appt. [facility]

## 2021-06-17 ENCOUNTER — Other Ambulatory Visit: Payer: Self-pay | Admitting: Nurse Practitioner

## 2021-06-17 DIAGNOSIS — J029 Acute pharyngitis, unspecified: Secondary | ICD-10-CM

## 2021-06-17 NOTE — Telephone Encounter (Signed)
° °  Notes to clinic:  Pharm request as follows: Pharmacy comment: REQUEST FOR 90 DAYS PRESCRIPTION.  Requested Prescriptions  Pending Prescriptions Disp Refills   cetirizine (ZYRTEC) 10 MG tablet [Pharmacy Med Name: CETIRIZINE HCL 10 MG TABLET] 90 tablet 1    Sig: TAKE 1 TABLET BY MOUTH EVERY DAY     Ear, Nose, and Throat:  Antihistamines 2 Failed - 06/17/2021  1:46 PM      Failed - Valid encounter within last 12 months    Recent Outpatient Visits   None     Future Appointments             In 2 weeks Dettinger, [name], MD [facility]            Passed - Cr in normal range and within 360 days    Creat  Date Value Ref Range Status  07/23/2016 0.64 0.50 - 1.10 mg/dL Final   Creatinine, Ser  Date Value Ref Range Status  07/25/2020 0.87 0.57 - 1.00 mg/dL Final

## 2021-06-19 ENCOUNTER — Telehealth: Payer: Self-pay | Admitting: Family Medicine

## 2021-06-19 DIAGNOSIS — R300 Dysuria: Secondary | ICD-10-CM

## 2021-06-20 NOTE — Progress Notes (Signed)
[name]   Needs to resubmit due to clarification needs

## 2021-06-27 ENCOUNTER — Telehealth: Payer: 59 | Admitting: Emergency Medicine

## 2021-06-27 DIAGNOSIS — H10029 Other mucopurulent conjunctivitis, unspecified eye: Secondary | ICD-10-CM | POA: Diagnosis not present

## 2021-06-27 MED ORDER — POLYMYXIN B-TRIMETHOPRIM 10000-0.1 UNIT/ML-% OP SOLN: 1.0000 [drp] | OPHTHALMIC | 0 refills | Status: AC

## 2021-06-27 NOTE — Progress Notes (Signed)

## 2021-07-02 ENCOUNTER — Encounter: Payer: Self-pay | Admitting: Family Medicine

## 2021-07-02 ENCOUNTER — Ambulatory Visit (INDEPENDENT_AMBULATORY_CARE_PROVIDER_SITE_OTHER): Payer: 59 | Admitting: Family Medicine

## 2021-07-02 ENCOUNTER — Other Ambulatory Visit: Payer: Self-pay | Admitting: Family Medicine

## 2021-07-02 VITALS — BP 134/83 | HR 89 | Ht 69.0 in | Wt 281.0 lb

## 2021-07-02 DIAGNOSIS — H1031 Unspecified acute conjunctivitis, right eye: Secondary | ICD-10-CM

## 2021-07-02 MED ORDER — AZITHROMYCIN 1 % OP SOLN: OPHTHALMIC | 0 refills | Status: AC

## 2021-07-02 MED ORDER — OLOPATADINE HCL 0.1 % OP SOLN: 1.0000 [drp] | Freq: Two times a day (BID) | OPHTHALMIC | 1 refills | Status: DC

## 2021-07-02 NOTE — Progress Notes (Signed)
BP 134/83    Pulse 89    Ht 5\' 9"  (1.753 m)    Wt 281 lb (127.5 kg)    SpO2 97%    BMI 41.50 kg/m    Subjective:   Patient ID: Amy Nichols, female    DOB: 1996/01/10, 26 y.o.   MRN: [id number]  HPI: Amy Nichols is a 26 y.o. female presenting on 07/02/2021 for Conjunctivitis (Follow up)   HPI Patient's anxiety and depression has been increased.  She did not tolerate the Wellbutrin because it made her more emotional I would like to go back to the Zoloft.  She says she still has some but wants to know if she started the higher dose or if she should cut in half.  Recommended that she cut it in half and taper herself up.  She denies any suicidal ideations or thoughts of hurting self.  Patient is coming in today for right eye conjunctivitis.  She says she has been on the antibiotic drops that she has been on for 6 days and she is still woke up this morning matted and draining.  She was on this for see the antibiotic for 5 days but has continued it because she still having issues.  She denies any visual issues or fevers or chills.  She does have a little bit of nasal congestion and sneezing.  Relevant past medical, surgical, family and social history reviewed and updated as indicated. Interim medical history since our last visit reviewed. Allergies and medications reviewed and updated.  Review of Systems  Constitutional:  Negative for chills and fever.  HENT:  Positive for congestion, postnasal drip, rhinorrhea and sneezing. Negative for ear discharge, ear pain, sinus pressure, sinus pain and sore throat.   Eyes:  Positive for redness and itching. Negative for photophobia, pain and visual disturbance.  Respiratory:  Negative for chest tightness and shortness of breath.   Cardiovascular:  Negative for chest pain and leg swelling.  Genitourinary:  Negative for difficulty urinating and dysuria.  Musculoskeletal:  Negative for back pain and gait problem.  Skin:  Negative for rash.   Neurological:  Negative for light-headedness and headaches.  Psychiatric/Behavioral:  Positive for dysphoric mood. Negative for agitation, behavioral problems and decreased concentration. The patient is nervous/anxious.   All other systems reviewed and are negative.  Per HPI unless specifically indicated above   Allergies as of 07/02/2021   No Known Allergies      Medication List        Accurate as of July 02, 2021  9:53 AM. If you have any questions, ask your nurse or doctor.          STOP taking these medications    amoxicillin 875 MG tablet Commonly known as: AMOXIL   benzocaine-menthol 6-10 MG lozenge Commonly known as: CHLORAEPTIC   benzonatate 100 MG capsule Commonly known as: Tessalon Perles   buPROPion 150 MG 24 hr tablet Commonly known as: WELLBUTRIN XL   norgestimate-ethinyl estradiol 0.25-35 MG-MCG tablet Commonly known as: ORTHO-CYCLEN   oseltamivir 75 MG capsule Commonly known as: Tamiflu   predniSONE 10 MG (21) Tbpk tablet Commonly known as: STERAPRED UNI-PAK 21 TAB   sertraline 100 MG tablet Commonly known as: ZOLOFT       TAKE these medications    albuterol 108 (90 Base) MCG/ACT inhaler Commonly known as: VENTOLIN HFA Inhale 2 puffs into the lungs every 6 (six) hours as needed for wheezing or shortness of breath.   azithromycin 1 %  ophthalmic solution Commonly known as: AZASITE Place 1 drop into the right eye 2 (two) times daily for 2 days, THEN 1 drop daily for 5 days. Start taking on: July 02, 2021   cetirizine 10 MG tablet Commonly known as: ZyrTEC Allergy Take 1 tablet (10 mg total) by mouth daily.   Eucrisa 2 % Oint Generic drug: Crisaborole Apply small amount to area BID.   fluticasone 50 MCG/ACT nasal spray Commonly known as: FLONASE Place 2 sprays into both nostrils daily.   ipratropium 0.03 % nasal spray Commonly known as: ATROVENT Place 2 sprays into both nostrils 2 (two) times daily.   KYLEENA IU by  Intrauterine route.   olopatadine 0.1 % ophthalmic solution Commonly known as: Patanol Place 1 drop into the right eye 2 (two) times daily.   omeprazole 40 MG capsule Commonly known as: PRILOSEC Take 1 capsule (40 mg total) by mouth 2 (two) times daily before a meal.   pseudoephedrine 30 MG tablet Commonly known as: SUDAFED Take 1 tablet (30 mg total) by mouth every 8 (eight) hours as needed for congestion.   trimethoprim-polymyxin b ophthalmic solution Commonly known as: Polytrim Place 1 drop into both eyes every 4 (four) hours for 5 days.         Objective:   BP 134/83    Pulse 89    Ht 5\' 9"  (1.753 m)    Wt 281 lb (127.5 kg)    SpO2 97%    BMI 41.50 kg/m   Wt Readings from Last 3 Encounters:  07/02/21 281 lb (127.5 kg)  03/17/21 270 lb (122.5 kg)  12/20/20 273 lb 12.8 oz (124.2 kg)    Physical Exam Vitals and nursing note reviewed.  Constitutional:      General: She is not in acute distress.    Appearance: She is well-developed. She is not diaphoretic.  Eyes:     Conjunctiva/sclera:     Right eye: Right conjunctiva is injected. No exudate or hemorrhage.    Left eye: Left conjunctiva is not injected. No exudate or hemorrhage. Skin:    General: Skin is warm and dry.     Findings: No rash.  Neurological:     Mental Status: She is alert and oriented to person, place, and time.     Coordination: Coordination normal.  Psychiatric:        Mood and Affect: Mood is anxious and depressed.        Behavior: Behavior normal.        Thought Content: Thought content does not include suicidal ideation. Thought content does not include suicidal plan.      Assessment & Plan:   Problem List Items Addressed This Visit   None Visit Diagnoses     Acute conjunctivitis of right eye, unspecified acute conjunctivitis type    -  Primary   Relevant Medications   azithromycin (AZASITE) 1 % ophthalmic solution   olopatadine (PATANOL) 0.1 % ophthalmic solution       Patient  will restart her Zoloft I cut it in half at first at 50 mg and increase to 100 mg for anxiety and depression  We will switch antibiotic drops to see if we can help clear the conjunctivitis better Follow up plan: Return if symptoms worsen or fail to improve, for 1 to 66-month recheck on anxiety and depression.  Counseling provided for all of the vaccine components No orders of the defined types were placed in this encounter.   Caryl Pina, MD [facility] 07/02/2021, 9:53 AM

## 2021-07-04 ENCOUNTER — Ambulatory Visit: Payer: Self-pay | Admitting: Family Medicine

## 2021-07-08 ENCOUNTER — Other Ambulatory Visit: Payer: Self-pay | Admitting: Family Medicine

## 2021-07-08 NOTE — Telephone Encounter (Signed)
Dettinger,  You seen pt on 2/22. She does have a follow up 4/21 booked.  Are you ok to send referral or does she need to discuss in clinic first?

## 2021-07-08 NOTE — Telephone Encounter (Signed)
Pt called stating that she just had a visit with her PCP about her anxiety and forgot to mention to PCP that she needs a referral sent over to [facility] to continue care there for her anxiety.  Needs referral faxed to [phone]

## 2021-07-09 ENCOUNTER — Other Ambulatory Visit: Payer: Self-pay | Admitting: Family Medicine

## 2021-07-09 DIAGNOSIS — F419 Anxiety disorder, unspecified: Secondary | ICD-10-CM

## 2021-07-09 MED ORDER — SERTRALINE HCL 50 MG PO TABS: 50.0000 mg | ORAL_TABLET | Freq: Every day | ORAL | 3 refills | Status: DC

## 2021-07-09 NOTE — Telephone Encounter (Signed)
Unfortunately right now, she is living in [place] unless she is still seeing them and they are seeing her now [place] [facility] has made a change and they will not see people outside of [facility] for the main [place] and the one in [place] is not excepting adults right now.  If there is a another behavioral health location she would like to go to I am more than happy to place referral.  If she is still seeing them and they just need a renewal of referral then we can do that as well. ?

## 2021-07-09 NOTE — Progress Notes (Unsigned)
Placed referral to behavioral health for the patient 

## 2021-07-09 NOTE — Telephone Encounter (Signed)
Pts states that she called Behavioral Health with [facility] yesterday and was told that they would see her. ? ?She would like to place the referral. ? ?Pt not sure why Zoloft 50mg  is not on her list but she does need a refill ?

## 2021-07-15 ENCOUNTER — Other Ambulatory Visit: Payer: Self-pay | Admitting: Family Medicine

## 2021-07-15 DIAGNOSIS — H1031 Unspecified acute conjunctivitis, right eye: Secondary | ICD-10-CM

## 2021-08-14 ENCOUNTER — Ambulatory Visit (HOSPITAL_COMMUNITY): Payer: Self-pay | Admitting: Clinical

## 2021-08-26 ENCOUNTER — Other Ambulatory Visit: Payer: Self-pay

## 2021-08-26 ENCOUNTER — Telehealth: Payer: Self-pay | Admitting: Family Medicine

## 2021-08-26 ENCOUNTER — Telehealth: Payer: 59 | Admitting: Emergency Medicine

## 2021-08-26 DIAGNOSIS — J069 Acute upper respiratory infection, unspecified: Secondary | ICD-10-CM | POA: Diagnosis not present

## 2021-08-26 DIAGNOSIS — Z20822 Contact with and (suspected) exposure to covid-19: Secondary | ICD-10-CM

## 2021-08-26 MED ORDER — SERTRALINE HCL 100 MG PO TABS: 100.0000 mg | ORAL_TABLET | Freq: Every day | ORAL | 1 refills | Status: DC

## 2021-08-26 NOTE — Telephone Encounter (Signed)
Per Dr. Merita Norton last [place] note pt was to taper up to 100mg  of Zoloft. Confirmed with pt that she is feeling well on the 100mg  and has been taking that strength since about two weeks after last appt with Dr. Warrick Parisian. [facility] [place]. Pt aware of that. She would also like to discuss her ADHD. States that it has been " way up" since her appt. She did not like the Wellbutrin. Pt has an appt on 4/21 with Dettinger and agreed to wait until then to discuss ADHD. ?

## 2021-08-26 NOTE — Progress Notes (Signed)
E-Visit for Upper Respiratory Infection  ? ?We are sorry you are not feeling well.  Here is how we plan to help! ? ?Based on what you have shared with me, it looks like you may have a viral upper respiratory infection.  Upper respiratory infections are caused by a large number of viruses; however, rhinovirus is the most common cause. I advise you to get tested for COVID as it can cause symptoms like yours.  ? ?Symptoms vary from person to person, with common symptoms including sore throat, cough, fatigue or lack of energy and feeling of general discomfort.  A low-grade fever of up to 100.4 may present, but is often uncommon.  Symptoms vary however, and are closely related to a person's age or underlying illnesses.  The most common symptoms associated with an upper respiratory infection are nasal discharge or congestion, cough, sneezing, headache and pressure in the ears and face.  These symptoms usually persist for about 3 to 10 days, but can last up to 2 weeks.  It is important to know that upper respiratory infections do not cause serious illness or complications in most cases.   ? ?Upper respiratory infections can be transmitted from person to person, with the most common method of transmission being a person's hands.  The virus is able to live on the skin and can infect other persons for up to 2 hours after direct contact.  Also, these can be transmitted when someone coughs or sneezes; thus, it is important to cover the mouth to reduce this risk.  To keep the spread of the illness at [place], good hand hygiene is very important. ? ?This is an infection that is most likely caused by a virus. There are no specific treatments other than to help you with the symptoms until the infection runs its course.  We are sorry you are not feeling well.  Here is how we plan to help! ? ? ?For nasal congestion, you may use an oral decongestants such as Mucinex D or if you have glaucoma or high blood pressure use plain Mucinex.   Saline nasal spray or nasal drops can help and can safely be used as often as needed for congestion.  For your congestion, I recommend you continue using your flonase nasal spray and ipratropium nasal spray. If you are out of either, let me know and I can refill them.  ? ?If you do not have a history of heart disease, hypertension, diabetes or thyroid disease, prostate/bladder issues or glaucoma, you may also use Sudafed to treat nasal congestion.  It is highly recommended that you consult with a pharmacist or your primary care physician to ensure this medication is safe for you to take.    ? ?If you have a cough, you may use cough suppressants such as Delsym and Robitussin.  If you have glaucoma or high blood pressure, you can also use Coricidin HBP.   ?If you have a cough I recommend Delsym cough syrup, available over the counter.  ? ?If you have a sore or scratchy throat, use a saltwater gargle- ? to ? teaspoon of salt dissolved in a 4-ounce to 8-ounce glass of warm water.  Gargle the solution for approximately 15-30 seconds and then spit.  It is important not to swallow the solution.  You can also use throat lozenges/cough drops and Chloraseptic spray to help with throat pain or discomfort.  Warm or cold liquids can also be helpful in relieving throat pain. Your flonase and ipratropium  nasal sprays will also help relieve your sore throat if it is due to drainage down your throat.  ? ?For headache, pain or general discomfort, you can use Ibuprofen or Tylenol as directed.   ?Some authorities believe that zinc sprays or the use of Echinacea may shorten the course of your symptoms. ? ? ?HOME CARE ?Only take medications as instructed by your medical team. ?Be sure to drink plenty of fluids. Water is fine as well as fruit juices, sodas and electrolyte beverages. You may want to stay away from caffeine or alcohol. If you are nauseated, try taking small sips of liquids. How do you know if you are getting enough fluid?  Your urine should be a pale yellow or almost colorless. ?Get rest. ?Taking a steamy shower or using a humidifier may help nasal congestion and ease sore throat pain. You can place a towel over your head and breathe in the steam from hot water coming from a faucet. ?Using a saline nasal spray works much the same way. ?Cough drops, hard candies and sore throat lozenges may ease your cough. ?Avoid close contacts especially the very young and the elderly ?Cover your mouth if you cough or sneeze ?Always remember to wash your hands.  ? ?GET HELP RIGHT AWAY IF: ?You develop worsening fever. ?If your symptoms do not improve within 10 days ?You develop yellow or green discharge from your nose over 3 days. ?You have coughing fits ?You develop a severe head ache or visual changes. ?You develop shortness of breath, difficulty breathing or start having chest pain ?Your symptoms persist after you have completed your treatment plan ? ?MAKE SURE YOU  ?Understand these instructions. ?Will watch your condition. ?Will get help right away if you are not doing well or get worse. ? ?Thank you for choosing an e-visit. ? ?Your e-visit answers were reviewed by a board certified advanced clinical practitioner to complete your personal care plan. Depending upon the condition, your plan could have included both over the counter or prescription medications. ? ?Please review your pharmacy choice. Make sure the pharmacy is open so you can pick up prescription now. If there is a problem, you may contact your provider through [employer] and have the prescription routed to another pharmacy.  Your safety is important to [name]. If you have drug allergies check your prescription carefully.  ? ?For the next 24 hours you can use MyChart to ask questions about today's visit, request a non-urgent call back, or ask for a work or school excuse. ?You will get an email in the next two days asking about your experience. I hope that your e-visit has been  valuable and will speed your recovery. ? ? ?I have spent 5 minutes in review of e-visit questionnaire, review and updating patient chart, medical decision making and response to patient.  ? ?Willeen Cass, PhD, FNP-BC ?  ? ? ?

## 2021-08-28 ENCOUNTER — Telehealth: Payer: Self-pay

## 2021-08-28 DIAGNOSIS — R509 Fever, unspecified: Secondary | ICD-10-CM

## 2021-08-28 DIAGNOSIS — R0981 Nasal congestion: Secondary | ICD-10-CM

## 2021-08-28 NOTE — Telephone Encounter (Signed)
Pt states that she has a follow up tomorrow but is sick. She is congestions, has high fever and does not feel well. Partner is flu positive. Should she keep follow up tomorrow for anxiety and depression? Would like to be tested for flu/covid ?

## 2021-08-28 NOTE — Telephone Encounter (Signed)
I am fine with her making the appointment a video visit or virtual visit and just talking about the depression or if she wants to come in and be tested then I am fine with that as well, it is completely up to her ?

## 2021-08-28 NOTE — Addendum Note (Signed)
Addended by: Dorene Sorrow on: 08/28/2021 03:37 PM ? ? Modules accepted: Orders ? ?

## 2021-08-28 NOTE — Telephone Encounter (Signed)
Left message for pt to call back . If she wants we can make a telephone visit for her appt tomorrow. She can come by and be swabbed out front if she wishes. Went ahead a placed future orders for flu and covid. ?

## 2021-08-29 ENCOUNTER — Telehealth (INDEPENDENT_AMBULATORY_CARE_PROVIDER_SITE_OTHER): Payer: 59 | Admitting: Family Medicine

## 2021-08-29 ENCOUNTER — Encounter: Payer: Self-pay | Admitting: Family Medicine

## 2021-08-29 ENCOUNTER — Telehealth: Payer: Self-pay | Admitting: Family Medicine

## 2021-08-29 DIAGNOSIS — F5104 Psychophysiologic insomnia: Secondary | ICD-10-CM | POA: Diagnosis not present

## 2021-08-29 DIAGNOSIS — F419 Anxiety disorder, unspecified: Secondary | ICD-10-CM | POA: Diagnosis not present

## 2021-08-29 DIAGNOSIS — Z20828 Contact with and (suspected) exposure to other viral communicable diseases: Secondary | ICD-10-CM

## 2021-08-29 DIAGNOSIS — R6889 Other general symptoms and signs: Secondary | ICD-10-CM

## 2021-08-29 MED ORDER — OSELTAMIVIR PHOSPHATE 75 MG PO CAPS: 75.0000 mg | ORAL_CAPSULE | Freq: Two times a day (BID) | ORAL | 0 refills | Status: AC

## 2021-08-29 MED ORDER — TRAZODONE HCL 50 MG PO TABS: 25.0000 mg | ORAL_TABLET | Freq: Every evening | ORAL | 3 refills | Status: DC | PRN

## 2021-08-29 NOTE — Telephone Encounter (Signed)
Patient seen today 4/21 and needs note for work sent to her MyChart.  Needs to know if she is okay to return to work on Monday. ?

## 2021-08-29 NOTE — Progress Notes (Signed)
? ?Virtual Visit via MyChart video note ? ?I connected with Amy Nichols on 08/29/21 at 1502 by video and verified that I am speaking with the correct person using two identifiers. Amy Nichols is currently located at home and patient are currently with her during visit. The provider, [name] Keary Hanak, MD is located in their office at time of visit. ? ?Call ended at 1515 ? ?I discussed the limitations, risks, security and privacy concerns of performing an evaluation and management service by video and the availability of in person appointments. I also discussed with the patient that there may be a patient responsible charge related to this service. The patient expressed understanding and agreed to proceed. ? ? ?History and Present Illness: ?Anxiety recheck ?She is doing better until the past couple days. She has been doing well and is not sleeping at night but she is tossing and turning and not getting good rest. Her appetite is ok but she doesn't have as much of an appetite. She has tried tylenol PM for sleep.  ? ?She has been exposed to the flu in her house from he girlfriend nad he GFs daughter and she started having these symptoms of congestion and sneezing and coughing and runny nose and fever and chills. For 24 hours she has these symptoms ? ?1. Anxiety   ?2. Exposure to influenza   ?3. Psychophysiological insomnia   ?4. Flu-like symptoms   ? ? ?Outpatient Encounter Medications as of 08/29/2021  ?Medication Sig  ? oseltamivir (TAMIFLU) 75 MG capsule Take 1 capsule (75 mg total) by mouth 2 (two) times daily for 5 days.  ? traZODone (DESYREL) 50 MG tablet Take 0.5-1 tablets (25-50 mg total) by mouth at bedtime as needed for sleep.  ? albuterol (VENTOLIN HFA) 108 (90 Base) MCG/ACT inhaler Inhale 2 puffs into the lungs every 6 (six) hours as needed for wheezing or shortness of breath.  ? cetirizine (ZYRTEC ALLERGY) 10 MG tablet Take 1 tablet (10 mg total) by mouth daily.  ? Crisaborole (EUCRISA) 2 % OINT  Apply small amount to area BID.  ? fluticasone (FLONASE) 50 MCG/ACT nasal spray Place 2 sprays into both nostrils daily.  ? ipratropium (ATROVENT) 0.03 % nasal spray Place 2 sprays into both nostrils 2 (two) times daily.  ? Levonorgestrel (KYLEENA IU) by Intrauterine route.  ? olopatadine (PATANOL) 0.1 % ophthalmic solution Place 1 drop into the right eye 2 (two) times daily.  ? omeprazole (PRILOSEC) 40 MG capsule Take 1 capsule (40 mg total) by mouth 2 (two) times daily before a meal.  ? pseudoephedrine (SUDAFED) 30 MG tablet Take 1 tablet (30 mg total) by mouth every 8 (eight) hours as needed for congestion.  ? sertraline (ZOLOFT) 100 MG tablet Take 1 tablet (100 mg total) by mouth daily.  ? ?No facility-administered encounter medications on file as of 08/29/2021.  ? ? ?Review of Systems  ?Constitutional:  Positive for chills and fever.  ?HENT:  Positive for congestion, postnasal drip, rhinorrhea, sinus pressure, sneezing and sore throat. Negative for ear discharge and ear pain.   ?Eyes:  Negative for pain, redness and visual disturbance.  ?Respiratory:  Positive for cough. Negative for chest tightness and shortness of breath.   ?Cardiovascular:  Negative for chest pain and leg swelling.  ?Genitourinary:  Negative for difficulty urinating and dysuria.  ?Musculoskeletal:  Positive for myalgias. Negative for back pain and gait problem.  ?Skin:  Negative for rash.  ?Neurological:  Negative for light-headedness and headaches.  ?  Psychiatric/Behavioral:  Negative for agitation and behavioral problems.   ?All other systems reviewed and are negative. ? ?Observations/Objective: ?Patient sounds comfortable and in no acute distress ? ?Assessment and Plan: ?Problem List Items Addressed This Visit   ?None ?Visit Diagnoses   ? ? Anxiety    -  Primary  ? Relevant Medications  ? traZODone (DESYREL) 50 MG tablet  ? Exposure to influenza      ? Relevant Medications  ? oseltamivir (TAMIFLU) 75 MG capsule  ? Psychophysiological  insomnia      ? Relevant Medications  ? traZODone (DESYREL) 50 MG tablet  ? Flu-like symptoms      ? ?  ?  ?Doing okay on her anxiety med, not sleeping as well, will try trazodone in the evening to help with it. ? ?Having symptoms and exposed to flu, will treat with Tamiflu. ?Follow up plan: ?Return in about 3 months (around 11/28/2021), or if symptoms worsen or fail to improve, for Anxiety recheck. ? ? ?  ?I discussed the assessment and treatment plan with the patient. The patient was provided an opportunity to ask questions and all were answered. The patient agreed with the plan and demonstrated an understanding of the instructions. ?  ?The patient was advised to call back or seek an in-person evaluation if the symptoms worsen or if the condition fails to improve as anticipated. ? ?The above assessment and management plan was discussed with the patient. The patient verbalized understanding of and has agreed to the management plan. Patient is aware to call the clinic if symptoms persist or worsen. Patient is aware when to return to the clinic for a follow-up visit. Patient educated on when it is appropriate to go to the emergency department.  ? ? ?I provided 13 minutes of non-face-to-face time during this encounter. ? ? ? ?Nils Pyle, MD ?   ?

## 2021-09-01 NOTE — Telephone Encounter (Signed)
Attempted to contact - NA ? ?Has patient been fever free for 24 hours? ? ?

## 2021-09-01 NOTE — Telephone Encounter (Signed)
Work note created with Dettinger's instructions. Pt states that she has been 24 hours free of fever. She does have some nasal congestion. Advised pt to continue Mucinex DM, nasal spray. Also advised that she could at a allergy medication at night and increase her water intake. Mychart work note sent. ?

## 2021-09-01 NOTE — Telephone Encounter (Signed)
She may return to work as long as she is 24 hours fever free.  I am okay with doing note for her to return to work when she is 24 hours fever free ?

## 2021-09-12 ENCOUNTER — Other Ambulatory Visit: Payer: Self-pay | Admitting: Family Medicine

## 2021-09-12 DIAGNOSIS — F5104 Psychophysiologic insomnia: Secondary | ICD-10-CM

## 2021-09-12 DIAGNOSIS — F419 Anxiety disorder, unspecified: Secondary | ICD-10-CM

## 2021-09-15 ENCOUNTER — Encounter: Payer: Self-pay | Admitting: Family Medicine

## 2021-09-16 ENCOUNTER — Ambulatory Visit (INDEPENDENT_AMBULATORY_CARE_PROVIDER_SITE_OTHER): Payer: 59

## 2021-09-16 ENCOUNTER — Encounter: Payer: Self-pay | Admitting: Emergency Medicine

## 2021-09-16 ENCOUNTER — Ambulatory Visit
Admission: EM | Admit: 2021-09-16 | Discharge: 2021-09-16 | Disposition: A | Discharge status: Home / Self Care | Payer: 59 | Attending: Nurse Practitioner | Admitting: Nurse Practitioner

## 2021-09-16 DIAGNOSIS — M79642 Pain in left hand: Secondary | ICD-10-CM | POA: Diagnosis not present

## 2021-09-16 MED ORDER — IBUPROFEN 800 MG PO TABS: 800.0000 mg | ORAL_TABLET | Freq: Three times a day (TID) | ORAL | 0 refills | Status: DC | PRN

## 2021-09-16 NOTE — Discharge Instructions (Addendum)
Your x-rays are negative today. ?As discussed, if your symptoms continue to persist, you will need to follow-up with a hand specialist. ?Gentle range of motion exercises to keep the hand joint mobile. ?Take medication as prescribed. ?Wear the brace provided to help with strenuous activity and to provide additional support. ?May apply ice to the affected area, on for 20 minutes, off for 1 hour, then repeat as needed. ?I am providing the information for you for Ortho care at [facility].  You can also contact [facility] at [phone] for further evaluation if needed. ?

## 2021-09-16 NOTE — ED Provider Notes (Signed)
?[facility] ? ? ? ?CSN: [id number] ?Arrival date & time: 09/16/21  0818 ? ? ?  ? ?History   ?Chief Complaint ?No chief complaint on file. ? ? ?HPI ?Amy Nichols is a 26 y.o. female.  ? ?The patient is a 26 year old female who presents with left hand pain.  Symptoms have been present for the past week.  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  She has been her current job for the past 3 months.  She denies any known injury or trauma at this time.  She states that the pain starts in her thumb and radiates into the wrist.  He also notes pain with certain movement of the thumb and wrist.  She denies numbness, or tingling.  She has been taking ibuprofen and Tylenol for her symptoms. ? ?The history is provided by the patient.  ? ?Past Medical History:  ?Diagnosis Date  ? Allergies   ? GERD (gastroesophageal reflux disease)   ? Hypertension   ? no meds at this time  ? ? ?Patient Active Problem List  ? Diagnosis Date Noted  ? Hypertriglyceridemia 04/08/2018  ? GERD without esophagitis 07/23/2016  ? Essential hypertension 10/03/2015  ? Morbid obesity (HCC) 10/03/2015  ? ? ?Past Surgical History:  ?Procedure Laterality Date  ? tear duct probe    ? TONSILLECTOMY AND ADENOIDECTOMY Bilateral 01/23/2020  ? Procedure: TONSILLECTOMY AND ADENOIDECTOMY;  Surgeon: Newman Pies, MD;  Location: [facility];  Service: ENT;  Laterality: Bilateral;  ? ? ?OB History   ?No obstetric history on file. ?  ? ? ? ?Home Medications   ? ?Prior to Admission medications   ?Medication Sig Start Date End Date Taking? Authorizing Provider  ?ibuprofen (ADVIL) 800 MG tablet Take 1 tablet (800 mg total) by mouth every 8 (eight) hours as needed. 09/16/21  Yes Presly Steinruck-Warren, Sadie Haber, NP  ?albuterol (VENTOLIN HFA) 108 (90 Base) MCG/ACT inhaler Inhale 2 puffs into the lungs every 6 (six) hours as needed for wheezing or shortness of breath. 01/13/21   Margaretann Loveless, PA-C  ?cetirizine (ZYRTEC ALLERGY)  10 MG tablet Take 1 tablet (10 mg total) by mouth daily. 06/15/21   Claiborne Rigg, NP  ?Crisaborole (EUCRISA) 2 % OINT Apply small amount to area BID. 10/21/20   Bennie Pierini, FNP  ?fluticasone (FLONASE) 50 MCG/ACT nasal spray Place 2 sprays into both nostrils daily. 06/15/21   Claiborne Rigg, NP  ?ipratropium (ATROVENT) 0.03 % nasal spray Place 2 sprays into both nostrils 2 (two) times daily. 03/07/21   Wallis Bamberg, PA-C  ?Levonorgestrel (KYLEENA IU) by Intrauterine route.    [provider]  ?olopatadine (PATANOL) 0.1 % ophthalmic solution Place 1 drop into the right eye 2 (two) times daily. 07/02/21   Dettinger, [name], MD  ?omeprazole (PRILOSEC) 40 MG capsule Take 1 capsule (40 mg total) by mouth 2 (two) times daily before a meal. 06/09/21   Dettinger, [name], MD  ?pseudoephedrine (SUDAFED) 30 MG tablet Take 1 tablet (30 mg total) by mouth every 8 (eight) hours as needed for congestion. 03/07/21   Wallis Bamberg, PA-C  ?sertraline (ZOLOFT) 100 MG tablet Take 1 tablet (100 mg total) by mouth daily. 08/26/21   Dettinger, [name], MD  ?traZODone (DESYREL) 50 MG tablet TAKE 0.5-1 TABLETS BY MOUTH AT BEDTIME AS NEEDED FOR SLEEP. 09/15/21   Dettinger, [name], MD  ? ? ?Family History ?Family History  ?Problem Relation  Age of Onset  ? Asthma Mother   ? Hypertension Mother   ? Asthma Father   ? Hypertension Father   ? COPD Maternal Grandmother   ? COPD Maternal Grandfather   ? Hypertension Paternal Grandmother   ? Hypertension Paternal Grandfather   ? ? ?Social History ?Social History  ? ?Tobacco Use  ? Smoking status: Never  ? Smokeless tobacco: Never  ?Vaping Use  ? Vaping Use: Never used  ?Substance Use Topics  ? Alcohol use: No  ? Drug use: No  ? ? ? ?Allergies   ?Patient has no known allergies. ? ? ?Review of Systems ?Review of Systems  ?Constitutional: Negative.   ?Musculoskeletal:  Negative for joint swelling.  ?     Left hand pain  ?Skin: Negative.   ?Psychiatric/Behavioral: Negative.     ? ? ?Physical Exam ?Triage Vital Signs ?ED Triage Vitals  ?Enc Vitals Group  ?   BP 09/16/21 0830 132/88  ?   Pulse Rate 09/16/21 0830 77  ?   Resp 09/16/21 0830 18  ?   Temp 09/16/21 0830 98.5 ?F (36.9 ?C)  ?   Temp Source 09/16/21 0830 Oral  ?   SpO2 09/16/21 0830 97 %  ?   Weight --   ?   Height --   ?   Head Circumference --   ?   Peak Flow --   ?   Pain Score 09/16/21 0832 4  ?   Pain Loc --   ?   Pain Edu? --   ?   Excl. in GC? --   ? ?No data found. ? ?Updated Vital Signs ?BP 132/88 (BP Location: Right Arm)   Pulse 77   Temp 98.5 ?F (36.9 ?C) (Oral)   Resp 18   SpO2 97%  ? ?Visual Acuity ?Right Eye Distance:   ?Left Eye Distance:   ?Bilateral Distance:   ? ?Right Eye Near:   ?Left Eye Near:    ?Bilateral Near:    ? ?Physical Exam ?Vitals reviewed.  ?Constitutional:   ?   Appearance: Normal appearance.  ?Musculoskeletal:  ?   Left hand: Tenderness (MCP joint tenderness of the thumb that radiates into radial aspect of wrist) present. No swelling or deformity. Normal strength. Normal capillary refill. Normal pulse.  ?Neurological:  ?   Mental Status: She is alert.  ? ? ? ?UC Treatments / Results  ?Labs ?(all labs ordered are listed, but only abnormal results are displayed) ?Labs Reviewed - No data to display ? ?EKG ? ? ?Radiology ?DG Hand Complete Left ? ?Result Date: 09/16/2021 ?CLINICAL DATA:  Left hand pain, repetitive use, no specific injury EXAM: LEFT HAND - COMPLETE 3+ VIEW COMPARISON:  None Available. FINDINGS: There is no evidence of fracture or dislocation. There is no evidence of arthropathy or other focal bone abnormality. Soft tissues are unremarkable. IMPRESSION: No fracture or dislocation of the left hand. Joint spaces are well preserved. No radiographic findings to explain pain. Electronically Signed   By: Jearld LeschAlex D Bibbey M.D.   On: 09/16/2021 09:22   ? ?Procedures ?Procedures (including critical care time) ? ?Medications Ordered in UC ?Medications - No data to display ? ?Initial Impression /  Assessment and Plan / UC Course  ?I have reviewed the triage vital signs and the nursing notes. ? ?Pertinent labs & imaging results that were available during my care of the patient were reviewed by me and considered in my medical decision making (see chart for details). ? ?The  patient is a 26 year old female who presents with left hand pain.  Symptoms have been present for the past week.  Patient denies any known injury or trauma.  On exam, she has moderate tenderness in the MCP joint of the thumb with radiation of pain into the wrist.  The thenar aspect of the left hand is also tender to palpation.  Symptoms are consistent with de Quervain's tenosynovitis versus the left hand pain versus carpal tunnel syndrome.  Her x-rays were negative for any fracture or dislocation or any etiology of her current pain.  In the interim, a left wrist brace was provided to provide support and compression for the left thumb and wrist.  Ibuprofen was also provided for her pain.  Patient was given a note to limit her lifting to 20 pounds.  Patient advised that she will need to follow-up with orthopedics or hand specialist if her symptoms do not improve.  Patient was given the information for Ortho care of [place] and for [facility].  Follow-up as needed. ?Final Clinical Impressions(s) / UC Diagnoses  ? ?Final diagnoses:  ?Left hand pain  ? ? ? ?Discharge Instructions   ? ?  ?Your x-rays are negative today. ?As discussed, if your symptoms continue to persist, you will need to follow-up with a hand specialist. ?Gentle range of motion exercises to keep the hand joint mobile. ?Take medication as prescribed. ?Wear the brace provided to help with strenuous activity and to provide additional support. ?May apply ice to the affected area, on for 20 minutes, off for 1 hour, then repeat as needed. ?I am providing the information for you for Ortho care at [facility].  You can also contact [facility] at [phone] for further evaluation if  needed. ? ? ? ? ?ED Prescriptions   ? ? Medication Sig Dispense Auth. Provider  ? ibuprofen (ADVIL) 800 MG tablet Take 1 tablet (800 mg total) by mouth every 8 (eight) hours as needed. 30 tablet Tobe Kervin-Warren, [employer]

## 2021-09-16 NOTE — ED Triage Notes (Signed)
Left hand pain that radiates up arm.  States having pain around left thumb area too.  Pain since Friday.  Uses hands a lot at work, but no known injury ?

## 2021-09-26 ENCOUNTER — Telehealth: Payer: 59 | Admitting: Emergency Medicine

## 2021-09-26 DIAGNOSIS — J019 Acute sinusitis, unspecified: Secondary | ICD-10-CM | POA: Diagnosis not present

## 2021-09-26 DIAGNOSIS — B9789 Other viral agents as the cause of diseases classified elsewhere: Secondary | ICD-10-CM

## 2021-09-26 MED ORDER — AZELASTINE HCL 0.1 % NA SOLN: 2.0000 | Freq: Two times a day (BID) | NASAL | 0 refills | Status: DC

## 2021-09-26 NOTE — Progress Notes (Signed)
E-Visit for Sinus Problems  We are sorry that you are not feeling well.  Here is how we plan to help!  Based on what you have shared with me it looks like you have sinusitis.  Sinusitis is inflammation and infection in the sinus cavities of the head.  Based on your presentation I believe you most likely have Acute Viral Sinusitis.This is an infection most likely caused by a virus. There is not specific treatment for viral sinusitis other than to help you with the symptoms until the infection runs its course.  Antibiotics are not recommended by the Infectious Disease Society of American unless you have severe symptoms (including high fever) or you have symptoms for more than 10 days. If you still have symptoms after 10 days, antibiotics should be considered.    You may use an oral decongestant such as Mucinex D or if you have glaucoma or high blood pressure use plain Mucinex.   Saline nasal spray help and can safely be used as often as needed for congestion. Try using saline irrigation, such as with a neti pot, several times a day while you are sick. Many neti pots come with salt packets premeasured to use to make saline. If you use your own salt, make sure it is kosher salt or sea salt (don't use table salt as it has iodine in it and you don't need that in your nose). Use distilled water to make saline. If you mix your own saline using your own salt, the recipe is 1/4 teaspoon salt in 1 cup warm water. Using saline irrigation can help prevent and treat sinus infections.   I have prescribed: Azelastine nasal spray 2 sprays in each nostril twice a day  Some authorities believe that zinc sprays or the use of Echinacea may shorten the course of your symptoms.  Sinus infections are not as easily transmitted as other respiratory infection, however we still recommend that you avoid close contact with loved ones, especially the very young and elderly.  Remember to wash your hands thoroughly throughout the day  as this is the number one way to prevent the spread of infection!  Home Care: Only take medications as instructed by your medical team. Do not take these medications with alcohol. A steam or ultrasonic humidifier can help congestion.  You can place a towel over your head and breathe in the steam from hot water coming from a faucet. Avoid close contacts especially the very young and the elderly. Cover your mouth when you cough or sneeze. Always remember to wash your hands.  Get Help Right Away If: You develop worsening fever or sinus pain. You develop a severe head ache or visual changes. Your symptoms persist after you have completed your treatment plan.  Make sure you Understand these instructions. Will watch your condition. Will get help right away if you are not doing well or get worse.   Thank you for choosing an e-visit.  Your e-visit answers were reviewed by a board certified advanced clinical practitioner to complete your personal care plan. Depending upon the condition, your plan could have included both over the counter or prescription medications.  Please review your pharmacy choice. Make sure the pharmacy is open so you can pick up prescription now. If there is a problem, you may contact your provider through [employer] and have the prescription routed to another pharmacy.  Your safety is important to [name]. If you have drug allergies check your prescription carefully.   For the  next 24 hours you can use MyChart to ask questions about today's visit, request a non-urgent call back, or ask for a work or school excuse. You will get an email in the next two days asking about your experience. I hope that your e-visit has been valuable and will speed your recovery.  I have spent 5 minutes in review of e-visit questionnaire, review and updating patient chart, medical decision making and response to patient.   Willeen Cass, PhD, FNP-BC

## 2021-09-29 ENCOUNTER — Encounter: Payer: Self-pay | Admitting: Family Medicine

## 2021-09-29 ENCOUNTER — Ambulatory Visit (INDEPENDENT_AMBULATORY_CARE_PROVIDER_SITE_OTHER): Payer: 59 | Admitting: Family Medicine

## 2021-09-29 VITALS — BP 127/85 | HR 89 | Temp 96.6°F | Ht 69.0 in | Wt 273.0 lb

## 2021-09-29 DIAGNOSIS — L0591 Pilonidal cyst without abscess: Secondary | ICD-10-CM | POA: Diagnosis not present

## 2021-09-29 NOTE — Progress Notes (Signed)
BP 127/85   Pulse 89   Temp (!) 96.6 F (35.9 C)   Ht 5\' 9"  (1.753 m)   Wt 273 lb (123.8 kg)   SpO2 99%   BMI 40.32 kg/m    Subjective:   Patient ID: , female    DOB: April 22, 1996, 26 y.o.   MRN: [id number]  HPI: Amy Nichols is a 26 y.o. female presenting on 09/29/2021 for Cyst (Buttocks, not inflamed. 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  She said that swollen for couple days ago and was red but now that is calmed down a little bit but is still very tender to her.  She was post go see a 10/01/2021 in the past but due to insurance problems she was unable to at the time but she would like to go see a surgeon to possibly get the cyst removed.  Relevant past medical, surgical, family and social history reviewed and updated as indicated. Interim medical history since our last visit reviewed. Allergies and medications reviewed and updated.  Review of Systems  Constitutional:  Negative for chills and fever.  Eyes:  Negative for visual disturbance.  Respiratory:  Negative for chest tightness and shortness of breath.   Cardiovascular:  Negative for chest pain and leg swelling.  Musculoskeletal:  Negative for back pain and gait problem.  Skin:  Negative for color change, rash and wound.  Neurological:  Negative for light-headedness and headaches.  Psychiatric/Behavioral:  Negative for agitation and behavioral problems.   All other systems reviewed and are negative.  Per HPI unless specifically indicated above   Allergies as of 09/29/2021   No Known Allergies      Medication List        Accurate as of Sep 29, 2021  8:30 AM. If you have any questions, ask your nurse or doctor.          STOP taking these medications    azelastine 0.1 % nasal spray Commonly known as: ASTELIN Stopped by: Oct 01, 2021, MD    cetirizine 10 MG tablet Commonly known as: ZyrTEC Allergy Stopped by: [name], MD   fluticasone 50 MCG/ACT nasal spray Commonly known as: FLONASE Stopped by: [name], MD   ipratropium 0.03 % nasal spray Commonly known as: ATROVENT Stopped by: [name], MD   olopatadine 0.1 % ophthalmic solution Commonly known as: Patanol Stopped by: [name], MD   pseudoephedrine 30 MG tablet Commonly known as: SUDAFED Stopped by: [name], MD       TAKE these medications    albuterol 108 (90 Base) MCG/ACT inhaler Commonly known as: VENTOLIN HFA Inhale 2 puffs into the lungs every 6 (six) hours as needed for wheezing or shortness of breath.   Eucrisa 2 % Oint Generic drug: Crisaborole Apply small amount to area BID.   ibuprofen 800 MG tablet Commonly known as: ADVIL Take 1 tablet (800 mg total) by mouth every 8 (eight) hours as needed.   KYLEENA IU by Intrauterine route.   omeprazole 40 MG capsule Commonly known as: PRILOSEC Take 1 capsule (40 mg total) by mouth 2 (two) times daily before a meal.   sertraline 100 MG tablet Commonly known as: ZOLOFT Take 1 tablet (100 mg total) by mouth daily.   traZODone 50 MG tablet Commonly known as: DESYREL TAKE 0.5-1  TABLETS BY MOUTH AT BEDTIME AS NEEDED FOR SLEEP.         Objective:   BP 127/85   Pulse 89   Temp (!) 96.6 F (35.9 C)   Ht 5\' 9"  (1.753 m)   Wt 273 lb (123.8 kg)   SpO2 99%   BMI 40.32 kg/m   Wt Readings from Last 3 Encounters:  09/29/21 273 lb (123.8 kg)  07/02/21 281 lb (127.5 kg)  03/17/21 270 lb (122.5 kg)    Physical Exam Vitals and nursing note reviewed.  Constitutional:      General: She is not in acute distress.    Appearance: She is well-developed. She is not diaphoretic.  Eyes:     Conjunctiva/sclera: Conjunctivae normal.  Musculoskeletal:        General: No tenderness. Normal range of motion.  Skin:    General: Skin is warm and dry.      Findings: Lesion (Small pilonidal cyst and upper gluteal cleft, tender to palpation, no erythema or drainage or purulence.  Small area of induration about 0.2 cm in size, no fluctuation) present. No rash.  Neurological:     Mental Status: She is alert and oriented to person, place, and time.     Coordination: Coordination normal.  Psychiatric:        Behavior: Behavior normal.      Assessment & Plan:   Problem List Items Addressed This Visit   None Visit Diagnoses     Pilonidal cyst    -  Primary   Relevant Orders   Ambulatory referral to General Surgery       Placed referral to general surgery, area does not look infected or erythematous right now so no antibiotic warranted today and very small with no drainage Follow up plan: Return if symptoms worsen or fail to improve.  Counseling provided for all of the vaccine components Orders Placed This Encounter  Procedures   Ambulatory referral to General Surgery    13/07/22, MD [facility] 09/29/2021, 8:30 AM

## 2021-10-01 ENCOUNTER — Encounter: Payer: Self-pay | Admitting: *Deleted

## 2021-10-16 ENCOUNTER — Ambulatory Visit: Payer: 59 | Admitting: General Surgery

## 2021-10-21 IMAGING — US US ABDOMEN LIMITED RUQ/ASCITES
1 series · 14 of 25 positions shown · non-contrast
Comparison: None.

CLINICAL DATA: Right upper quadrant pain

EXAM:
ULTRASOUND ABDOMEN LIMITED RIGHT UPPER QUADRANT

[Series 1: us abdomen limited ruq (liver/gb) · 14 of 51 slices shown]
[im 1/51]
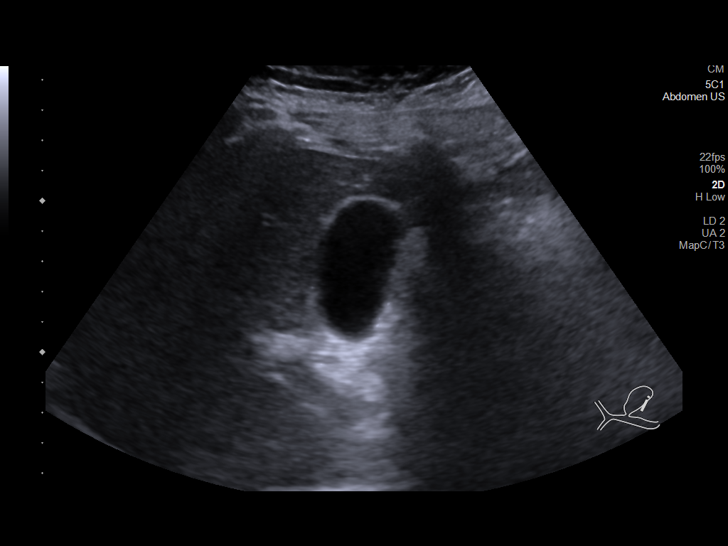
[im 5/51]
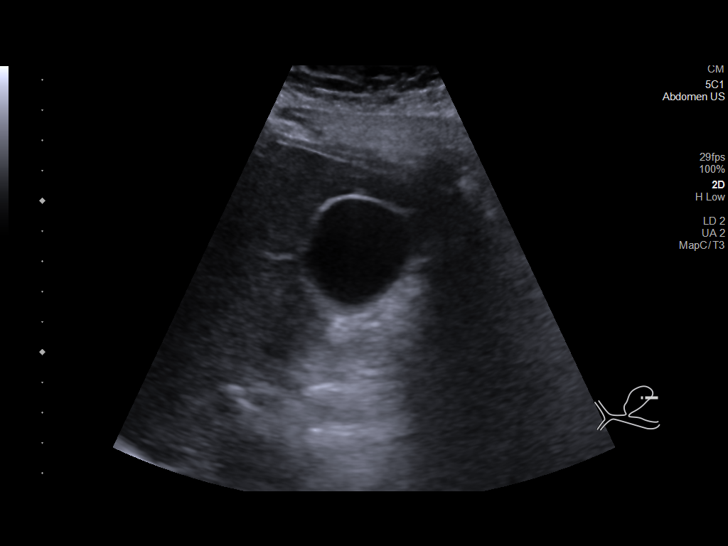
[im 9/51]
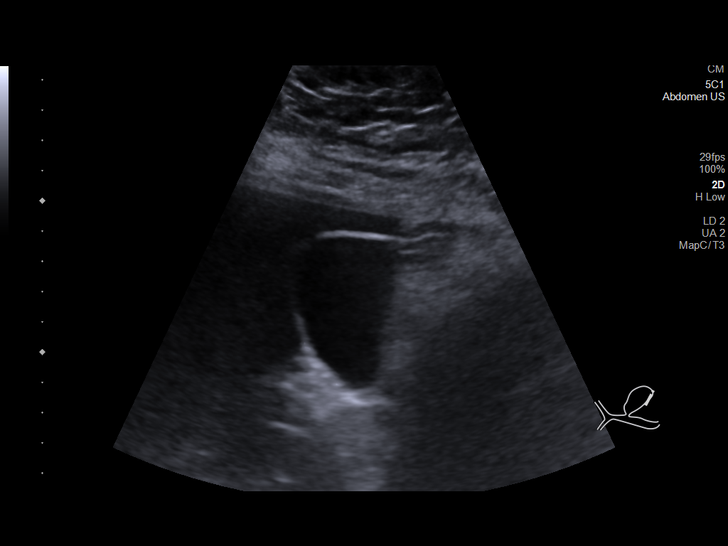
[im 13/51]
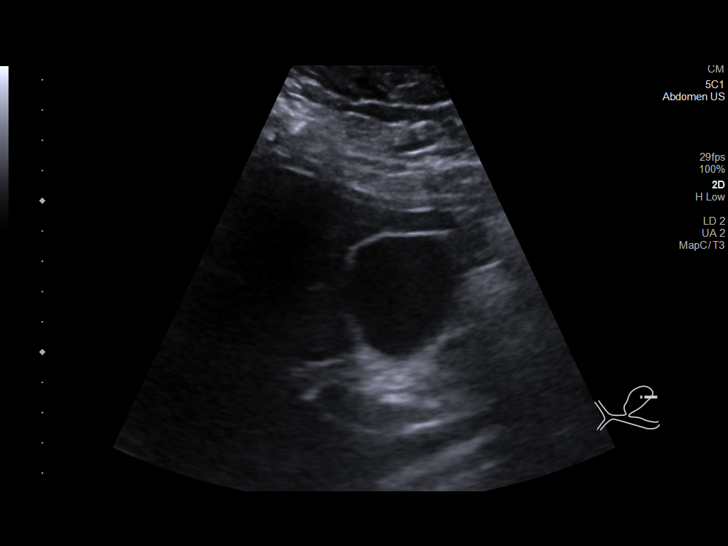
[im 17/51]
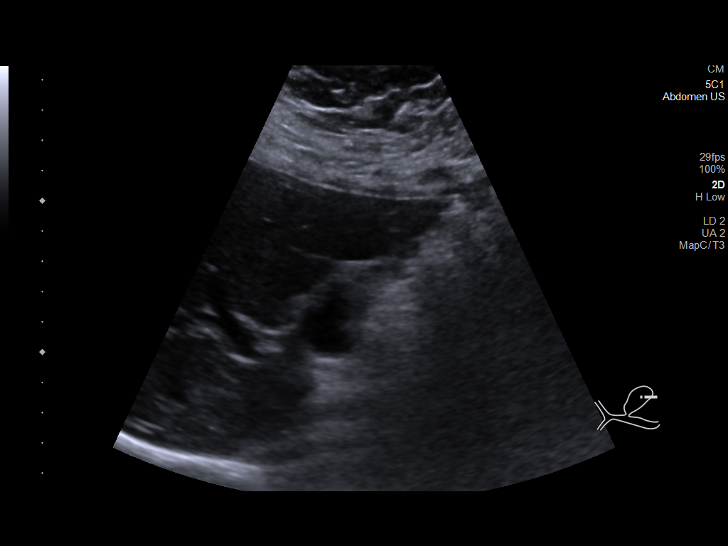
[im 19/51]
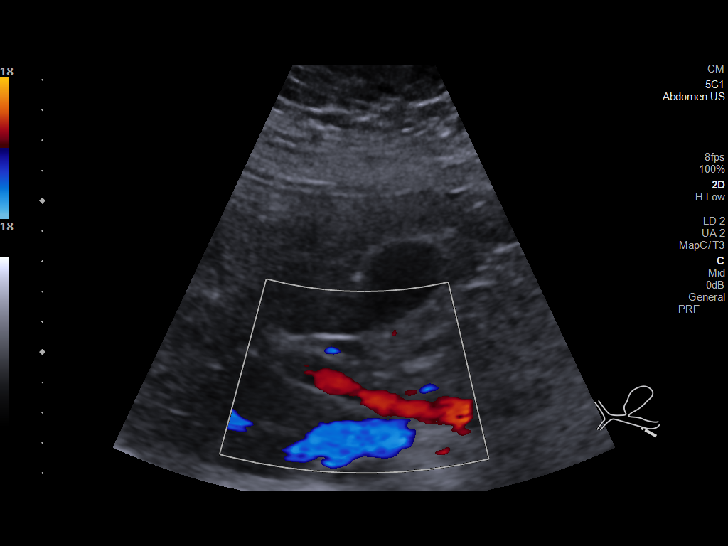
[im 23/51]
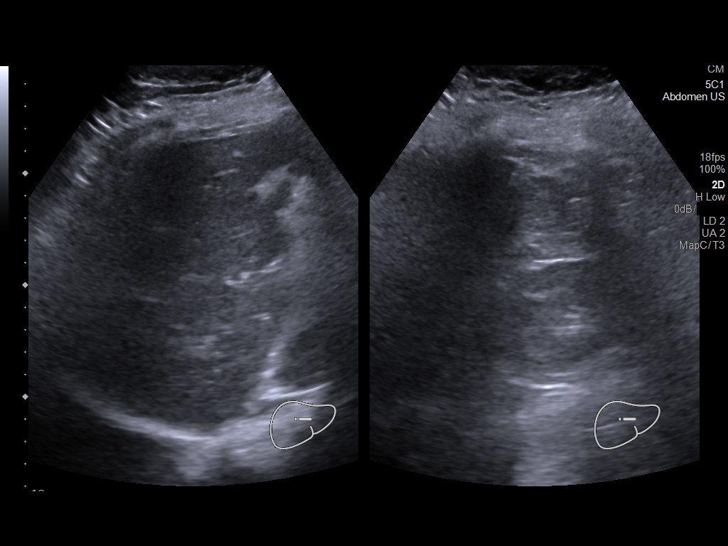
[im 28/51]
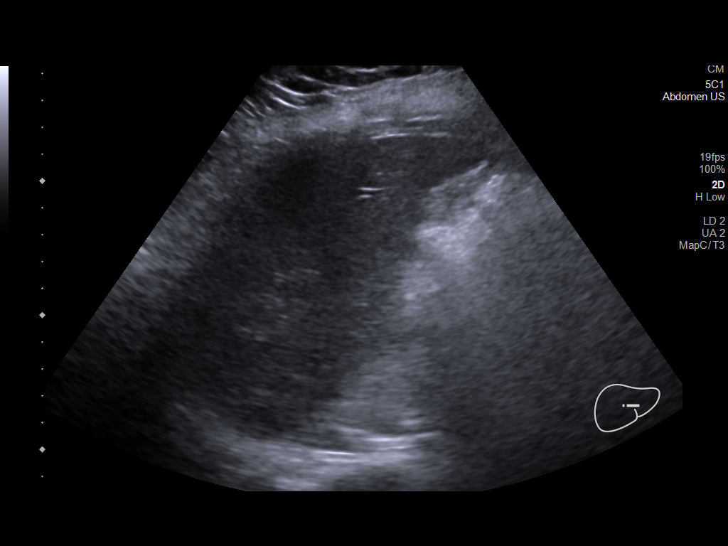
[im 32/51]
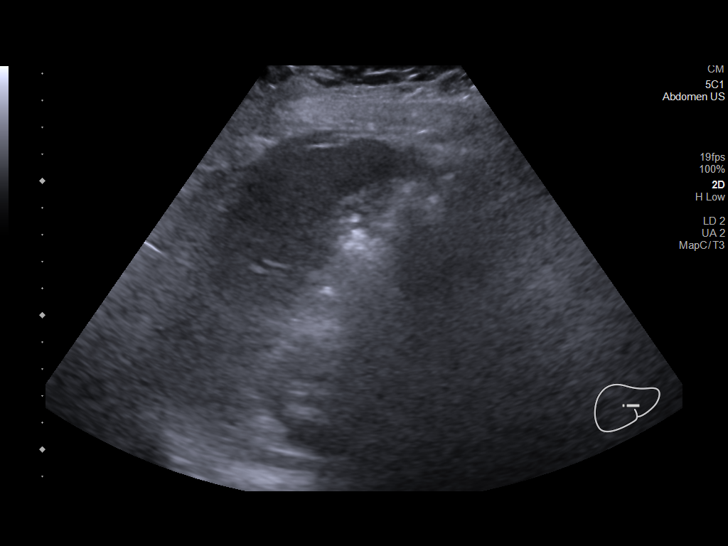
[im 34/51]
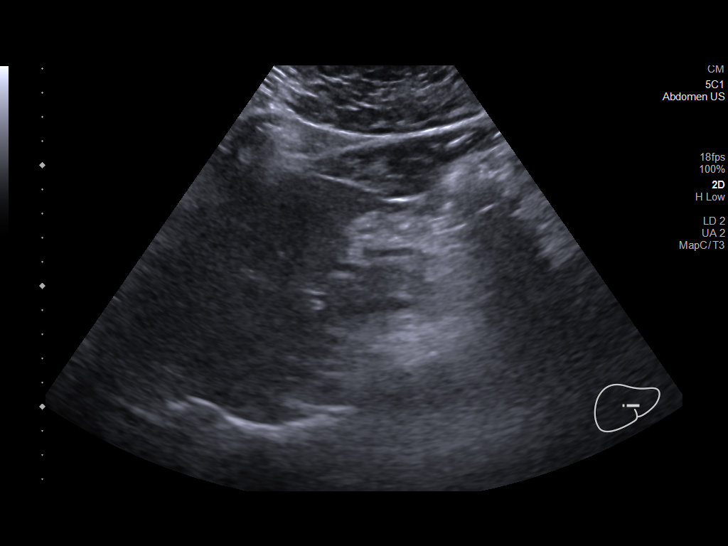
[im 38/51]
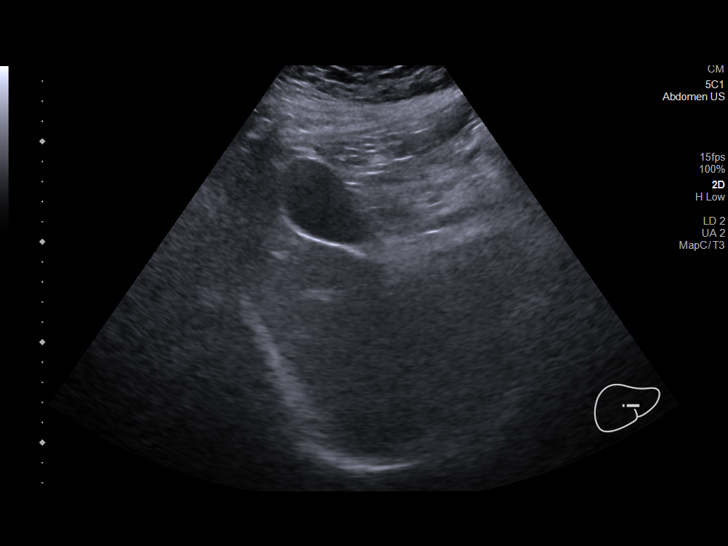
[im 42/51]
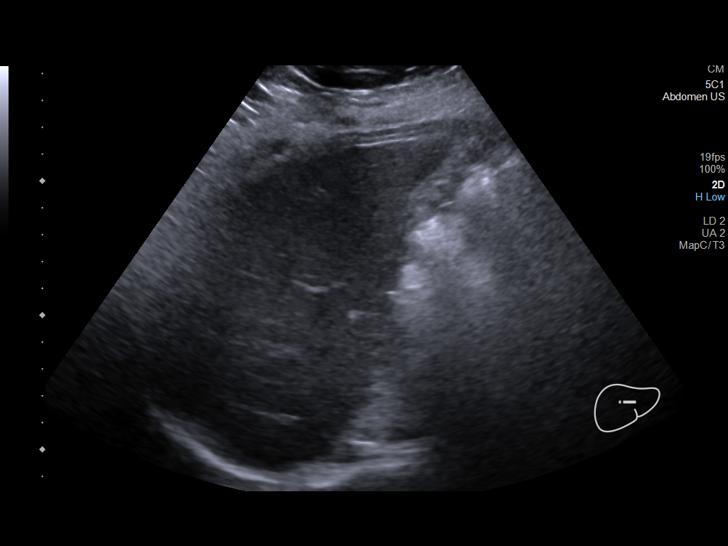
[im 46/51]
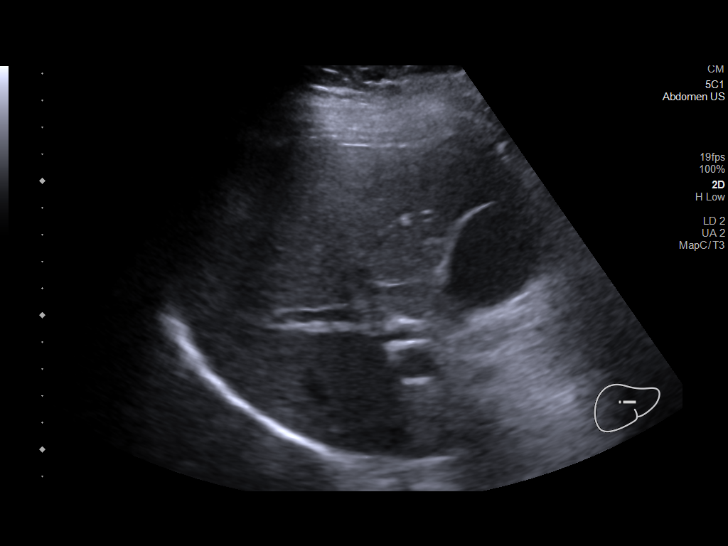
[im 51/51]
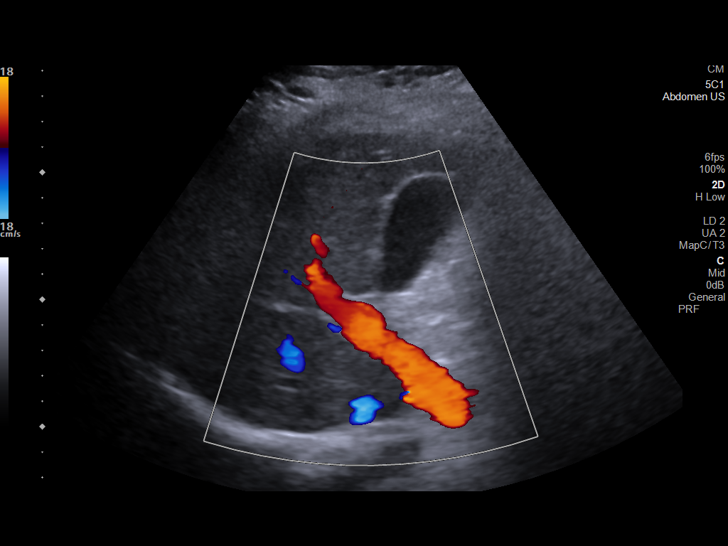

[14 of 25 positions shown; findings below may reference images not displayed]

FINDINGS: Gallbladder:

No gallstones or wall thickening visualized. No sonographic Murphy
sign noted by sonographer.

Common bile duct:

Diameter: 4 mm

Liver:

No focal lesion identified. Within normal limits in parenchymal
echogenicity. Portal vein is patent on color Doppler imaging with
normal direction of blood flow towards the liver.

Other: None.
IMPRESSION: No acute abnormality noted.

## 2021-10-27 ENCOUNTER — Ambulatory Visit: Payer: 59 | Admitting: Family Medicine

## 2021-12-02 ENCOUNTER — Other Ambulatory Visit: Payer: Self-pay

## 2021-12-02 ENCOUNTER — Emergency Department (HOSPITAL_COMMUNITY): Payer: Self-pay

## 2021-12-02 ENCOUNTER — Encounter (HOSPITAL_COMMUNITY): Payer: Self-pay | Admitting: Emergency Medicine

## 2021-12-02 ENCOUNTER — Emergency Department (HOSPITAL_COMMUNITY)
Admission: EM | Admit: 2021-12-02 | Discharge: 2021-12-02 | Discharge status: Against Medical Advice | Payer: Self-pay | Attending: Emergency Medicine | Admitting: Emergency Medicine

## 2021-12-02 DIAGNOSIS — R1032 Left lower quadrant pain: Secondary | ICD-10-CM | POA: Insufficient documentation

## 2021-12-02 DIAGNOSIS — Z5329 Procedure and treatment not carried out because of patient's decision for other reasons: Secondary | ICD-10-CM | POA: Insufficient documentation

## 2021-12-02 LAB — CBC WITH DIFFERENTIAL/PLATELET
Abs Immature Granulocytes: 0.02 10*3/uL (ref 0.00–0.07)
Basophils Absolute: 0 10*3/uL (ref 0.0–0.1)
Basophils Relative: 0 %
Eosinophils Absolute: 0.1 10*3/uL (ref 0.0–0.5)
Eosinophils Relative: 1 %
HCT: 41.3 % (ref 36.0–46.0)
Hemoglobin: 13.3 g/dL (ref 12.0–15.0)
Immature Granulocytes: 0 %
Lymphocytes Relative: 33 %
Lymphs Abs: 2.4 10*3/uL (ref 0.7–4.0)
MCH: 28.7 pg (ref 26.0–34.0)
MCHC: 32.2 g/dL (ref 30.0–36.0)
MCV: 89.2 fL (ref 80.0–100.0)
Monocytes Absolute: 0.5 10*3/uL (ref 0.1–1.0)
Monocytes Relative: 7 %
Neutro Abs: 4.3 10*3/uL (ref 1.7–7.7)
Neutrophils Relative %: 59 %
Platelets: 178 10*3/uL (ref 150–400)
RBC: 4.63 MIL/uL (ref 3.87–5.11)
RDW: 13.9 % (ref 11.5–15.5)
WBC: 7.2 10*3/uL (ref 4.0–10.5)
nRBC: 0 % (ref 0.0–0.2)

## 2021-12-02 LAB — COMPREHENSIVE METABOLIC PANEL
ALT: 23 U/L (ref 0–44)
AST: 17 U/L (ref 15–41)
Albumin: 3.7 g/dL (ref 3.5–5.0)
Alkaline Phosphatase: 61 U/L (ref 38–126)
Anion gap: 4 — ABNORMAL LOW (ref 5–15)
BUN: 10 mg/dL (ref 6–20)
CO2: 25 mmol/L (ref 22–32)
Calcium: 8.8 mg/dL — ABNORMAL LOW (ref 8.9–10.3)
Chloride: 110 mmol/L (ref 98–111)
Creatinine, Ser: 0.67 mg/dL (ref 0.44–1.00)
GFR, Estimated: >60 mL/min (ref >60)
Glucose, Bld: 105 mg/dL — ABNORMAL HIGH (ref 70–99)
Potassium: 3.8 mmol/L (ref 3.5–5.1)
Sodium: 139 mmol/L (ref 135–145)
Total Bilirubin: 0.7 mg/dL (ref 0.3–1.2)
Total Protein: 8 g/dL (ref 6.5–8.1)

## 2021-12-02 LAB — URINALYSIS, ROUTINE W REFLEX MICROSCOPIC
Bilirubin Urine: NEGATIVE
Glucose, UA: NEGATIVE mg/dL
Hgb urine dipstick: NEGATIVE
Ketones, ur: NEGATIVE mg/dL
Leukocytes,Ua: NEGATIVE
Nitrite: NEGATIVE
Protein, ur: NEGATIVE mg/dL
Specific Gravity, Urine: 1.025 (ref 1.005–1.030)
pH: 6 (ref 5.0–8.0)

## 2021-12-02 LAB — PREGNANCY, URINE: Preg Test, Ur: NEGATIVE

## 2021-12-02 MED ORDER — IOHEXOL 300 MG/ML  SOLN
100.0000 mL | Freq: Once | INTRAMUSCULAR | Status: AC | PRN
  Administered 2021-12-02: 100 mL via INTRAVENOUS

## 2021-12-02 NOTE — ED Notes (Signed)
Pt requesting IV out and to leave AMA. IV removed and AMA signed.

## 2021-12-02 NOTE — ED Triage Notes (Signed)
Pt to the ED with lower left abdominal pain with a history of constipation and ovarian cysts.

## 2021-12-02 NOTE — ED Provider Notes (Signed)
[facility] EMERGENCY DEPARTMENT Provider Note   CSN: [id number] Arrival date & time: 12/02/21  4235     History  Chief Complaint  Patient presents with   Abdominal Pain    Amy Nichols is a 26 y.o. female.  Pt complains of llq pain.  Patient has a history of ovarian cyst  The history is provided by the patient and medical records. No language interpreter was used.  Abdominal Pain Pain location:  LLQ Pain quality: aching   Pain radiates to:  Does not radiate Pain severity:  Moderate Onset quality:  Sudden Timing:  Constant Progression:  Worsening Chronicity:  Recurrent Context: not alcohol use   Relieved by:  Nothing Worsened by:  Nothing Associated symptoms: no chest pain, no cough, no diarrhea, no fatigue and no hematuria        Home Medications Prior to Admission medications   Medication Sig Start Date End Date Taking? Authorizing Provider  albuterol (VENTOLIN HFA) 108 (90 Base) MCG/ACT inhaler Inhale 2 puffs into the lungs every 6 (six) hours as needed for wheezing or shortness of breath. 01/13/21  Yes Margaretann Loveless, PA-C  Levonorgestrel (KYLEENA IU) by Intrauterine route.   Yes [provider]  omeprazole (PRILOSEC) 40 MG capsule Take 1 capsule (40 mg total) by mouth 2 (two) times daily before a meal. Patient taking differently: Take 40 mg by mouth daily as needed (acid reflux). 06/09/21  Yes Dettinger, [name], MD  sertraline (ZOLOFT) 100 MG tablet Take 1 tablet (100 mg total) by mouth daily. 08/26/21  Yes Dettinger, [name], MD  traZODone (DESYREL) 50 MG tablet TAKE 0.5-1 TABLETS BY MOUTH AT BEDTIME AS NEEDED FOR SLEEP. Patient taking differently: Take 50 mg by mouth at bedtime as needed for sleep. 09/15/21  Yes Dettinger, [name], MD      Allergies    Patient has no known allergies.    Review of Systems   Review of Systems  Constitutional:  Negative for appetite change and fatigue.  HENT:  Negative for congestion, ear discharge and sinus  pressure.   Eyes:  Negative for discharge.  Respiratory:  Negative for cough.   Cardiovascular:  Negative for chest pain.  Gastrointestinal:  Positive for abdominal pain. Negative for diarrhea.  Genitourinary:  Negative for frequency and hematuria.  Musculoskeletal:  Negative for back pain.  Skin:  Negative for rash.  Neurological:  Negative for seizures and headaches.  Psychiatric/Behavioral:  Negative for hallucinations.     Physical Exam Updated Vital Signs BP 115/83   Pulse 73   Temp 98.7 F (37.1 C) (Oral)   Resp 16   Ht 5\' 9"  (1.753 m)   Wt 123.8 kg   SpO2 99%   BMI 40.30 kg/m  Physical Exam Vitals and nursing note reviewed.  Constitutional:      Appearance: She is well-developed.  HENT:     Head: Normocephalic.     Nose: Nose normal.  Eyes:     General: No scleral icterus.    Conjunctiva/sclera: Conjunctivae normal.  Neck:     Thyroid: No thyromegaly.  Cardiovascular:     Rate and Rhythm: Normal rate and regular rhythm.     Heart sounds: No murmur heard.    No friction rub. No gallop.  Pulmonary:     Breath sounds: No stridor. No wheezing or rales.  Chest:     Chest wall: No tenderness.  Abdominal:     General: There is no distension.     Tenderness:  There is abdominal tenderness. There is no rebound.  Musculoskeletal:        General: Normal range of motion.     Cervical back: Neck supple.  Lymphadenopathy:     Cervical: No cervical adenopathy.  Skin:    Findings: No erythema or rash.  Neurological:     Mental Status: She is alert and oriented to person, place, and time.     Motor: No abnormal muscle tone.     Coordination: Coordination normal.  Psychiatric:        Behavior: Behavior normal.     ED Results / Procedures / Treatments   Labs (all labs ordered are listed, but only abnormal results are displayed) Labs Reviewed  COMPREHENSIVE METABOLIC PANEL - Abnormal; Notable for the following components:      Result Value   Glucose, Bld 105 (*)     Calcium 8.8 (*)    Anion gap 4 (*)    All other components within normal limits  URINALYSIS, ROUTINE W REFLEX MICROSCOPIC - Abnormal; Notable for the following components:   APPearance HAZY (*)    All other components within normal limits  CBC WITH DIFFERENTIAL/PLATELET  PREGNANCY, URINE    EKG None  Radiology CT ABDOMEN PELVIS W CONTRAST  Result Date: 12/02/2021 CLINICAL DATA:  Acute non localized abdominal pain EXAM: CT ABDOMEN AND PELVIS WITH CONTRAST TECHNIQUE: Multidetector CT imaging of the abdomen and pelvis was performed using the standard protocol following bolus administration of intravenous contrast. RADIATION DOSE REDUCTION: This exam was performed according to the departmental dose-optimization program which includes automated exposure control, adjustment of the mA and/or kV according to patient size and/or use of iterative reconstruction technique. CONTRAST:  OMNIPAQUE IOHEXOL 300 MG/ML  SOLN COMPARISON:  Pelvic ultrasound 12/02/2021 FINDINGS: Lower chest: No acute abnormality. Hepatobiliary: No focal liver abnormality is seen. No gallstones, gallbladder wall thickening, or biliary dilatation. Pancreas: Unremarkable. No pancreatic ductal dilatation or surrounding inflammatory changes. Spleen: Normal in size without focal abnormality. Adrenals/Urinary Tract: Adrenal glands are unremarkable. Kidneys are normal, without renal calculi, focal lesion, or hydronephrosis. Bladder is decompressed. Stomach/Bowel: Stomach is within normal limits. Appendix. No evidence of bowel wall thickening, distention, or inflammatory changes. Vascular/Lymphatic: No significant vascular findings are present. No enlarged abdominal or pelvic lymph nodes. Reproductive: IUD in the uterus. See same day ultrasound report for further information regarding positioning. Unremarkable adnexa. Other: No free intraperitoneal fluid or air. Musculoskeletal: No acute or significant osseous findings. IMPRESSION: No  acute abnormality in the abdomen or pelvis. Electronically Signed   By: Minerva Fester M.D.   On: 12/02/2021 13:38   US PELVIC COMPLETE W TRANSVAGINAL AND TORSION R/O  Result Date: 12/02/2021 CLINICAL DATA:  Left lower quadrant abdominal pain times 1-2 days. EXAM: TRANSABDOMINAL AND TRANSVAGINAL ULTRASOUND OF PELVIS DOPPLER ULTRASOUND OF OVARIES TECHNIQUE: Both transabdominal and transvaginal ultrasound examinations of the pelvis were performed. Transabdominal technique was performed for global imaging of the pelvis including uterus, ovaries, adnexal regions, and pelvic cul-de-sac. It was necessary to proceed with endovaginal exam following the transabdominal exam to visualize the uterus, endometrium, ovaries and adnexa. Color and duplex Doppler ultrasound was utilized to evaluate blood flow to the ovaries. COMPARISON:  None Available. FINDINGS: Uterus Measurements: 8.1 x 4.8 x 3.6 cm = volume: 74 mL. No fibroids or other mass visualized. Endometrium Thickness: 3 mm. Intrauterine in place, with slightly low lying position with the inferior aspect positioned in the lower uterine segment. Right ovary Measurements: 3.4 x 3.2 x 2.0 cm =  volume: 11.5 mL. Normal appearance/no adnexal mass. Left ovary Measurements: 3.5 x 3.2 x 1.8 cm = volume: 10.5 mL. Normal appearance/no adnexal mass. Pulsed Doppler evaluation of both ovaries demonstrates normal low-resistance arterial and venous waveforms. Other findings Trace physiologic volume of pelvic free fluid. IMPRESSION: 1. No evidence of ovarian torsion or other acute abnormality. 2. Possible low positioning of the intrauterine device. Electronically Signed   By: Maudry Mayhew M.D.   On: 12/02/2021 11:16    Procedures Procedures    Medications Ordered in ED Medications  iohexol (OMNIPAQUE) 300 MG/ML solution 100 mL (100 mLs Intravenous Contrast Given 12/02/21 1317)    ED Course/ Medical Decision Making/ A&P                           Medical Decision  Making Amount and/or Complexity of Data Reviewed Labs: ordered. Radiology: ordered.  Risk Prescription drug management.    This patient presents to the ED for concern of abdominal pain, this involves an extensive number of treatment options, and is a complaint that carries with it a high risk of complications and morbidity.  The differential diagnosis includes ovarian cyst, ectopic pregnancy, diverticulitis   Co morbidities that complicate the patient evaluation  Ovarian cyst   Additional history obtained:  Additional history obtained from friend External records from outside source obtained and reviewed including hospital record   Lab Tests:  I Ordered, and personally interpreted labs.  The pertinent results include: CBC, urinalysis, chemistries all negative   Imaging Studies ordered:  I ordered imaging studies including ultrasound of the pelvis and CT abdomen I independently visualized and interpreted imaging which showed negative I agree with the radiologist interpretation   Cardiac Monitoring: / EKG:  The patient was maintained on a cardiac monitor.  I personally viewed and interpreted the cardiac monitored which showed an underlying rhythm of: Normal sinus rhythm   Consultations Obtained:  No consultant Problem List / ED Course / Critical interventions / Medication management  Abdominal pain no medicine given Reevaluation of the patient after these medicines showed that the patient stayed the same I have reviewed the patients home medicines and have made adjustments as needed   Social Determinants of Health:  None   Test / Admission - Considered:  None   Patient with left lower quad abdominal pain.  She left AMA before I was able to discuss the results        Final Clinical Impression(s) / ED Diagnoses Final diagnoses:  None    Rx / DC Orders ED Discharge Orders     None         Bethann Berkshire, MD 12/02/21 1712

## 2021-12-02 NOTE — ED Notes (Signed)
Patient transported to Ultrasound 

## 2022-01-01 ENCOUNTER — Other Ambulatory Visit: Payer: Self-pay

## 2022-01-01 ENCOUNTER — Encounter (HOSPITAL_COMMUNITY): Payer: Self-pay

## 2022-01-01 ENCOUNTER — Emergency Department (HOSPITAL_COMMUNITY)
Admission: EM | Admit: 2022-01-01 | Discharge: 2022-01-01 | Disposition: A | Discharge status: Home / Self Care | Payer: Self-pay | Attending: Emergency Medicine | Admitting: Emergency Medicine

## 2022-01-01 DIAGNOSIS — L0591 Pilonidal cyst without abscess: Secondary | ICD-10-CM

## 2022-01-01 DIAGNOSIS — L0501 Pilonidal cyst with abscess: Secondary | ICD-10-CM | POA: Insufficient documentation

## 2022-01-01 MED ORDER — DOXYCYCLINE HYCLATE 100 MG PO CAPS: 100.0000 mg | ORAL_CAPSULE | Freq: Two times a day (BID) | ORAL | 0 refills | Status: DC

## 2022-01-01 MED ORDER — LIDOCAINE-EPINEPHRINE (PF) 2 %-1:200000 IJ SOLN
20.0000 mL | Freq: Once | INTRAMUSCULAR | Status: AC
  Administered 2022-01-01: 20 mL
  Filled 2022-01-01: qty 20

## 2022-01-01 NOTE — Discharge Instructions (Addendum)
Diagnosed with pilonidal cyst. I have ordered a 10 day course of doxycyline for associated treatment infection. Attached is instructions for after care. If pain worsens, new fever, or abdominal pain please return to the ED for further evaluation. Advise to follow up with PCP in a few days to assess wound healing.

## 2022-01-01 NOTE — ED Provider Notes (Signed)
[facility] EMERGENCY DEPARTMENT Provider Note   CSN: [id number] Arrival date & time: 01/01/22  5638     History  Chief Complaint  Patient presents with   Abscess    Amy Nichols is a 26 y.o. female presenting for concern for abscess. Located in in pilonidal region. Noticed "something hard" in that area and last night it started to be painful. 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 She also endorses fever. Has tried warm compresses for pain ans swelling but has not helped much. Size is approx 1 inch in diameter. Denies oozing or bleeding.    Abscess      Home Medications Prior to Admission medications   Medication Sig Start Date End Date Taking? Authorizing Provider  doxycycline (VIBRAMYCIN) 100 MG capsule Take 1 capsule (100 mg total) by mouth 2 (two) times daily. 01/01/22  Yes Gareth Eagle, PA-C  albuterol (VENTOLIN HFA) 108 (90 Base) MCG/ACT inhaler Inhale 2 puffs into the lungs every 6 (six) hours as needed for wheezing or shortness of breath. 01/13/21   Margaretann Loveless, PA-C  Levonorgestrel (KYLEENA IU) by Intrauterine route.    [provider]  omeprazole (PRILOSEC) 40 MG capsule Take 1 capsule (40 mg total) by mouth 2 (two) times daily before a meal. Patient taking differently: Take 40 mg by mouth daily as needed (acid reflux). 06/09/21   Dettinger, [name], MD  sertraline (ZOLOFT) 100 MG tablet Take 1 tablet (100 mg total) by mouth daily. 08/26/21   Dettinger, [name], MD  traZODone (DESYREL) 50 MG tablet TAKE 0.5-1 TABLETS BY MOUTH AT BEDTIME AS NEEDED FOR SLEEP. Patient taking differently: Take 50 mg by mouth at bedtime as needed for sleep. 09/15/21   Dettinger, [name], MD      Allergies    Patient has no known allergies.    Review of Systems   Review of Systems  Skin:  Positive for wound.    Physical Exam Updated Vital Signs BP 133/82 (BP Location: Left Arm)   Pulse 74   Temp  97.6 F (36.4 C) (Oral)   Resp 16   Ht 5\' 9"  (1.753 m)   Wt 121.6 kg   SpO2 98%   BMI 39.58 kg/m  Physical Exam Constitutional:      Appearance: Normal appearance.  HENT:     Head: Normocephalic.     Nose: Nose normal.  Eyes:     Conjunctiva/sclera: Conjunctivae normal.  Pulmonary:     Effort: Pulmonary effort is normal.  Musculoskeletal:     Comments: Fluctuant lesion approx 1 inch in diameter in pilonidal area. Circumferential induration noted. Not swollen, erythematous, or oozing.   Neurological:     Mental Status: She is alert.  Psychiatric:        Mood and Affect: Mood normal.     ED Results / Procedures / Treatments   Labs (all labs ordered are listed, but only abnormal results are displayed) Labs Reviewed - No data to display  EKG None  Radiology No results found.  Procedures . Incision and Drainage  Date/Time: 01/01/2022 10:40 AM  Performed by: 01/03/2022, PA-C Authorized by: Gareth Eagle, PA-C   Consent:    Consent obtained:  Verbal   Consent given by:  Patient   Risks discussed:  Bleeding, incomplete drainage and infection Universal protocol:    Procedure explained and questions answered to patient or proxy's satisfaction: yes  Patient identity confirmed:  Verbally with patient and arm band Location:    Type:  Pilonidal cyst   Size:  1 inch in diamter   Location:  Anogenital   Anogenital location:  Pilonidal Pre-procedure details:    Skin preparation:  Povidone-iodine Anesthesia:    Anesthesia method:  Local infiltration   Local anesthetic:  Lidocaine 2% WITH epi Procedure type:    Complexity:  Simple Procedure details:    Incision types:  Stab incision   Wound management:  Probed and deloculated   Drainage:  Bloody and purulent   Wound treatment:  Wound left open   Packing materials:  1/4 in iodoform gauze   Amount 1/4" iodoform:  8 inches Post-procedure details:    Procedure completion:  Tolerated well, no immediate  complications     Medications Ordered in ED Medications  lidocaine-EPINEPHrine (XYLOCAINE W/EPI) 2 %-1:200000 (PF) injection 20 mL (20 mLs Infiltration Given 01/01/22 1021)    ED Course/ Medical Decision Making/ A&P                           Medical Decision Making  Patient presented for pilonidal lesion. Considered cyst vs abscess. I & D lesion and was able to express a small amount of pustulant content. Patient reported that her pain was much improved after the procedure. Started her on 10 day course of doxy and discussed return precautions. Advised patient to f/u with PCP for wound healing        Final Clinical Impression(s) / ED Diagnoses Final diagnoses:  Pilonidal cyst    Rx / DC Orders ED Discharge Orders          Ordered    doxycycline (VIBRAMYCIN) 100 MG capsule  2 times daily        01/01/22 1033              Gareth Eagle, PA-C 01/01/22 1044    Bethann Berkshire, MD 01/01/22 1711

## 2022-01-01 NOTE — ED Triage Notes (Signed)
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

## 2022-01-05 NOTE — ED Notes (Signed)
Pt needed a work note for work.

## 2022-03-31 ENCOUNTER — Telehealth: Payer: Self-pay

## 2022-03-31 NOTE — Telephone Encounter (Signed)
Transition Care Management Unsuccessful Follow-up Telephone Call  Date of discharge and from where:  03/30/2022 [facility]  Attempts:  1st Attempt  Reason for unsuccessful TCM follow-up call:  Left voice message    

## 2022-04-01 NOTE — Telephone Encounter (Signed)
Transition Care Management Follow-up Telephone Call Date of discharge and from where: 03/30/22  How have you been since you were released from the hospital? Patient is doing well  Any questions or concerns? No  Items Reviewed: Did the pt receive and understand the discharge instructions provided? Yes  Medications obtained and verified? Yes  Other? Yes  Any new allergies since your discharge? No  Dietary orders reviewed? Yes Do you have support at home? Yes   Home Care and Equipment/Supplies: Were home health services ordered? not applicable If so, what is the name of the agency? na  Has the agency set up a time to come to the patient's home? not applicable Were any new equipment or medical supplies ordered?  No What is the name of the medical supply agency? na Were you able to get the supplies/equipment? not applicable Do you have any questions related to the use of the equipment or supplies? No  Functional Questionnaire: (I = Independent and D = Dependent) ADLs: I  Bathing/Dressing- i  Meal Prep- i  Eating- i  Maintaining continence- i  Transferring/Ambulation- i  Managing Meds- i  Follow up appointments reviewed:  PCP Hospital f/u appt confirmed?  Declines at this time will follow up as needed    Specialist Hospital f/u appt confirmed? No   Are transportation arrangements needed? No  If their condition worsens, is the pt aware to call PCP or go to the Emergency Dept.? Yes Was the patient provided with contact information for the PCP's office or ED? Yes Was to pt encouraged to call back with questions or concerns? Yes

## 2022-04-01 NOTE — Telephone Encounter (Signed)
R/C to [facility]

## 2022-05-03 ENCOUNTER — Telehealth: Payer: Medicaid Other | Admitting: Nurse Practitioner

## 2022-05-03 DIAGNOSIS — R399 Unspecified symptoms and signs involving the genitourinary system: Secondary | ICD-10-CM | POA: Diagnosis not present

## 2022-05-03 DIAGNOSIS — N760 Acute vaginitis: Secondary | ICD-10-CM

## 2022-05-03 MED ORDER — NITROFURANTOIN MONOHYD MACRO 100 MG PO CAPS: 100.0000 mg | ORAL_CAPSULE | Freq: Two times a day (BID) | ORAL | 0 refills | Status: DC

## 2022-05-03 MED ORDER — FLUCONAZOLE 150 MG PO TABS: 150.0000 mg | ORAL_TABLET | Freq: Once | ORAL | 0 refills | Status: AC

## 2022-05-03 NOTE — Progress Notes (Signed)
I have spent 5 minutes in review of e-visit questionnaire, review and updating patient chart, medical decision making and response to patient.  ° °Phillippa Straub W Tene Gato, NP ° °  °

## 2022-05-03 NOTE — Progress Notes (Signed)

## 2022-05-03 NOTE — Addendum Note (Signed)
Addended by: Bertram Denver on: 05/03/2022 10:56 AM   Modules accepted: Orders

## 2022-05-06 ENCOUNTER — Other Ambulatory Visit: Payer: Self-pay | Admitting: Family Medicine

## 2022-05-15 ENCOUNTER — Other Ambulatory Visit (HOSPITAL_COMMUNITY)
Admission: RE | Admit: 2022-05-15 | Discharge: 2022-05-15 | Disposition: A | Discharge status: Home / Self Care | Payer: Medicaid Other | Source: Ambulatory Visit | Attending: Family Medicine | Admitting: Family Medicine

## 2022-05-15 ENCOUNTER — Encounter: Payer: Self-pay | Admitting: Family Medicine

## 2022-05-15 ENCOUNTER — Ambulatory Visit (INDEPENDENT_AMBULATORY_CARE_PROVIDER_SITE_OTHER): Payer: Medicaid Other | Admitting: Family Medicine

## 2022-05-15 VITALS — BP 137/88 | HR 100 | Temp 98.4°F | Ht 69.0 in | Wt 275.0 lb

## 2022-05-15 DIAGNOSIS — Z975 Presence of (intrauterine) contraceptive device: Secondary | ICD-10-CM

## 2022-05-15 DIAGNOSIS — Z01419 Encounter for gynecological examination (general) (routine) without abnormal findings: Secondary | ICD-10-CM | POA: Insufficient documentation

## 2022-05-15 DIAGNOSIS — Z01411 Encounter for gynecological examination (general) (routine) with abnormal findings: Secondary | ICD-10-CM

## 2022-05-15 DIAGNOSIS — E781 Pure hyperglyceridemia: Secondary | ICD-10-CM | POA: Diagnosis not present

## 2022-05-15 DIAGNOSIS — Z6841 Body Mass Index (BMI) 40.0 and over, adult: Secondary | ICD-10-CM

## 2022-05-15 DIAGNOSIS — I10 Essential (primary) hypertension: Secondary | ICD-10-CM | POA: Diagnosis not present

## 2022-05-15 DIAGNOSIS — F419 Anxiety disorder, unspecified: Secondary | ICD-10-CM | POA: Diagnosis not present

## 2022-05-15 MED ORDER — PAROXETINE HCL 20 MG PO TABS: 20.0000 mg | ORAL_TABLET | Freq: Every day | ORAL | 1 refills | Status: DC

## 2022-05-15 MED ORDER — OMEPRAZOLE 40 MG PO CPDR
40.0000 mg | DELAYED_RELEASE_CAPSULE | Freq: Two times a day (BID) | ORAL | 3 refills | Status: DC
Start: 2022-05-15 — End: 2023-05-24

## 2022-05-15 NOTE — Progress Notes (Signed)
BP 137/88   Pulse 100   Temp 98.4 F (36.9 C)   Ht 5\' 9"  (1.753 m)   Wt 275 lb (124.7 kg)   SpO2 100%   BMI 40.61 kg/m    Subjective:   Patient ID: Amy Nichols, female    DOB: 1995/08/31, 27 y.o.   MRN: [id number]  HPI: Amy Nichols is a 27 y.o. female presenting on 05/15/2022 for Medical Management of Chronic Issues (CPE with pap and breast exam)   HPI Well woman exam Patient denies any chest pain, shortness of breath, headaches or vision issues, abdominal complaints, diarrhea, nausea, vomiting, or joint issues.  She is sexually active with relationship with a female girlfriend for 3 years.  She does not feel like she has risk for STDs and is not concerned about that today.  Patient does smoke and she does want to quit but she says it is more anxiety driven and she would like to treat her anxiety first.  Hypertension Patient is currently on try for diet control, and their blood pressure today is 137/88. Patient denies any lightheadedness or dizziness. Patient denies headaches, blurred vision, chest pains, shortness of breath, or weakness. Denies any side effects from medication and is content with current medication.   Hyperlipidemia Patient is coming in for recheck of his hyperlipidemia. The patient is currently taking no medicine currently. They deny any issues with myalgias or history of liver damage from it. They deny any focal numbness or weakness or chest pain.   Relevant past medical, surgical, family and social history reviewed and updated as indicated. Interim medical history since our last visit reviewed. Allergies and medications reviewed and updated.  Review of Systems  Constitutional:  Negative for chills and fever.  HENT:  Negative for congestion, ear discharge, ear pain and tinnitus.   Eyes:  Negative for pain, redness and visual disturbance.  Respiratory:  Negative for cough, chest tightness, shortness of breath and wheezing.   Cardiovascular:  Negative  for chest pain, palpitations and leg swelling.  Gastrointestinal:  Negative for abdominal pain, blood in stool, constipation and diarrhea.  Genitourinary:  Negative for difficulty urinating, dysuria and hematuria.  Musculoskeletal:  Negative for back pain, gait problem and myalgias.  Skin:  Negative for rash.  Neurological:  Negative for dizziness, weakness, light-headedness and headaches.  Psychiatric/Behavioral:  Positive for dysphoric mood. Negative for agitation, behavioral problems, sleep disturbance and suicidal ideas. The patient is nervous/anxious.   All other systems reviewed and are negative.   Per HPI unless specifically indicated above   Allergies as of 05/15/2022   No Known Allergies      Medication List        Accurate as of May 15, 2022  1:27 PM. If you have any questions, ask your nurse or doctor.          STOP taking these medications    doxycycline 100 MG capsule Commonly known as: VIBRAMYCIN Stopped by: Fransisca Kaufmann Vendetta Pittinger, MD   sertraline 100 MG tablet Commonly known as: ZOLOFT Stopped by: Worthy Rancher, MD       TAKE these medications    albuterol 108 (90 Base) MCG/ACT inhaler Commonly known as: VENTOLIN HFA Inhale 2 puffs into the lungs every 6 (six) hours as needed for wheezing or shortness of breath.   KYLEENA IU by Intrauterine route.   omeprazole 40 MG capsule Commonly known as: PRILOSEC Take 1 capsule (40 mg total) by mouth 2 (two) times daily before a  meal. What changed: additional instructions Changed by: [name] Shebra Muldrow, MD   PARoxetine 20 MG tablet Commonly known as: Paxil Take 1 tablet (20 mg total) by mouth daily. Started by: [name] Shalyn Koral, MD   traZODone 50 MG tablet Commonly known as: DESYREL TAKE 0.5-1 TABLETS BY MOUTH AT BEDTIME AS NEEDED FOR SLEEP. What changed: See the new instructions.         Objective:   BP 137/88   Pulse 100   Temp 98.4 F (36.9 C)   Ht 5\' 9"  (1.753 m)   Wt 275 lb  (124.7 kg)   SpO2 100%   BMI 40.61 kg/m   Wt Readings from Last 3 Encounters:  05/15/22 275 lb (124.7 kg)  01/01/22 268 lb (121.6 kg)  12/02/21 272 lb 14.9 oz (123.8 kg)    Physical Exam Vitals reviewed.  Constitutional:      General: She is not in acute distress.    Appearance: She is well-developed. She is not diaphoretic.  Eyes:     Conjunctiva/sclera: Conjunctivae normal.  Neck:     Thyroid: No thyromegaly.  Cardiovascular:     Rate and Rhythm: Normal rate and regular rhythm.     Heart sounds: Normal heart sounds. No murmur heard. Pulmonary:     Effort: Pulmonary effort is normal. No respiratory distress.     Breath sounds: Normal breath sounds. No wheezing.  Chest:  Breasts:    Breasts are symmetrical.     Right: No inverted nipple, mass, nipple discharge, skin change or tenderness.     Left: No inverted nipple, mass, nipple discharge, skin change or tenderness.  Abdominal:     General: Bowel sounds are normal. There is no distension.     Palpations: Abdomen is soft.     Tenderness: There is no abdominal tenderness. There is no guarding or rebound.  Genitourinary:    Exam position: Supine.     Labia:        Right: No rash or lesion.        Left: No rash or lesion.      Vagina: Normal.     Cervix: No cervical motion tenderness, discharge or friability.     Uterus: Not deviated, not enlarged, not fixed and not tender.      Adnexa:        Right: No mass or tenderness.         Left: No mass or tenderness.    Musculoskeletal:        General: No swelling. Normal range of motion.     Cervical back: Neck supple.  Lymphadenopathy:     Cervical: No cervical adenopathy.  Skin:    General: Skin is warm and dry.     Findings: No rash.  Neurological:     Mental Status: She is alert and oriented to person, place, and time.     Coordination: Coordination normal.  Psychiatric:        Mood and Affect: Mood is anxious. Mood is not depressed.        Behavior: Behavior  normal.        Thought Content: Thought content does not include suicidal ideation. Thought content does not include suicidal plan.       Assessment & Plan:   Problem List Items Addressed This Visit       Cardiovascular and Mediastinum   Essential hypertension   Relevant Orders   CMP14+EGFR   Lipid panel     Other   Hypertriglyceridemia  Morbid obesity ([place])   Relevant Orders   Lipid panel   TSH   Other Visit Diagnoses     Well woman exam with routine gynecological exam    -  Primary   Relevant Orders   Cytology - PAP([place])   CBC with Differential/Platelet   CMP14+EGFR   Lipid panel   TSH   IUD (intrauterine device) in place       Anxiety       Relevant Medications   PARoxetine (PAXIL) 20 MG tablet       Will start Paxil to see how she does with the anxiety.  Will have her follow-up in a month for that. Follow up plan: Return if symptoms worsen or fail to improve, for 1 to 58-month anxiety recheck.  Counseling provided for all of the vaccine components Orders Placed This Encounter  Procedures   CBC with Differential/Platelet   CMP14+EGFR   Lipid panel   TSH    Caryl Pina, MD [facility] 05/15/2022, 1:27 PM

## 2022-05-16 LAB — TSH: TSH: 1.33 u[IU]/mL (ref 0.450–4.500)

## 2022-05-16 LAB — CMP14+EGFR
ALT: 19 IU/L (ref 0–32)
AST: 16 IU/L (ref 0–40)
Albumin/Globulin Ratio: 1.5 (ref 1.2–2.2)
Albumin: 4.6 g/dL (ref 4.0–5.0)
Alkaline Phosphatase: 60 IU/L (ref 44–121)
BUN/Creatinine Ratio: 9 (ref 9–23)
BUN: 7 mg/dL (ref 6–20)
Bilirubin Total: 0.5 mg/dL (ref 0.0–1.2)
CO2: 21 mmol/L (ref 20–29)
Calcium: 9.3 mg/dL (ref 8.7–10.2)
Chloride: 105 mmol/L (ref 96–106)
Creatinine, Ser: 0.74 mg/dL (ref 0.57–1.00)
Globulin, Total: 3.1 g/dL (ref 1.5–4.5)
Glucose: 84 mg/dL (ref 70–99)
Potassium: 4.1 mmol/L (ref 3.5–5.2)
Sodium: 139 mmol/L (ref 134–144)
Total Protein: 7.7 g/dL (ref 6.0–8.5)
eGFR: 113 mL/min/{1.73_m2} (ref >59)

## 2022-05-16 LAB — CBC WITH DIFFERENTIAL/PLATELET
Basophils Absolute: 0 10*3/uL (ref 0.0–0.2)
Basos: 0 %
EOS (ABSOLUTE): 0 10*3/uL (ref 0.0–0.4)
Eos: 0 %
Hematocrit: 42.2 % (ref 34.0–46.6)
Hemoglobin: 13.7 g/dL (ref 11.1–15.9)
Immature Grans (Abs): 0 10*3/uL (ref 0.0–0.1)
Immature Granulocytes: 0 %
Lymphocytes Absolute: 2 10*3/uL (ref 0.7–3.1)
Lymphs: 31 %
MCH: 28.6 pg (ref 26.6–33.0)
MCHC: 32.5 g/dL (ref 31.5–35.7)
MCV: 88 fL (ref 79–97)
Monocytes Absolute: 0.5 10*3/uL (ref 0.1–0.9)
Monocytes: 7 %
Neutrophils Absolute: 4 10*3/uL (ref 1.4–7.0)
Neutrophils: 62 %
Platelets: 145 10*3/uL — ABNORMAL LOW (ref 150–450)
RBC: 4.79 x10E6/uL (ref 3.77–5.28)
RDW: 13.2 % (ref 11.7–15.4)
WBC: 6.5 10*3/uL (ref 3.4–10.8)

## 2022-05-16 LAB — LIPID PANEL
Chol/HDL Ratio: 3.5 ratio (ref 0.0–4.4)
Cholesterol, Total: 171 mg/dL (ref 100–199)
HDL: 49 mg/dL (ref >39)
LDL Chol Calc (NIH): 100 mg/dL — ABNORMAL HIGH (ref 0–99)
Triglycerides: 125 mg/dL (ref 0–149)
VLDL Cholesterol Cal: 22 mg/dL (ref 5–40)

## 2022-05-20 LAB — CYTOLOGY - PAP: Diagnosis: NEGATIVE

## 2022-05-24 ENCOUNTER — Encounter: Payer: Self-pay | Admitting: Family Medicine

## 2022-05-31 ENCOUNTER — Telehealth: Payer: Medicaid Other

## 2022-06-01 ENCOUNTER — Telehealth: Payer: Medicaid Other | Admitting: Family Medicine

## 2022-06-01 DIAGNOSIS — B370 Candidal stomatitis: Secondary | ICD-10-CM

## 2022-06-01 MED ORDER — NYSTATIN 100000 UNIT/ML MT SUSP: 5.0000 mL | Freq: Four times a day (QID) | OROMUCOSAL | 0 refills | Status: DC

## 2022-06-01 NOTE — Progress Notes (Signed)
  E Visit for Rash  We are sorry that you are not feeling well. Here is how we plan to help!   Based upon your presentation it appears you have a fungal infection.  I have prescribed: Nystatin Swish- please follow directions on the bottle.   HOME CARE:  Brush teeth often Drink water after eating Use medication as directed  GET HELP RIGHT AWAY IF:  Symptoms don't go away after treatment. You develop a fever. You have a sore throat. You become short of breath.  MAKE SURE YOU:  Understand these instructions. Will watch your condition. Will get help right away if you are not doing well or get worse.  Thank you for choosing an e-visit.  Your e-visit answers were reviewed by a board certified advanced clinical practitioner to complete your personal care plan. Depending upon the condition, your plan could have included both over the counter or prescription medications.  Please review your pharmacy choice. Make sure the pharmacy is open so you can pick up prescription now. If there is a problem, you may contact your provider through [employer] and have the prescription routed to another pharmacy.  Your safety is important to [name]. If you have drug allergies check your prescription carefully.   For the next 24 hours you can use MyChart to ask questions about today's visit, request a non-urgent call back, or ask for a work or school excuse. You will get an email in the next two days asking about your experience. I hope that your e-visit has been valuable and will speed your recovery.     I provided 5 minutes of non face-to-face time during this encounter for chart review, medication and order placement, as well as and documentation.

## 2022-06-05 IMAGING — DX DG CHEST 2V
2 series · 2 of 2 positions shown · non-contrast
Comparison: PA and lateral 03/07/2021

CLINICAL DATA: Shortness of breath.  Coughing and fever.

EXAM:
CHEST - 2 VIEW

[chest pa]
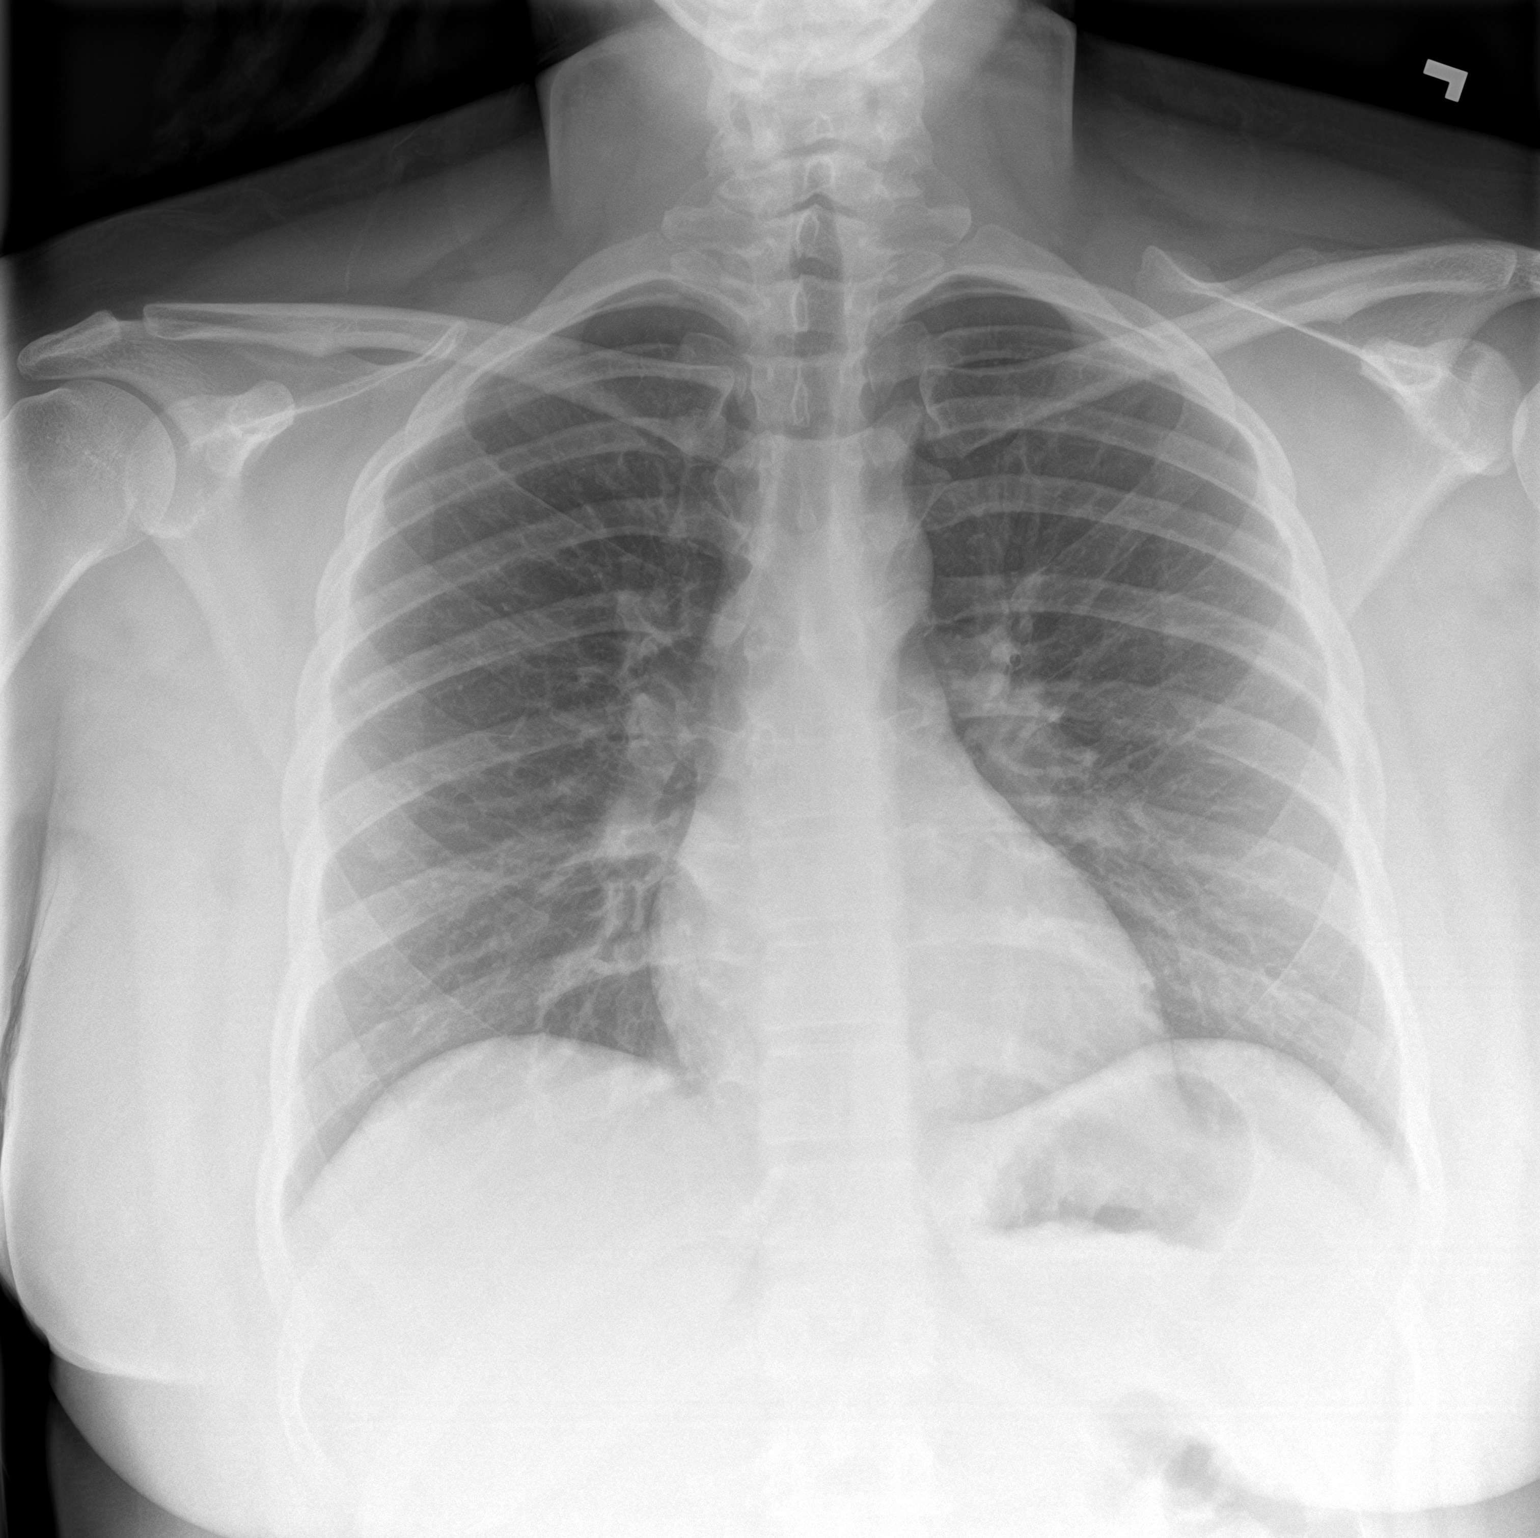

[chest lat]
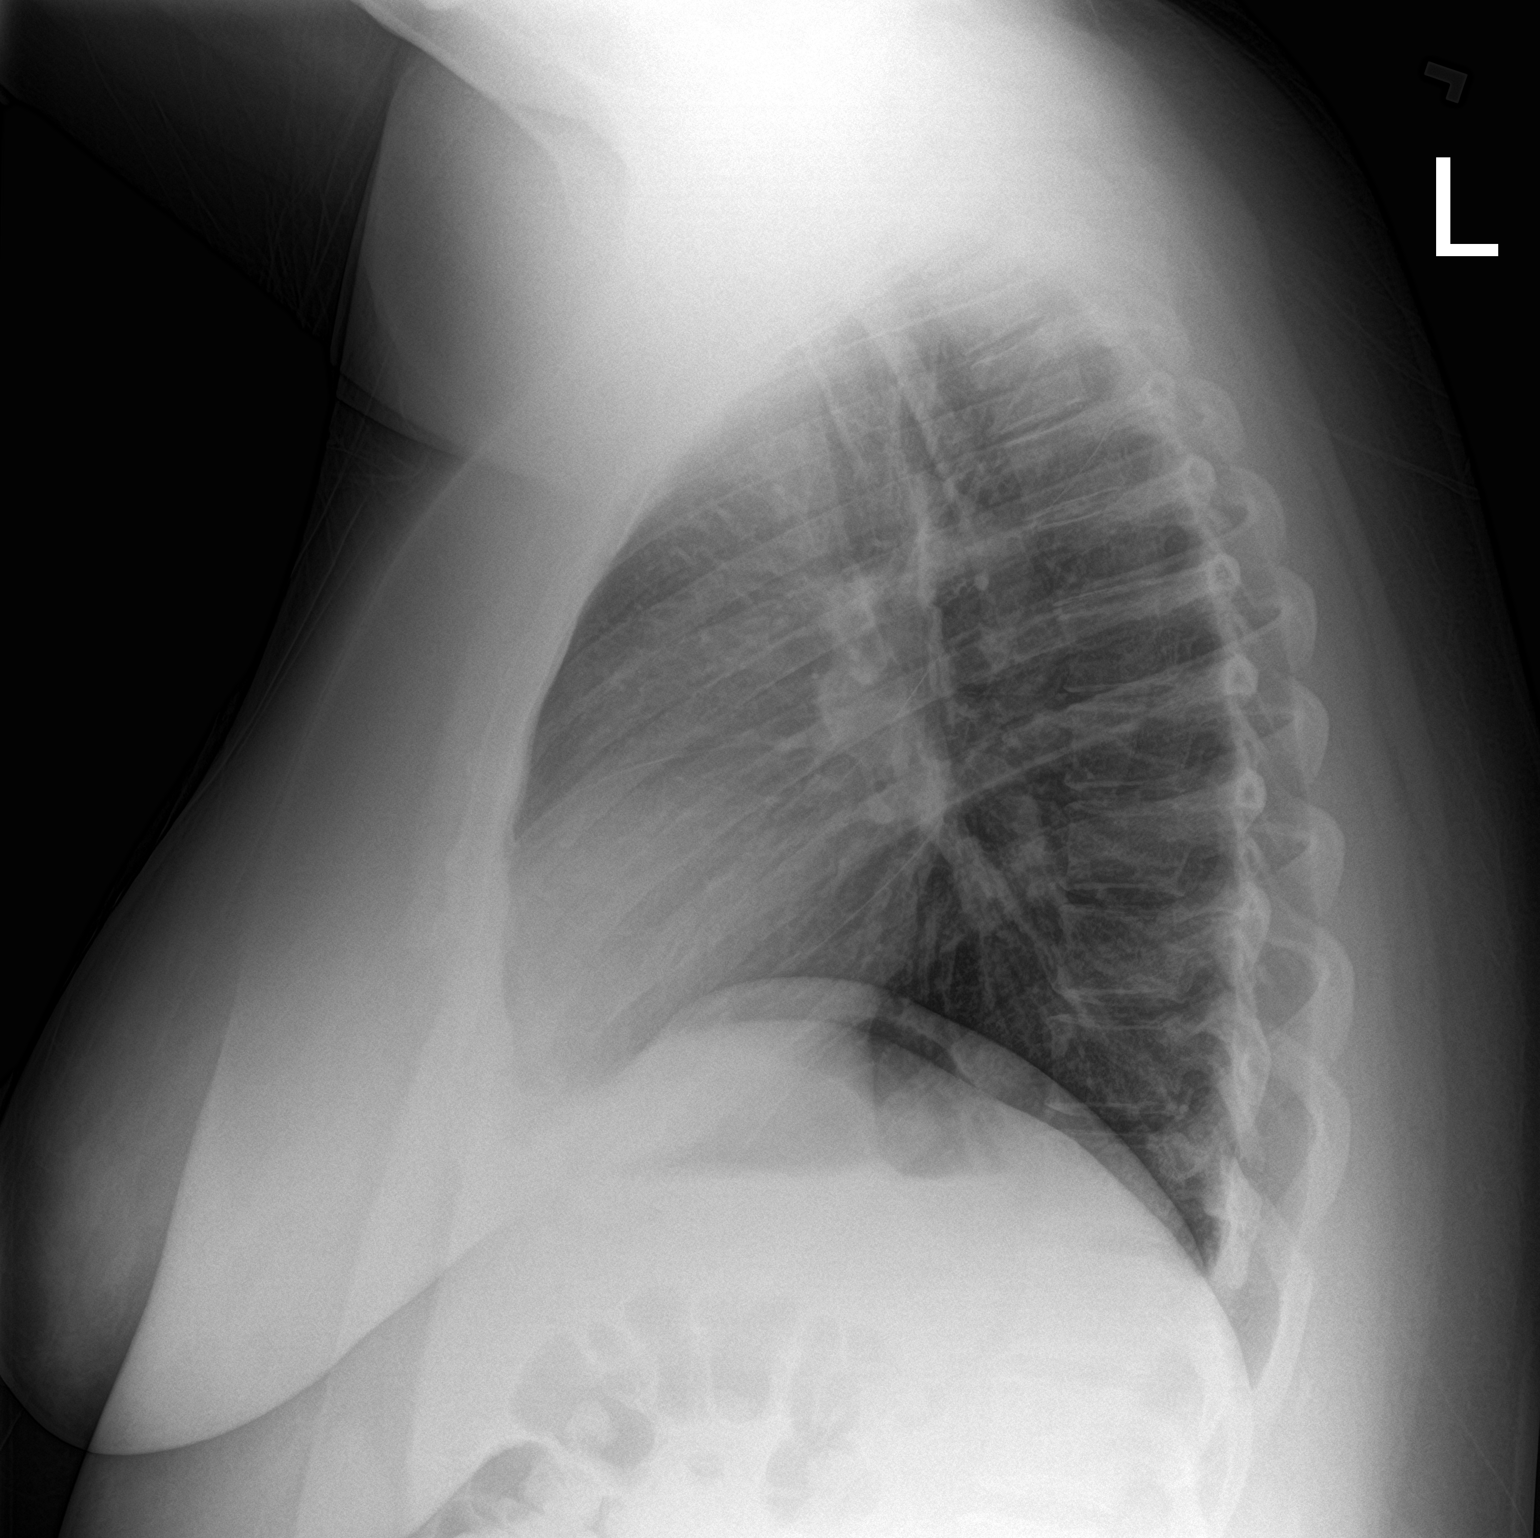

[2 of 2 positions shown; findings below may reference images not displayed]

FINDINGS: The heart size and mediastinal contours are within normal limits.
Both lungs are clear. The visualized skeletal structures are
unremarkable.
IMPRESSION: No active cardiopulmonary disease or interval changes.

## 2022-06-09 ENCOUNTER — Other Ambulatory Visit: Payer: Self-pay | Admitting: Family Medicine

## 2022-06-09 DIAGNOSIS — F419 Anxiety disorder, unspecified: Secondary | ICD-10-CM

## 2022-06-10 ENCOUNTER — Telehealth: Payer: Medicaid Other | Admitting: Physician Assistant

## 2022-06-10 ENCOUNTER — Encounter: Payer: Self-pay | Admitting: Family Medicine

## 2022-06-10 DIAGNOSIS — R3989 Other symptoms and signs involving the genitourinary system: Secondary | ICD-10-CM

## 2022-06-11 MED ORDER — CEPHALEXIN 500 MG PO CAPS: 500.0000 mg | ORAL_CAPSULE | Freq: Two times a day (BID) | ORAL | 0 refills | Status: AC

## 2022-06-11 NOTE — Progress Notes (Signed)

## 2022-06-11 NOTE — Progress Notes (Signed)
I have spent 5 minutes in review of e-visit questionnaire, review and updating patient chart, medical decision making and response to patient.   Amy Nichols Hayley Horn, PA-C    

## 2022-06-12 ENCOUNTER — Other Ambulatory Visit: Payer: Self-pay | Admitting: Family Medicine

## 2022-06-12 DIAGNOSIS — F5104 Psychophysiologic insomnia: Secondary | ICD-10-CM

## 2022-06-12 DIAGNOSIS — F419 Anxiety disorder, unspecified: Secondary | ICD-10-CM

## 2022-06-12 NOTE — Telephone Encounter (Signed)
Last office visit 05/15/22 Last refill 09/15/21, #90, 1 refill

## 2022-06-15 ENCOUNTER — Encounter: Payer: Self-pay | Admitting: Family Medicine

## 2022-06-15 ENCOUNTER — Telehealth: Payer: Self-pay | Admitting: Family Medicine

## 2022-06-15 ENCOUNTER — Ambulatory Visit: Payer: Medicaid Other | Admitting: Family Medicine

## 2022-06-15 NOTE — Telephone Encounter (Signed)
Yes that is fine, continue the paroxetine 10 mg till she comes in.

## 2022-06-16 NOTE — Telephone Encounter (Signed)
Patient will continue 10 mg until next visit.

## 2022-06-23 ENCOUNTER — Encounter: Payer: Self-pay | Admitting: Family Medicine

## 2022-06-24 ENCOUNTER — Encounter: Payer: Self-pay | Admitting: Family Medicine

## 2022-06-24 ENCOUNTER — Ambulatory Visit (INDEPENDENT_AMBULATORY_CARE_PROVIDER_SITE_OTHER): Payer: Medicaid Other | Admitting: Family Medicine

## 2022-06-24 VITALS — BP 130/81 | HR 89 | Ht 69.0 in | Wt 276.0 lb

## 2022-06-24 DIAGNOSIS — Z30430 Encounter for insertion of intrauterine contraceptive device: Secondary | ICD-10-CM

## 2022-06-24 DIAGNOSIS — R102 Pelvic and perineal pain: Secondary | ICD-10-CM | POA: Diagnosis not present

## 2022-06-24 DIAGNOSIS — Z30433 Encounter for removal and reinsertion of intrauterine contraceptive device: Secondary | ICD-10-CM | POA: Diagnosis not present

## 2022-06-24 DIAGNOSIS — Z30432 Encounter for removal of intrauterine contraceptive device: Secondary | ICD-10-CM

## 2022-06-24 MED ORDER — LEVONORGESTREL 20 MCG/DAY IU IUD
1.0000 | INTRAUTERINE_SYSTEM | Freq: Once | INTRAUTERINE | Status: AC
  Administered 2022-06-24: 1 via INTRAUTERINE

## 2022-06-24 MED ORDER — IBUPROFEN 800 MG PO TABS: 800.0000 mg | ORAL_TABLET | Freq: Three times a day (TID) | ORAL | 1 refills | Status: AC | PRN

## 2022-06-24 NOTE — Progress Notes (Signed)
BP 130/81   Pulse 89   Ht 5' 9"$  (1.753 m)   Wt 276 lb (125.2 kg)   SpO2 100%   BMI 40.76 kg/m    Subjective:   Patient ID: Amy Nichols, female    DOB: [birth year], 27 y.o.   MRN: [id number]  HPI: Amy Nichols is a 27 y.o. female presenting on 06/24/2022 for IUD Check   HPI Patient is coming in today for an IUD check.  She has had her IUD for about 3 years with amoxicillin over the past week.  She felt like she had an issue with it getting stuck on a tampon and pulled part way out and now she is having a lot of pain and inflammation and discomfort in the pelvic region.  She just had a Pap smear and STD testing in January 5.  She denies any vaginal discharge.  She has had some spotting over the past month as well.  She would like a Mirena if she gets this from hold.  It is done well for her until now  Relevant past medical, surgical, family and social history reviewed and updated as indicated. Interim medical history since our last visit reviewed. Allergies and medications reviewed and updated.  Review of Systems  Constitutional:  Negative for chills and fever.  Eyes:  Negative for redness and visual disturbance.  Respiratory:  Negative for chest tightness and shortness of breath.   Cardiovascular:  Negative for chest pain and leg swelling.  Genitourinary:  Positive for pelvic pain and vaginal discharge. Negative for decreased urine volume, difficulty urinating, dysuria, flank pain, frequency, hematuria, urgency and vaginal bleeding.  Musculoskeletal:  Negative for back pain and gait problem.  Skin:  Negative for rash.  Neurological:  Negative for light-headedness and headaches.  Psychiatric/Behavioral:  Negative for agitation and behavioral problems.   All other systems reviewed and are negative.   Per HPI unless specifically indicated above   Allergies as of 06/24/2022   No Known Allergies      Medication List        Accurate as of June 24, 2022  1:53 PM. If  you have any questions, ask your nurse or doctor.          STOP taking these medications    albuterol 108 (90 Base) MCG/ACT inhaler Commonly known as: VENTOLIN HFA Stopped by: Worthy Rancher, MD   Verdia Kuba IU Stopped by: Fransisca Kaufmann Tambi Thole, MD   nystatin 100000 UNIT/ML suspension Commonly known as: MYCOSTATIN Stopped by: Fransisca Kaufmann Nehemyah Foushee, MD       TAKE these medications    ibuprofen 800 MG tablet Commonly known as: ADVIL Take 1 tablet (800 mg total) by mouth every 8 (eight) hours as needed. Started by: Worthy Rancher, MD   omeprazole 40 MG capsule Commonly known as: PRILOSEC Take 1 capsule (40 mg total) by mouth 2 (two) times daily before a meal.   PARoxetine 20 MG tablet Commonly known as: PAXIL TAKE 1 TABLET BY MOUTH EVERY DAY   traZODone 50 MG tablet Commonly known as: DESYREL TAKE 1/2 TO 1 TABLET BY MOUTH AT BEDTIME AS NEEDED FOR SLEEP         Objective:   BP 130/81   Pulse 89   Ht 5' 9"$  (1.753 m)   Wt 276 lb (125.2 kg)   SpO2 100%   BMI 40.76 kg/m   Wt Readings from Last 3 Encounters:  06/24/22 276 lb (125.2 kg)  05/15/22 275 lb (  124.7 kg)  01/01/22 268 lb (121.6 kg)    Physical Exam Vitals and nursing note reviewed. Exam conducted with a chaperone present.  Constitutional:      Appearance: Normal appearance.  Abdominal:     General: Abdomen is flat. Bowel sounds are normal. There is no distension.     Tenderness: There is no abdominal tenderness. There is no guarding.     Hernia: No hernia is present.  Genitourinary:    Exam position: Lithotomy position.     Comments: IUD strings appear to be a little longer but in place.  Do not see any part of the IUD sticking out the cervical opening Neurological:     Mental Status: She is alert.    IUD removal and insertion procedure: Patient was placed in stirrups and use a speculum. Patient was prepped with Betadine swabs using cotton balls. Tenaculum was used to grab the anterior cervix.  Uterine sound was performed and found to be 7cm in length and anteverted. Cervical dilation was necessary. Mirena was placed using factory device at the correct depth that was measured and deployed without issue. The strings were cut leaving 1.5 cm extra. Patient tolerated procedure well and bleeding was minimal.    Assessment & Plan:   Problem List Items Addressed This Visit   None Visit Diagnoses     Pelvic pain    -  Primary   Relevant Medications   ibuprofen (ADVIL) 800 MG tablet   Encounter for IUD removal       Relevant Medications   levonorgestrel (MIRENA) 20 MCG/DAY IUD 1 each (Start on 06/24/2022  2:00 PM)   ibuprofen (ADVIL) 800 MG tablet   Encounter for IUD insertion       Relevant Medications   levonorgestrel (MIRENA) 20 MCG/DAY IUD 1 each (Start on 06/24/2022  2:00 PM)   ibuprofen (ADVIL) 800 MG tablet       Removed Kyleena and patient's pain improved, and probably had dropped down partially to her cervix causing contraction-like pains.  Placed a Mirena.  Patient was already starting to feel better.  Still having some cramping but it was improving. Follow up plan: Return if symptoms worsen or fail to improve, for 4-5 week iud check.  Counseling provided for all of the vaccine components No orders of the defined types were placed in this encounter.   Caryl Pina, MD [facility] 06/24/2022, 1:53 PM

## 2022-07-01 ENCOUNTER — Telehealth: Payer: Medicaid Other | Admitting: Nurse Practitioner

## 2022-07-01 DIAGNOSIS — J40 Bronchitis, not specified as acute or chronic: Secondary | ICD-10-CM | POA: Diagnosis not present

## 2022-07-01 MED ORDER — ALBUTEROL SULFATE HFA 108 (90 BASE) MCG/ACT IN AERS: 2.0000 | INHALATION_SPRAY | Freq: Four times a day (QID) | RESPIRATORY_TRACT | 0 refills | Status: DC | PRN

## 2022-07-01 MED ORDER — BENZONATATE 100 MG PO CAPS: 100.0000 mg | ORAL_CAPSULE | Freq: Three times a day (TID) | ORAL | 0 refills | Status: DC | PRN

## 2022-07-01 MED ORDER — PREDNISONE 20 MG PO TABS: 20.0000 mg | ORAL_TABLET | Freq: Two times a day (BID) | ORAL | 0 refills | Status: AC

## 2022-07-01 NOTE — Progress Notes (Signed)
We are sorry that you are not feeling well.  Here is how we plan to help!  Based on your presentation I believe you most likely have A cough due to a virus.  This is called viral bronchitis and is best treated by rest, plenty of fluids and control of the cough.  You may use Ibuprofen or Tylenol as directed to help your symptoms.     In addition you may use A prescription cough medication called Tessalon Perles 181m. You may take 1-2 capsules every 8 hours as needed for your cough.  Prednisone 20 mg twice daily for 5 days We will also prescribe an inhaler to help with your wheezing   Meds ordered this encounter  Medications   albuterol (VENTOLIN HFA) 108 (90 Base) MCG/ACT inhaler    Sig: Inhale 2 puffs into the lungs every 6 (six) hours as needed for wheezing or shortness of breath.    Dispense:  8 g    Refill:  0   predniSONE (DELTASONE) 20 MG tablet    Sig: Take 1 tablet (20 mg total) by mouth 2 (two) times daily with a meal for 5 days.    Dispense:  10 tablet    Refill:  0   benzonatate (TESSALON) 100 MG capsule    Sig: Take 1 capsule (100 mg total) by mouth 3 (three) times daily as needed.    Dispense:  30 capsule    Refill:  0    From your responses in the eVisit questionnaire you describe inflammation in the upper respiratory tract which is causing a significant cough.  This is commonly called Bronchitis and has four common causes:   Allergies Viral Infections Acid Reflux Bacterial Infection Allergies, viruses and acid reflux are treated by controlling symptoms or eliminating the cause. An example might be a cough caused by taking certain blood pressure medications. You stop the cough by changing the medication. Another example might be a cough caused by acid reflux. Controlling the reflux helps control the cough.  USE OF BRONCHODILATOR ("RESCUE") INHALERS: There is a risk from using your bronchodilator too frequently.  The risk is that over-reliance on a medication which only  relaxes the muscles surrounding the breathing tubes can reduce the effectiveness of medications prescribed to reduce swelling and congestion of the tubes themselves.  Although you feel brief relief from the bronchodilator inhaler, your asthma may actually be worsening with the tubes becoming more swollen and filled with mucus.  This can delay other crucial treatments, such as oral steroid medications. If you need to use a bronchodilator inhaler daily, several times per day, you should discuss this with your provider.  There are probably better treatments that could be used to keep your asthma under control.     HOME CARE Only take medications as instructed by your medical team. Complete the entire course of an antibiotic. Drink plenty of fluids and get plenty of rest. Avoid close contacts especially the very young and the elderly Cover your mouth if you cough or cough into your sleeve. Always remember to wash your hands A steam or ultrasonic humidifier can help congestion.   GET HELP RIGHT AWAY IF: You develop worsening fever. You become short of breath You cough up blood. Your symptoms persist after you have completed your treatment plan MAKE SURE YOU  Understand these instructions. Will watch your condition. Will get help right away if you are not doing well or get worse.    Thank you for choosing an  e-visit.  Your e-visit answers were reviewed by a board certified advanced clinical practitioner to complete your personal care plan. Depending upon the condition, your plan could have included both over the counter or prescription medications.  Please review your pharmacy choice. Make sure the pharmacy is open so you can pick up prescription now. If there is a problem, you may contact your provider through [employer] and have the prescription routed to another pharmacy.  Your safety is important to [name]. If you have drug allergies check your prescription carefully.   For the next 24  hours you can use MyChart to ask questions about today's visit, request a non-urgent call back, or ask for a work or school excuse. You will get an email in the next two days asking about your experience. I hope that your e-visit has been valuable and will speed your recovery.   I spent approximately 5 minutes reviewing the patient's history, current symptoms and coordinating their care today.

## 2022-07-20 ENCOUNTER — Ambulatory Visit (INDEPENDENT_AMBULATORY_CARE_PROVIDER_SITE_OTHER): Payer: Medicaid Other | Admitting: Family Medicine

## 2022-07-20 ENCOUNTER — Encounter: Payer: Self-pay | Admitting: Family Medicine

## 2022-07-20 VITALS — BP 133/87 | HR 71 | Ht 69.0 in | Wt 281.0 lb

## 2022-07-20 DIAGNOSIS — F419 Anxiety disorder, unspecified: Secondary | ICD-10-CM

## 2022-07-20 DIAGNOSIS — M25561 Pain in right knee: Secondary | ICD-10-CM

## 2022-07-20 MED ORDER — DULOXETINE HCL 30 MG PO CPEP: 30.0000 mg | ORAL_CAPSULE | Freq: Every day | ORAL | 2 refills | Status: DC

## 2022-07-20 NOTE — Patient Instructions (Signed)
Recommended Voltaren gel for arthralgia

## 2022-07-20 NOTE — Progress Notes (Signed)
BP 133/87   Pulse 71   Ht '5\' 9"'$  (1.753 m)   Wt 127.5 kg   SpO2 98%   BMI 41.50 kg/m    Subjective:   Patient ID: Amy Nichols, female    DOB: 09-29-1995, 27 y.o.   MRN: [id number]  HPI: Amy Nichols is a 27 y.o. female presenting on 07/20/2022 for Medical Management of Chronic Issues, Anxiety (Does not like Paxil. Taking '10mg'$  daily. Causes HA, increased HA, nausea), Knee Pain, and Elbow Pain (? arthritis)  Patient is coming in for an anxiety recheck. Patient is currently taking Paxil 10 mg qd. Patient states the medication is calming her anxiety but is making her hyperactive, especially when trying to sleep, as well as increasing her appetite, and giving her daily headaches in the morning. Patient has tried Zoloft and Wellbutrin in the past and d/c these meds due to side effects. Patient desires to try a different medication.   Patient is having pain in her right elbow, right middle finger, and knee that is worse in the morning upon awakening and with flexion and extension of the joints. This has been present for 2 months. Patient states she has a history of arthritis and a family history as well. Patient states she is using Ibuprofen 800 mg once daily at bedtime, Tylenol arthritis tablets q8h, and Biofreeze with some relief. Patient is also using warming blankets. Patient describes her pain as 7/10.   Relevant past medical, surgical, family and social history were reviewed and updated as indicated. Interim medical history were reviewed. Allergies and medications were reviewed and updated.  Review of Systems  Constitutional:  Positive for appetite change. Negative for chills and fever.  Respiratory:  Negative for chest tightness and shortness of breath.   Cardiovascular:  Negative for chest pain and leg swelling.  Musculoskeletal:  Positive for arthralgias. Negative for back pain, joint swelling and myalgias.  Skin:  Negative for rash.  Neurological:  Positive for headaches.  Negative for dizziness, weakness and light-headedness.  Psychiatric/Behavioral:  Positive for sleep disturbance. Negative for agitation, behavioral problems, dysphoric mood, self-injury and suicidal ideas. The patient is hyperactive. The patient is not nervous/anxious.     Per HPI unless specifically indicated above.   Allergies as of 07/20/2022   No Known Allergies      Medication List        Accurate as of July 20, 2022 11:49 AM. If you have any questions, ask your nurse or doctor.          STOP taking these medications    PARoxetine 20 MG tablet Commonly known as: PAXIL Stopped by: Fransisca Kaufmann Cordera Stineman, MD       TAKE these medications    albuterol 108 (90 Base) MCG/ACT inhaler Commonly known as: VENTOLIN HFA Inhale 2 puffs into the lungs every 6 (six) hours as needed for wheezing or shortness of breath.   benzonatate 100 MG capsule Commonly known as: TESSALON Take 1 capsule (100 mg total) by mouth 3 (three) times daily as needed.   DULoxetine 30 MG capsule Commonly known as: Cymbalta Take 1 capsule (30 mg total) by mouth daily. Started by: Worthy Rancher, MD   ibuprofen 800 MG tablet Commonly known as: ADVIL Take 1 tablet (800 mg total) by mouth every 8 (eight) hours as needed.   omeprazole 40 MG capsule Commonly known as: PRILOSEC Take 1 capsule (40 mg total) by mouth 2 (two) times daily before a meal.   traZODone 50  MG tablet Commonly known as: DESYREL TAKE 1/2 TO 1 TABLET BY MOUTH AT BEDTIME AS NEEDED FOR SLEEP         Objective:   BP 133/87   Pulse 71   Ht 5\' 9"  (1.753 m)   Wt 127.5 kg   SpO2 98%   BMI 41.50 kg/m   Wt Readings from Last 3 Encounters:  07/20/22 127.5 kg  06/24/22 125.2 kg  05/15/22 124.7 kg    Physical Exam Vitals reviewed.  Constitutional:      General: She is not in acute distress.    Appearance: Normal appearance. She is not diaphoretic.  Eyes:     General: No scleral icterus.    Conjunctiva/sclera:  Conjunctivae normal.  Neck:     Thyroid: No thyromegaly.  Cardiovascular:     Rate and Rhythm: Normal rate and regular rhythm.     Heart sounds: Normal heart sounds.  Pulmonary:     Effort: Pulmonary effort is normal.     Breath sounds: Normal breath sounds.  Musculoskeletal:     Right elbow: No swelling, deformity or effusion. No tenderness.     Left elbow: No swelling, deformity or effusion. No tenderness.     Right wrist: Normal. No swelling, deformity or tenderness. Normal pulse.     Left wrist: Normal. No swelling, deformity or tenderness. Normal pulse.     Right hand: Normal. No swelling, deformity or tenderness.     Left hand: Normal. No swelling, deformity or tenderness.     Cervical back: Neck supple. No tenderness.     Right knee: No swelling, deformity or effusion. No tenderness.     Left knee: Normal. No swelling, deformity or effusion. No tenderness.     Right lower leg: No edema.     Left lower leg: No edema.  Lymphadenopathy:     Cervical: No cervical adenopathy.  Skin:    General: Skin is warm and dry.  Neurological:     Mental Status: She is alert and oriented to person, place, and time.     Gait: Gait normal.  Psychiatric:        Mood and Affect: Mood is anxious and depressed.        Behavior: Behavior normal.        Thought Content: Thought content normal.        Judgment: Judgment normal.     Assessment & Plan:   Problem List Items Addressed This Visit   None Visit Diagnoses     Anxiety    -  Primary   Relevant Medications   DULoxetine (CYMBALTA) 30 MG capsule   Arthralgia of right knee          Patient instructed to continue taking Tylenol and Ibuprofen for arthritic pain. Patient told to purchase OTC Voltaren gel and use 3-4x daily on joints.  Patient will start Duloxetine. Patient was made aware of side effects including drowsiness. Patient was also told Duloxetine may help with her pain as well.   Follow up plan: Return if symptoms worsen  or fail to improve, for 1 to 98-month anxiety recheck.  Counseling provided for all of the vaccine components No orders of the defined types were placed in this encounter.  [place], PA-S2 [facility] 07/20/2022, 11:49 AM  I was personally present for all components of the history, physical exam and/or medical decision making.  I agree with the documentation performed by the PA student and agree with assessment and plan above.  PA  student.   Vonna Kotyk [facility]ideo [facility] [employer] 07/20/2022, 11:49 AM

## 2022-08-10 ENCOUNTER — Other Ambulatory Visit: Payer: Self-pay | Admitting: Nurse Practitioner

## 2022-08-10 DIAGNOSIS — J40 Bronchitis, not specified as acute or chronic: Secondary | ICD-10-CM

## 2022-08-12 ENCOUNTER — Telehealth: Payer: Medicaid Other | Admitting: Physician Assistant

## 2022-08-12 DIAGNOSIS — R051 Acute cough: Secondary | ICD-10-CM

## 2022-08-12 DIAGNOSIS — J301 Allergic rhinitis due to pollen: Secondary | ICD-10-CM

## 2022-08-12 MED ORDER — CETIRIZINE HCL 10 MG PO TABS: 10.0000 mg | ORAL_TABLET | Freq: Every day | ORAL | 0 refills | Status: DC

## 2022-08-12 MED ORDER — PROMETHAZINE-DM 6.25-15 MG/5ML PO SYRP: 5.0000 mL | ORAL_SOLUTION | Freq: Four times a day (QID) | ORAL | 0 refills | Status: DC | PRN

## 2022-08-12 MED ORDER — LIDOCAINE VISCOUS HCL 2 % MT SOLN: OROMUCOSAL | 0 refills | Status: DC

## 2022-08-12 MED ORDER — FLUTICASONE PROPIONATE 50 MCG/ACT NA SUSP: 2.0000 | Freq: Every day | NASAL | 0 refills | Status: DC

## 2022-08-12 NOTE — Progress Notes (Signed)
E visit for Allergic Rhinitis We are sorry that you are not feeling well.  Here is how we plan to help!  Based on what you have shared with me it looks like you have Allergic Rhinitis.  Rhinitis is when a reaction occurs that causes nasal congestion, runny nose, sneezing, and itching.  Most types of rhinitis are caused by an inflammation and are associated with symptoms in the eyes ears or throat. There are several types of rhinitis.  The most common are acute rhinitis, which is usually caused by a viral illness, allergic or seasonal rhinitis, and nonallergic or year-round rhinitis.  Nasal allergies occur certain times of the year.  Allergic rhinitis is caused when allergens in the air trigger the release of histamine in the body.  Histamine causes itching, swelling, and fluid to build up in the fragile linings of the nasal passages, sinuses and eyelids.  An itchy nose and clear discharge are common.  I recommend the following over the counter treatments: You should take a daily dose of antihistamine, Cetirizine 10mg  Take one tablet daily for 14 days has been prescribed  I also would recommend a nasal spray: Flonase 2 sprays into each nostril once daily for 14 days for sore throat and drainage has been prescribed  Promethazine DM Cough Syrup Take 9mL every 6 hours as needed for cough.   HOME CARE:  You can use an over-the-counter saline nasal spray as needed Avoid areas where there is heavy dust, mites, or molds Stay indoors on windy days during the pollen season Keep windows closed in home, at least in bedroom; use air conditioner. Use high-efficiency house air filter Keep windows closed in car, turn AC on re-circulate Avoid playing out with dog during pollen season  GET HELP RIGHT AWAY IF:  If your symptoms do not improve within 10 days You become short of breath You develop yellow or green discharge from your nose for over 3 days You have coughing fits  MAKE SURE YOU:  Understand  these instructions Will watch your condition Will get help right away if you are not doing well or get worse  Thank you for choosing an e-visit. Your e-visit answers were reviewed by a board certified advanced clinical practitioner to complete your personal care plan. Depending upon the condition, your plan could have included both over the counter or prescription medications. Please review your pharmacy choice. Be sure that the pharmacy you have chosen is open so that you can pick up your prescription now.  If there is a problem you may message your provider in [place] to have the prescription routed to another pharmacy. Your safety is important to [name]. If you have drug allergies check your prescription carefully.  For the next 24 hours, you can use MyChart to ask questions about today's visit, request a non-urgent call back, or ask for a work or school excuse from your e-visit provider. You will get an email in the next two days asking about your experience. I hope that your e-visit has been valuable and will speed your recovery.   I have spent 5 minutes in review of e-visit questionnaire, review and updating patient chart, medical decision making and response to patient.   Mar Daring, PA-C

## 2022-08-12 NOTE — Addendum Note (Signed)
Addended by: Mar Daring on: 08/12/2022 03:00 PM   Modules accepted: Orders

## 2022-08-13 ENCOUNTER — Other Ambulatory Visit: Payer: Self-pay | Admitting: Family Medicine

## 2022-08-13 DIAGNOSIS — F419 Anxiety disorder, unspecified: Secondary | ICD-10-CM

## 2022-08-14 ENCOUNTER — Telehealth: Payer: Medicaid Other | Admitting: Nurse Practitioner

## 2022-08-14 DIAGNOSIS — J301 Allergic rhinitis due to pollen: Secondary | ICD-10-CM

## 2022-08-15 NOTE — Progress Notes (Signed)
E visit for Allergic Rhinitis We are sorry that you are not feeling well.  Here is how we plan to help!  Based on what you have shared with me it looks like you have Allergic Rhinitis.  Rhinitis is when a reaction occurs that causes nasal congestion, runny nose, sneezing, and itching.  Most types of rhinitis are caused by an inflammation and are associated with symptoms in the eyes ears or throat. There are several types of rhinitis.  The most common are acute rhinitis, which is usually caused by a viral illness, allergic or seasonal rhinitis, and nonallergic or year-round rhinitis.  Nasal allergies occur certain times of the year.  Allergic rhinitis is caused when allergens in the air trigger the release of histamine in the body.  Histamine causes itching, swelling, and fluid to build up in the fragile linings of the nasal passages, sinuses and eyelids.  An itchy nose and clear discharge are common.  I recommend the following over the counter treatments: You should take a daily dose of antihistamine- zyrtec was prescribed 2 days ago  I also would recommend a nasal spray: Flonase 2 sprays into each nostril once daily- prescribed 2 days  ago  You may also benefit from eye drops such as: Systane 1-2 driops each eye twice daily as needed  If you are not improving you will need a face to face visit. It takes more than 2 days for the medicaine  to get in your system and work well.  HOME CARE:  You can use an over-the-counter saline nasal spray as needed Avoid areas where there is heavy dust, mites, or molds Stay indoors on windy days during the pollen season Keep windows closed in home, at least in bedroom; use air conditioner. Use high-efficiency house air filter Keep windows closed in car, turn AC on re-circulate Avoid playing out with dog during pollen season  GET HELP RIGHT AWAY IF:  If your symptoms do not improve within 10 days You become short of breath You develop yellow or green  discharge from your nose for over 3 days You have coughing fits  MAKE SURE YOU:  Understand these instructions Will watch your condition Will get help right away if you are not doing well or get worse  Thank you for choosing an e-visit. Your e-visit answers were reviewed by a board certified advanced clinical practitioner to complete your personal care plan. Depending upon the condition, your plan could have included both over the counter or prescription medications. Please review your pharmacy choice. Be sure that the pharmacy you have chosen is open so that you can pick up your prescription now.  If there is a problem you may message your provider in MyChart to have the prescription routed to another pharmacy. Your safety is important to [name]. If you have drug allergies check your prescription carefully.  For the next 24 hours, you can use MyChart to ask questions about today's visit, request a non-urgent call back, or ask for a work or school excuse from your e-visit provider. You will get an email in the next two days asking about your experience. I hope that your e-visit has been valuable and will speed your recovery.  Mary-Margaret Daphine Deutscher, FNP   5-10 minutes spent reviewing and documenting in chart.

## 2022-08-18 ENCOUNTER — Telehealth: Payer: Medicaid Other | Admitting: Physician Assistant

## 2022-08-18 DIAGNOSIS — L0591 Pilonidal cyst without abscess: Secondary | ICD-10-CM

## 2022-08-18 DIAGNOSIS — L0501 Pilonidal cyst with abscess: Secondary | ICD-10-CM | POA: Diagnosis not present

## 2022-08-18 MED ORDER — SULFAMETHOXAZOLE-TRIMETHOPRIM 800-160 MG PO TABS: 1.0000 | ORAL_TABLET | Freq: Two times a day (BID) | ORAL | 0 refills | Status: DC

## 2022-08-18 NOTE — Progress Notes (Signed)
E Visit for Cellulitis/Pilonidal Cyst  We are sorry that you are not feeling well. Here is how we plan to help!  Based on what you shared with me it looks like you have cellulitis.  Cellulitis looks like areas of skin redness, swelling, and warmth; it develops as a result of bacteria entering under the skin. Little red spots and/or bleeding can be seen in skin, and tiny surface sacs containing fluid can occur. Fever can be present. Cellulitis is almost always on one side of a body, and the lower limbs are the most common site of involvement.   I have prescribed:  Bactrim DS 1 tablet by mouth twice a day for 7 days  HOME CARE:  Take your medications as ordered and take all of them, even if the skin irritation appears to be healing.   GET HELP RIGHT AWAY IF:  Symptoms that don't begin to go away within 48 hours. Severe redness persists or worsens If the area turns color, spreads or swells. If it blisters and opens, develops yellow-brown crust or bleeds. You develop a fever or chills. If the pain increases or becomes unbearable.  Are unable to keep fluids and food down.  MAKE SURE YOU   Understand these instructions. Will watch your condition. Will get help right away if you are not doing well or get worse.  Thank you for choosing an e-visit.  Your e-visit answers were reviewed by a board certified advanced clinical practitioner to complete your personal care plan. Depending upon the condition, your plan could have included both over the counter or prescription medications.  Please review your pharmacy choice. Make sure the pharmacy is open so you can pick up prescription now. If there is a problem, you may contact your provider through [employer] and have the prescription routed to another pharmacy.  Your safety is important to [name]. If you have drug allergies check your prescription carefully.   For the next 24 hours you can use MyChart to ask questions about today's visit,  request a non-urgent call back, or ask for a work or school excuse. You will get an email in the next two days asking about your experience. I hope that your e-visit has been valuable and will speed your recovery.  I have spent 5 minutes in review of e-visit questionnaire, review and updating patient chart, medical decision making and response to patient.   Margaretann Loveless, PA-C

## 2022-08-20 ENCOUNTER — Telehealth: Payer: Self-pay

## 2022-08-20 ENCOUNTER — Encounter: Payer: Self-pay | Admitting: General Surgery

## 2022-08-20 ENCOUNTER — Ambulatory Visit (INDEPENDENT_AMBULATORY_CARE_PROVIDER_SITE_OTHER): Payer: Medicaid Other | Admitting: General Surgery

## 2022-08-20 VITALS — BP 119/85 | HR 84 | Temp 98.2°F | Resp 18 | Ht 69.0 in | Wt 281.0 lb

## 2022-08-20 DIAGNOSIS — L0591 Pilonidal cyst without abscess: Secondary | ICD-10-CM | POA: Diagnosis not present

## 2022-08-20 NOTE — Transitions of Care (Post Inpatient/ED Visit) (Signed)
   08/20/2022  Name: Amy Nichols MRN: [id number] DOB: [birth year]  Today's TOC FU Call Status: Today's TOC FU Call Status:: Successful TOC FU Call Competed TOC FU Call Complete Date: 08/20/22  Transition Care Management Follow-up Telephone Call Date of Discharge: 08/19/22 Type of Discharge: Emergency Department Reason for ED Visit: Other: (pilonidal cyst) How have you been since you were released from the hospital?: Better Any questions or concerns?: No  Items Reviewed: Did you receive and understand the discharge instructions provided?: Yes Medications obtained and verified?: Yes (Medications Reviewed) Any new allergies since your discharge?: No Dietary orders reviewed?: Yes Do you have support at home?: No  Home Care and Equipment/Supplies: Were Home Health Services Ordered?: NA Any new equipment or medical supplies ordered?: NA  Functional Questionnaire: Do you need assistance with bathing/showering or dressing?: No Do you need assistance with meal preparation?: No Do you need assistance with eating?: No Do you have difficulty maintaining continence: No Do you need assistance with getting out of bed/getting out of a chair/moving?: No Do you have difficulty managing or taking your medications?: No  Follow up appointments reviewed: PCP Follow-up appointment confirmed?: NA Specialist Hospital Follow-up appointment confirmed?: Yes Date of Specialist follow-up appointment?: 08/20/22 Follow-Up Specialty Provider:: surgeon [name] Do you need transportation to your follow-up appointment?: No Do you understand care options if your condition(s) worsen?: Yes-patient verbalized understanding    SIGNATURE Karena Addison, LPN [facility] Nurse Health Advisor Direct Dial [phone]

## 2022-08-21 NOTE — Progress Notes (Addendum)
Amy Nichols; [birth year]   HPI Patient is a 27 year old black female who was referred to my care by Dr. Ivin Booty Dettinger for evaluation treatment of a pilonidal cyst.  She recently underwent incision and drainage of the pilonidal cyst and was started on Bactrim.  The ER packed the wound.  She states she has had multiple episodes of incision and drainage of this area in the past.  She has never had surgical intervention.  Packing fell out several days ago. Past Medical History:  Diagnosis Date   Allergies    GERD (gastroesophageal reflux disease)    Hypertension    no meds at this time    Past Surgical History:  Procedure Laterality Date   tear duct probe     TONSILLECTOMY AND ADENOIDECTOMY Bilateral 01/23/2020   Procedure: TONSILLECTOMY AND ADENOIDECTOMY;  Surgeon: Newman Pies, MD;  Location: [facility];  Service: ENT;  Laterality: Bilateral;    Family History  Problem Relation Age of Onset   Asthma Mother    Hypertension Mother    Asthma Father    Hypertension Father    COPD Maternal Grandmother    COPD Maternal Grandfather    Hypertension Paternal Grandmother    Hypertension Paternal Grandfather     Current Outpatient Medications on File Prior to Visit  Medication Sig Dispense Refill   albuterol (VENTOLIN HFA) 108 (90 Base) MCG/ACT inhaler Inhale 2 puffs into the lungs every 6 (six) hours as needed for wheezing or shortness of breath. 8 g 0   benzonatate (TESSALON) 100 MG capsule Take 1 capsule (100 mg total) by mouth 3 (three) times daily as needed. 30 capsule 0   cetirizine (ZYRTEC) 10 MG tablet Take 1 tablet (10 mg total) by mouth daily. 30 tablet 0   DULoxetine (CYMBALTA) 30 MG capsule Take 1 capsule (30 mg total) by mouth daily. 30 capsule 2   fluticasone (FLONASE) 50 MCG/ACT nasal spray Place 2 sprays into both nostrils daily. 16 g 0   ibuprofen (ADVIL) 800 MG tablet Take 1 tablet (800 mg total) by mouth every 8 (eight) hours as needed. 30  tablet 1   lidocaine (XYLOCAINE) 2 % solution Swallow 5mL every 6 hours as needed for sore throat 100 mL 0   omeprazole (PRILOSEC) 40 MG capsule Take 1 capsule (40 mg total) by mouth 2 (two) times daily before a meal. 180 capsule 3   promethazine-dextromethorphan (PROMETHAZINE-DM) 6.25-15 MG/5ML syrup Take 5 mLs by mouth 4 (four) times daily as needed. 118 mL 0   sulfamethoxazole-trimethoprim (BACTRIM DS) 800-160 MG tablet Take 1 tablet by mouth 2 (two) times daily. 14 tablet 0   traZODone (DESYREL) 50 MG tablet TAKE 1/2 TO 1 TABLET BY MOUTH AT BEDTIME AS NEEDED FOR SLEEP 90 tablet 2   No current facility-administered medications on file prior to visit.    No Known Allergies  Social History   Substance and Sexual Activity  Alcohol Use No    Social History   Tobacco Use  Smoking Status Never  Smokeless Tobacco Never    Review of Systems  Constitutional: Negative.   HENT:  Positive for sinus pain.   Eyes: Negative.   Respiratory:  Positive for cough.   Cardiovascular: Negative.   Gastrointestinal:  Positive for heartburn and nausea.  Genitourinary: Negative.   Musculoskeletal:  Positive for back pain and joint pain.  Skin: Negative.   Neurological:  Positive for dizziness and tremors.  Endo/Heme/Allergies:  Bruises/bleeds easily.  Psychiatric/Behavioral: Negative.  Objective   Vitals:   08/20/22 1104  BP: 119/85  Pulse: 84  Resp: 18  Temp: 98.2 F (36.8 C)  SpO2: 97%    Physical Exam Vitals reviewed.  Constitutional:      Appearance: Normal appearance. She is obese. She is not ill-appearing.  HENT:     Head: Normocephalic and atraumatic.  Cardiovascular:     Rate and Rhythm: Normal rate and regular rhythm.     Heart sounds: Normal heart sounds. No murmur heard.    No friction rub. No gallop.  Pulmonary:     Effort: Pulmonary effort is normal. No respiratory distress.     Breath sounds: Normal breath sounds. No stridor. No wheezing, rhonchi or rales.   Skin:    General: Skin is warm and dry.     Comments: A small wound with packing in place just to the left of the coccyx.  Area cleaned with Q-tip and peroxide.  Neurological:     Mental Status: She is alert and oriented to person, place, and time.     Assessment  Pilonidal cyst with abscess, resolving Plan  I told the patient that she needs to complete her antibiotic course.  I will see her back again in approximately 1 month to reassess the cyst and plan for excision of the pilonidal cyst.  She should clean the wound twice a day with a Q-tip and peroxide.  She may use soap and water.

## 2022-09-01 ENCOUNTER — Ambulatory Visit: Payer: Medicaid Other | Admitting: Surgery

## 2022-09-02 ENCOUNTER — Ambulatory Visit: Payer: Medicaid Other | Admitting: Family Medicine

## 2022-09-09 ENCOUNTER — Other Ambulatory Visit: Payer: Self-pay | Admitting: Family Medicine

## 2022-09-09 DIAGNOSIS — F419 Anxiety disorder, unspecified: Secondary | ICD-10-CM

## 2022-09-17 ENCOUNTER — Ambulatory Visit: Payer: Medicaid Other | Admitting: General Surgery

## 2022-10-25 ENCOUNTER — Encounter: Payer: Self-pay | Admitting: Family Medicine

## 2022-10-25 DIAGNOSIS — F419 Anxiety disorder, unspecified: Secondary | ICD-10-CM

## 2022-10-25 DIAGNOSIS — F5104 Psychophysiologic insomnia: Secondary | ICD-10-CM

## 2022-10-26 MED ORDER — TRAZODONE HCL 50 MG PO TABS: 50.0000 mg | ORAL_TABLET | Freq: Every evening | ORAL | 1 refills | Status: DC | PRN

## 2022-10-27 MED ORDER — TRAZODONE HCL 100 MG PO TABS: 100.0000 mg | ORAL_TABLET | Freq: Every day | ORAL | 1 refills | Status: DC

## 2022-10-27 NOTE — Addendum Note (Signed)
Addended by: Ignacia Bayley on: 10/27/2022 09:27 AM   Modules accepted: Orders

## 2022-11-04 ENCOUNTER — Telehealth: Payer: Self-pay | Admitting: Family Medicine

## 2022-11-04 NOTE — Telephone Encounter (Signed)
Hot, dizzy, seeing oil spots.  Has been on Lisinopril in the past but d/c due to starting Cymbalta?  Advised pt that Dettinger would probably want to get some readings from her prior to starting a medication.  Will send message to Dettinger to review.

## 2022-11-04 NOTE — Telephone Encounter (Signed)
Pt called to let PCP know that she is still having issues with her BP. Says she doesn't know what her BP was yesterday but she could tell it was high. Wants to speak to nurse.

## 2022-11-04 NOTE — Telephone Encounter (Signed)
Yes please get some blood pressure readings over the next week, if the vision starts to get worse then come in sooner to be examined but see if she can make an appointment within the next few weeks for blood pressure check.

## 2022-11-05 NOTE — Telephone Encounter (Signed)
No answer and vmail not set up 

## 2022-11-10 NOTE — Telephone Encounter (Signed)
Pt has an appt 7/8

## 2022-11-16 ENCOUNTER — Ambulatory Visit (INDEPENDENT_AMBULATORY_CARE_PROVIDER_SITE_OTHER): Payer: Medicaid Other | Admitting: Family Medicine

## 2022-11-16 VITALS — BP 126/77 | HR 107 | Temp 98.0°F | Resp 20 | Ht 69.0 in | Wt 291.1 lb

## 2022-11-16 DIAGNOSIS — M25561 Pain in right knee: Secondary | ICD-10-CM

## 2022-11-16 DIAGNOSIS — I10 Essential (primary) hypertension: Secondary | ICD-10-CM

## 2022-11-16 DIAGNOSIS — E781 Pure hyperglyceridemia: Secondary | ICD-10-CM | POA: Diagnosis not present

## 2022-11-16 MED ORDER — MELOXICAM 15 MG PO TABS: 15.0000 mg | ORAL_TABLET | Freq: Every day | ORAL | 1 refills | Status: DC

## 2022-11-16 MED ORDER — TRAZODONE HCL 100 MG PO TABS
100.0000 mg | ORAL_TABLET | Freq: Every day | ORAL | 1 refills | Status: AC
Start: 2022-11-16 — End: ?

## 2022-11-16 MED ORDER — WEGOVY 0.25 MG/0.5ML ~~LOC~~ SOAJ: 0.2500 mg | SUBCUTANEOUS | 3 refills | Status: DC

## 2022-11-16 NOTE — Progress Notes (Signed)
BP 126/77   Pulse (!) 107   Temp 98 F (36.7 C) (Oral)   Resp 20   Ht 5\' 9"  (1.753 m)   Wt 291 lb 2 oz (132.1 kg)   SpO2 99%   BMI 42.99 kg/m    Subjective:   Patient ID: Mallie Snooks, female    DOB: 04/02/1996, 27 y.o.   MRN: [id number]  HPI: KATHLYN PAFFORD is a 27 y.o. female presenting on 11/16/2022 for Hypertension   HPI Hypertension Patient is currently on no medication currently, diet control, blood pressure was up because of the Cymbalta and she stopped it and it improved, and their blood pressure today is 126/77. Patient denies any lightheadedness or dizziness. Patient denies headaches, blurred vision, chest pains, shortness of breath, or weakness. Denies any side effects from medication and is content with current medication.   Hypertriglyceridemia Patient is coming in for recheck of his hyperlipidemia. The patient is currently taking no medicine currently, diet control. They deny any issues with myalgias or history of liver damage from it. They deny any focal numbness or weakness or chest pain.   Right knee pain Patient has chronic intermittent right knee pain that hurts on the lateral aspect, likely strain versus early arthritic issues.  Obesity is likely playing a factor in it.  Patient's hypertension are more complicated by the patient's morbid obesity.  Discussed weight loss and lifestyle modification and exercise with the patient.  We discussed weight loss options and weight loss clinics and she has been trying to go to and doing some things on her own and just does not seem to be improving.   Relevant past medical, surgical, family and social history reviewed and updated as indicated. Interim medical history since our last visit reviewed. Allergies and medications reviewed and updated.  Review of Systems  Constitutional:  Negative for chills and fever.  Eyes:  Negative for visual disturbance.  Respiratory:  Negative for chest tightness and shortness of  breath.   Cardiovascular:  Negative for chest pain and leg swelling.  Genitourinary:  Negative for difficulty urinating and dysuria.  Musculoskeletal:  Positive for arthralgias. Negative for back pain and gait problem.  Skin:  Negative for rash.  Neurological:  Negative for dizziness, light-headedness and headaches.  Psychiatric/Behavioral:  Negative for agitation and behavioral problems.   All other systems reviewed and are negative.   Per HPI unless specifically indicated above   Allergies as of 11/16/2022   No Known Allergies      Medication List        Accurate as of November 16, 2022  4:33 PM. If you have any questions, ask your nurse or doctor.          STOP taking these medications    benzonatate 100 MG capsule Commonly known as: TESSALON Stopped by: Nils Pyle, MD   promethazine-dextromethorphan 6.25-15 MG/5ML syrup Commonly known as: PROMETHAZINE-DM Stopped by: [name] Afton Lavalle, MD   sulfamethoxazole-trimethoprim 800-160 MG tablet Commonly known as: BACTRIM DS Stopped by: [name] Anastasija Anfinson, MD       TAKE these medications    albuterol 108 (90 Base) MCG/ACT inhaler Commonly known as: VENTOLIN HFA Inhale 2 puffs into the lungs every 6 (six) hours as needed for wheezing or shortness of breath.   cetirizine 10 MG tablet Commonly known as: ZYRTEC Take 1 tablet (10 mg total) by mouth daily.   fluticasone 50 MCG/ACT nasal spray Commonly known as: FLONASE Place 2 sprays into both nostrils  daily.   ibuprofen 800 MG tablet Commonly known as: ADVIL Take 1 tablet (800 mg total) by mouth every 8 (eight) hours as needed.   lidocaine 2 % solution Commonly known as: XYLOCAINE Swallow 5mL every 6 hours as needed for sore throat   meloxicam 15 MG tablet Commonly known as: MOBIC Take 1 tablet (15 mg total) by mouth daily. Started by: Nils Pyle, MD   omeprazole 40 MG capsule Commonly known as: PRILOSEC Take 1 capsule (40 mg total) by mouth 2  (two) times daily before a meal.   traZODone 100 MG tablet Commonly known as: DESYREL Take 1 tablet (100 mg total) by mouth at bedtime.   Wegovy 0.25 MG/0.5ML Soaj Generic drug: Semaglutide-Weight Management Inject 0.25 mg into the skin once a week. Started by: [name] Hodges Treiber, MD         Objective:   BP 126/77   Pulse (!) 107   Temp 98 F (36.7 C) (Oral)   Resp 20   Ht 5\' 9"  (1.753 m)   Wt 291 lb 2 oz (132.1 kg)   SpO2 99%   BMI 42.99 kg/m   Wt Readings from Last 3 Encounters:  11/16/22 291 lb 2 oz (132.1 kg)  08/20/22 281 lb (127.5 kg)  07/20/22 281 lb (127.5 kg)    Physical Exam Vitals and nursing note reviewed.  Constitutional:      General: She is not in acute distress.    Appearance: She is well-developed. She is not diaphoretic.  Eyes:     Conjunctiva/sclera: Conjunctivae normal.  Cardiovascular:     Rate and Rhythm: Normal rate and regular rhythm.     Heart sounds: Normal heart sounds. No murmur heard. Pulmonary:     Effort: Pulmonary effort is normal. No respiratory distress.     Breath sounds: Normal breath sounds. No wheezing.  Musculoskeletal:        General: No swelling. Normal range of motion.  Skin:    General: Skin is warm and dry.     Findings: No rash.  Neurological:     Mental Status: She is alert and oriented to person, place, and time.     Coordination: Coordination normal.  Psychiatric:        Behavior: Behavior normal.       Assessment & Plan:   Problem List Items Addressed This Visit       Cardiovascular and Mediastinum   Essential hypertension - Primary     Other   Hypertriglyceridemia   Morbid obesity (HCC)   Relevant Medications   Semaglutide-Weight Management (WEGOVY) 0.25 MG/0.5ML SOAJ   Other Visit Diagnoses     Arthralgia of right knee       Relevant Medications   meloxicam (MOBIC) 15 MG tablet       Patient's BMI is >30 mg/m2.  Patient's current BMI is Body mass index is 42.99 kg/m.Marland Kitchen  Patient is  currently enrolled in a healthy eating plan along with encouraged exercise.  Patient has contraindications to phentermine, Contrave & Qsymia (contains phentermine) because of blood pressure issues and being sensitive to norepinephrine containing agents.  Patient does not have a personal or family history of medullary thyroid carcinoma (MTC) or Multiple Endocrine Neoplasia syndrome type 2 (MEN 2).  Follow up plan: Return in about 3 months (around 02/16/2023), or if symptoms worsen or fail to improve, for Obesity and hypertension recheck.  Counseling provided for all of the vaccine components No orders of the defined types were placed in  this encounter.   Arville Care, MD [facility] 11/16/2022, 4:33 PM

## 2022-11-19 ENCOUNTER — Telehealth: Payer: Self-pay

## 2022-11-19 ENCOUNTER — Telehealth: Payer: Self-pay | Admitting: Family Medicine

## 2022-11-19 NOTE — Telephone Encounter (Signed)
Pharmacy Patient Advocate Encounter   Received notification from [facility] that prior authorization for Wegovy 0.25MG /0.5ML auto-injectors is required/requested.   PA submitted to [facility] via CoverMyMeds Key or [facility]) confirmation # [id number] Status is pending

## 2022-11-19 NOTE — Telephone Encounter (Signed)
Please let the patient know that Reginal Lutes was not covered

## 2022-11-19 NOTE — Telephone Encounter (Signed)
PA submitted via CMM. Created new encounter for PA. Will route back to pool once determination has been made. 

## 2022-11-19 NOTE — Telephone Encounter (Signed)
Pharmacy Patient Advocate Encounter  Received notification from [facility] MEDICAID that Prior Authorization for [facility] 0.25MG /0.5ML auto-injectors has been DENIED because Weight loss drugs are excluded from coverage.     PA #/Case ID/Reference #: [id number]   Please be advised we currently do not have a Pharmacist to review denials, therefore you will need to process appeals accordingly as needed. Thanks for your support at this time. Contact for appeals are as follows: Phone: [phone], Fax: [phone]

## 2022-11-20 NOTE — Telephone Encounter (Signed)
No answer and vmail not set up 

## 2022-11-20 NOTE — Telephone Encounter (Signed)
Pt says that insurance company told pt to have dr office--send in [employer] as an Contractor with low dosage and if this does not work pt agrees with weight loss and wellness clinic referral. Pt does not have location preferences.

## 2022-11-20 NOTE — Telephone Encounter (Signed)
Yes I would have her call her insurance company and see if they do cover anything for weight loss.  Unfortunately the not a lot of companies are.  If she would like to be referred to healthy weight and wellness weight loss clinic where they do weight loss treatment with therapy of the medicines that I do think her insurance would cover that.

## 2022-11-20 NOTE — Telephone Encounter (Signed)
Pt made aware. She would like to know if there is anything else she can try.  Advised pt that with her insurance usually wt loss meds are not covered.  Advised that she should call them and see if they do cover anything. In the meantime will get advise from Dr. Louanne Skye.

## 2022-11-23 NOTE — Telephone Encounter (Signed)
Yes okay to go ahead and place referral, thanks, diagnosis obesity

## 2022-11-23 NOTE — Addendum Note (Signed)
Addended by: Dorene Sorrow on: 11/23/2022 04:37 PM   Modules accepted: Orders

## 2022-11-23 NOTE — Telephone Encounter (Signed)
Patient aware and verbalizes understanding.  States she would like referral.  Aware referral will be placed.

## 2022-11-23 NOTE — Telephone Encounter (Signed)
Referral placed.

## 2022-11-23 NOTE — Telephone Encounter (Signed)
Trulicity does not have an indication for weight loss and has never been approved for weight loss so it will not be approved.  If she does want to go to the healthy weight and wellness clinic then we can do a referral for that.  Trulicity only has approval for diabetes

## 2022-11-24 ENCOUNTER — Telehealth: Payer: 59 | Admitting: Physician Assistant

## 2022-11-24 DIAGNOSIS — R3989 Other symptoms and signs involving the genitourinary system: Secondary | ICD-10-CM

## 2022-11-24 MED ORDER — CEPHALEXIN 500 MG PO CAPS: 500.0000 mg | ORAL_CAPSULE | Freq: Two times a day (BID) | ORAL | 0 refills | Status: DC

## 2022-11-24 NOTE — Progress Notes (Signed)

## 2022-11-24 NOTE — Progress Notes (Signed)
I have spent 5 minutes in review of e-visit questionnaire, review and updating patient chart, medical decision making and response to patient.   William Cody Martin, PA-C    

## 2022-11-28 ENCOUNTER — Telehealth: Payer: Medicaid Other | Admitting: Nurse Practitioner

## 2022-11-28 DIAGNOSIS — L0591 Pilonidal cyst without abscess: Secondary | ICD-10-CM

## 2022-11-28 DIAGNOSIS — R3989 Other symptoms and signs involving the genitourinary system: Secondary | ICD-10-CM

## 2022-11-28 MED ORDER — CEPHALEXIN 500 MG PO CAPS: 500.0000 mg | ORAL_CAPSULE | Freq: Two times a day (BID) | ORAL | 0 refills | Status: AC

## 2022-11-28 NOTE — Progress Notes (Signed)
E Visit for Cellulitis  We are sorry that you are not feeling well. Here is how we plan to help!  Based on what you shared with me it looks like you have pilonidal cyst.  Pilonidal cyst is an unusual pocket in the skin that usually contains hair and skin debris. The cyst is almost always near the tailbone at the top of the buttocks.  Pilonidal cysts usually occur when hair punctures the skin and then becomes embedded. If a pilonidal cyst becomes infected, it can be very painful. The cyst can be drained through a small cut in the skin. Sometimes, surgery is needed.  I have prescribed:  Keflex 500mg  take one by mouth four times a day for 5 days  HOME CARE:  Take your medications as ordered and take all of them, even if the skin irritation appears to be healing.   GET HELP RIGHT AWAY IF:  Symptoms that don't begin to go away within 48 hours. Severe redness persists or worsens If the area turns color, spreads or swells. If it blisters and opens, develops yellow-brown crust or bleeds. You develop a fever or chills. If the pain increases or becomes unbearable.  Are unable to keep fluids and food down.  MAKE SURE YOU   Understand these instructions. Will watch your condition. Will get help right away if you are not doing well or get worse.  Thank you for choosing an e-visit.  Your e-visit answers were reviewed by a board certified advanced clinical practitioner to complete your personal care plan. Depending upon the condition, your plan could have included both over the counter or prescription medications.  Please review your pharmacy choice. Make sure the pharmacy is open so you can pick up prescription now. If there is a problem, you may contact your provider through [employer] and have the prescription routed to another pharmacy.  Your safety is important to [name]. If you have drug allergies check your prescription carefully.   For the next 24 hours you can use MyChart to ask  questions about today's visit, request a non-urgent call back, or ask for a work or school excuse. You will get an email in the next two days asking about your experience. I hope that your e-visit has been valuable and will speed your recovery.   Mary-Margaret Daphine Deutscher, FNP   5-10 minutes spent reviewing and documenting in chart.

## 2022-12-03 ENCOUNTER — Encounter: Payer: Self-pay | Admitting: General Surgery

## 2022-12-03 ENCOUNTER — Ambulatory Visit (INDEPENDENT_AMBULATORY_CARE_PROVIDER_SITE_OTHER): Payer: 59 | Admitting: General Surgery

## 2022-12-03 VITALS — BP 132/88 | HR 87 | Temp 97.9°F | Resp 12 | Ht 69.0 in | Wt 291.0 lb

## 2022-12-03 DIAGNOSIS — L0591 Pilonidal cyst without abscess: Secondary | ICD-10-CM

## 2022-12-03 NOTE — Progress Notes (Signed)
Subjective:     Amy Nichols  Patient referred herself back to my care for evaluation and treatment of her pilonidal cyst.  I last saw her in my office for the same condition in April 2024.  She states that approximately 1 week ago, she started having drainage from her pilonidal cyst.  She was placed on Keflex.  She is still taking at the present time.  The drainage is decreasing, but it is tender to touch.  She denies any fevers. Objective:    BP 132/88   Pulse 87   Temp 97.9 F (36.6 C) (Oral)   Resp 12   Ht 5\' 9"  (1.753 m)   Wt 291 lb (132 kg)   SpO2 97%   BMI 42.97 kg/m   General:  alert, cooperative, and no distress  Head is normocephalic, atraumatic Lungs clear to auscultation with equal breath sounds bilaterally Heart examination reveals a regular rate and rhythm without S3, S4, murmurs Just to the left of the coccyx, a healing wound is present with minimal drainage.  There is induration and tenderness in the pilonidal region.     Assessment:    Recurrent infected pilonidal cyst, being treated with Keflex    Plan:   I told the patient that I would like to wait a month and then schedule her for excision of the pilonidal cyst when the infection is gone.  She understands and agrees.  She will follow-up with me on 12/24/2022 to schedule surgery.  She should finish her Keflex regimen.  Keep the wound clean and dry.

## 2022-12-24 ENCOUNTER — Ambulatory Visit (INDEPENDENT_AMBULATORY_CARE_PROVIDER_SITE_OTHER): Payer: 59 | Admitting: General Surgery

## 2022-12-24 ENCOUNTER — Encounter: Payer: Self-pay | Admitting: General Surgery

## 2022-12-24 VITALS — BP 128/77 | HR 98 | Temp 98.3°F | Resp 12 | Ht 69.0 in | Wt 291.0 lb

## 2022-12-24 DIAGNOSIS — L0591 Pilonidal cyst without abscess: Secondary | ICD-10-CM | POA: Diagnosis not present

## 2022-12-24 NOTE — Addendum Note (Signed)
Addended by: Phillips Odor on: 12/24/2022 10:22 AM   Modules accepted: Orders

## 2022-12-24 NOTE — Progress Notes (Signed)
Subjective:     Amy Nichols  Patient states that the drainage has stopped.  She reports having 3-4 episodes of inflammation over the past year. Objective:    BP 128/77   Pulse 98   Temp 98.3 F (36.8 C) (Oral)   Resp 12   Ht 5\' 9"  (1.753 m)   Wt 291 lb (132 kg)   SpO2 95%   BMI 42.97 kg/m   General:  alert, cooperative, and no distress  Pilonidal region with healed wound.  Minimal induration.  No erythema.     Assessment:    Infected pilonidal cyst, infection resolved    Plan:   Patient is scheduled for excision of the pilonidal cyst on 01/07/2023.  The risks and benefits of the procedure including bleeding, infection, and recurrence of the pilonidal cyst were fully explained to the patient, who gave informed consent.

## 2022-12-24 NOTE — H&P (Signed)
Amy Nichols; [id number]; November 02, 1995   HPI Patient is a 27 year old black female who was referred to my care by Dr. Ivin Booty Dettinger for evaluation treatment of a pilonidal cyst.  She recently underwent incision and drainage of the pilonidal cyst and was started on Bactrim.  The ER packed the wound.  She states she has had multiple episodes of incision and drainage of this area in the past.  She has never had surgical intervention.  Packing fell out several days ago. Past Medical History:  Diagnosis Date   Allergies    GERD (gastroesophageal reflux disease)    Hypertension    no meds at this time    Past Surgical History:  Procedure Laterality Date   tear duct probe     TONSILLECTOMY AND ADENOIDECTOMY Bilateral 01/23/2020   Procedure: TONSILLECTOMY AND ADENOIDECTOMY;  Surgeon: Newman Pies, MD;  Location:  SUR[facility]ERY CENTER;  Service: ENT;  Laterality: Bilateral;    Family History  Problem Relation Age of Onset   Asthma Mother    Hypertension Mother    Asthma Father    Hypertension Father    COPD Maternal Grandmother    COPD Maternal Grandfather    Hypertension Paternal Grandmother    Hypertension Paternal Grandfather     Current Outpatient Medications on File Prior to Visit  Medication Sig Dispense Refill   albuterol (VENTOLIN HFA) 108 (90 Base) MCG/ACT inhaler Inhale 2 puffs into the lungs every 6 (six) hours as needed for wheezing or shortness of breath. 8 g 0   benzonatate (TESSALON) 100 MG capsule Take 1 capsule (100 mg total) by mouth 3 (three) times daily as needed. 30 capsule 0   cetirizine (ZYRTEC) 10 MG tablet Take 1 tablet (10 mg total) by mouth daily. 30 tablet 0   DULoxetine (CYMBALTA) 30 MG capsule Take 1 capsule (30 mg total) by mouth daily. 30 capsule 2   fluticasone (FLONASE) 50 MCG/ACT nasal spray Place 2 sprays into both nostrils daily. 16 g 0   ibuprofen (ADVIL) 800 MG tablet Take 1 tablet (800 mg total) by mouth every 8 (eight) hours as needed. 30  tablet 1   lidocaine (XYLOCAINE) 2 % solution Swallow 5mL every 6 hours as needed for sore throat 100 mL 0   omeprazole (PRILOSEC) 40 MG capsule Take 1 capsule (40 mg total) by mouth 2 (two) times daily before a meal. 180 capsule 3   promethazine-dextromethorphan (PROMETHAZINE-DM) 6.25-15 MG/5ML syrup Take 5 mLs by mouth 4 (four) times daily as needed. 118 mL 0   sulfamethoxazole-trimethoprim (BACTRIM DS) 800-160 MG tablet Take 1 tablet by mouth 2 (two) times daily. 14 tablet 0   traZODone (DESYREL) 50 MG tablet TAKE 1/2 TO 1 TABLET BY MOUTH AT BEDTIME AS NEEDED FOR SLEEP 90 tablet 2   No current facility-administered medications on file prior to visit.    No Known Allergies  Social History   Substance and Sexual Activity  Alcohol Use No    Social History   Tobacco Use  Smoking Status Never  Smokeless Tobacco Never    Review of Systems  Constitutional: Negative.   HENT:  Positive for sinus pain.   Eyes: Negative.   Respiratory:  Positive for cough.   Cardiovascular: Negative.   Gastrointestinal:  Positive for heartburn and nausea.  Genitourinary: Negative.   Musculoskeletal:  Positive for back pain and joint pain.  Skin: Negative.   Neurological:  Positive for dizziness and tremors.  Endo/Heme/Allergies:  Bruises/bleeds easily.  Psychiatric/Behavioral: Negative.  Objective   Vitals:   08/20/22 1104  BP: 119/85  Pulse: 84  Resp: 18  Temp: 98.2 F (36.8 C)  SpO2: 97%    Physical Exam Vitals reviewed.  Constitutional:      Appearance: Normal appearance. She is obese. She is not ill-appearing.  HENT:     Head: Normocephalic and atraumatic.  Cardiovascular:     Rate and Rhythm: Normal rate and regular rhythm.     Heart sounds: Normal heart sounds. No murmur heard.    No friction rub. No gallop.  Pulmonary:     Effort: Pulmonary effort is normal. No respiratory distress.     Breath sounds: Normal breath sounds. No stridor. No wheezing, rhonchi or rales.   Skin:    General: Skin is warm and dry.     Comments: A small wound with packing in place just to the left of the coccyx.  Area cleaned with Q-tip and peroxide.  Neurological:     Mental Status: She is alert and oriented to person, place, and time.     Assessment  Pilonidal cyst with abscess, resolving Plan  Patient is scheduled for excision of the pilonidal cyst on 01/07/2023.  The risks and benefits of the procedure including bleeding, infection, wound disruption, and the possibility of recurrence of the pilonidal cyst were fully explained to the patient, who gave informed consent.

## 2023-01-01 ENCOUNTER — Telehealth: Payer: Self-pay | Admitting: *Deleted

## 2023-01-01 MED ORDER — SULFAMETHOXAZOLE-TRIMETHOPRIM 800-160 MG PO TABS: 1.0000 | ORAL_TABLET | Freq: Two times a day (BID) | ORAL | 0 refills | Status: AC

## 2023-01-01 NOTE — Telephone Encounter (Signed)
Surgical Date: 01/07/2023 Procedure: CYST EXCISION PILONIDAL SIMPLE   Received call from patient [phone]~ telephone.   Patient reports that she has x1 day of tenderness to pilonidal cyst. States that she notes hardness to scar tissue where I&D previously attempted, and area is hot to touch. Patient denies fever/ chills.   Inquired if she can proceed with scheduled surgery on 01/07/2023.  Also inquired if ABTx is appropriate.   Discussed with provider, Dr. [name] whose recommendations are as follows: Proceed with scheduled surgery as last inflammation/ infection did not produce any purulent drainage.  Begin Bactrim DS PO BID x7 days.  Use warm compresses/ sitz baths as needed for comfort.   Call placed to patient and patient made aware. Prescription sent to pharmacy.

## 2023-01-05 NOTE — Patient Instructions (Signed)
[facility]  01/05/2023     @PREFPERIOPPHARMACY @   Your procedure is scheduled on  01/07/2023.   Report to [facility] at  0920  A.M.   Call this number if you have problems the morning of surgery:  [phone]  If you experience any cold or flu symptoms such as cough, fever, chills, shortness of breath, etc. between now and your scheduled surgery, please notify [facility] at the above number.   Remember:  Do not eat or drink after midnight.       Use your inhaler before you come and bring your rescue inhaler with you.     Take these medicines the morning of surgery with A SIP OF WATER                                    zyrtec, omeprazole.     Do not wear jewelry, make-up or nail polish, including gel polish,  artificial nails, or any other type of covering on natural nails (fingers and  toes).  Do not wear lotions, powders, or perfumes, or deodorant.  Do not shave 48 hours prior to surgery.  Men may shave face and neck.  Do not bring valuables to the [facility].  [facility] is not responsible for any belongings or valuables.  Contacts, dentures or bridgework may not be worn into surgery.  Leave your suitcase in the car.  After surgery it may be brought to your room.  For patients admitted to the [facility], discharge time will be determined by your treatment team.  Patients discharged the day of surgery will not be allowed to drive home and must have someone with them for 24 hours.    Special instructions:   DO NOT smoke tobacco or vape for 24 hours before your procedure.  Please read over the following fact sheets that you were given. Pain Booklet, Coughing and Deep Breathing, Surgical Site Infection Prevention, Anesthesia Post-op Instructions, and Care and Recovery After Surgery       Pilonidal Cyst Drainage, Care After The following information offers guidance on how to care for yourself after your procedure. Your health care provider may also give you more  specific instructions. If you have problems or questions, contact your health care provider. What can I expect after the procedure? After the procedure, it is common to have: Pain that gets better when you take medicine. Some fluid or blood coming from your wound. Follow these instructions at home: Medicines Take over-the-counter and prescription medicines only as told by your health care provider. If you were prescribed antibiotics, take them as told by your health care provider. Do not stop using the antibiotic even if you start to feel better. Ask your health care provider if the medicine prescribed to you: Requires you to avoid driving or using machinery. Can cause constipation. You may need to take these actions to prevent or treat constipation: Drink enough fluid to keep your urine pale yellow. Take over-the-counter or prescription medicines. Eat foods that are high in fiber, such as beans, whole grains, and fresh fruits and vegetables. Limit foods that are high in fat and processed sugars, such as fried or sweet foods. Bathing Do not take baths or showers, swim, or use a hot tub until your health care provider approves. You may only be allowed to take sponge baths. When you can return to bathing will depend  on the type of wound that you have. If your wound was packed with a germ-free packing material, keep the area dry until your packing has been removed. After the packing has been removed, you may start taking showers when your health care provider approves. If the edges of the incision around your wound were stitched to your skin (marsupialization), you may start taking showers the day after surgery, or when your health care provider approves. Let the water from the shower moisten your bandage (dressing). This will make it easier to take off. Remove your dressing before you shower. While bathing, clean your buttocks area gently with soap and water. After bathing: Pat the area dry with  a soft, clean towel. If directed, cover the area with a clean dressing. Wound care  You may need to have a caregiver help you with wound care and dressing changes. Follow instructions from your health care provider about how to take care of your wound. Make sure you: Wash your hands with soap and water for at least 20 seconds before and after you change your dressing. If soap and water are not available, use hand sanitizer. Change your dressing as told by your health care provider. Leave stitches (sutures), skin glue, or adhesive strips in place. These skin closures may need to stay in place for 2 weeks or longer. If adhesive strip edges start to loosen and curl up, you may trim the loose edges. Do not remove adhesive strips completely unless your health care provider tells you to do that. Check your wound every day for signs of infection. Check for: Redness, swelling, or more pain. More fluid or blood. Warmth. Pus or a bad smell. Follow any additional instructions from your health care provider on how to care for your wound, such as wound cleaning, wound flushing (irrigation), or packing your wound with a dressing. If you had marsupialization, ask your health care provider when you can stop using a dressing. Lifestyle Do not do activities that irritate or put pressure on your buttocks for about 2 weeks, or as long as told by your health care provider. These activities include bike riding, running, and anything that involves a twisting motion. Rest as told by your health care provider. Do not sit for a long time without moving. Get up to take short walks every 1-2 hours. This will improve blood flow and breathing. Ask for help if you feel weak or unsteady. Sleep on your side instead of your back. Avoid wearing tight underwear and tight pants. Return to your normal activities as told by your health care provider. Ask your health care provider what activities are safe for you. General  instructions Do not use any products that contain nicotine or tobacco. These products include cigarettes, chewing tobacco, and vaping devices, such as e-cigarettes. These can delay wound healing after surgery. If you need help quitting, ask your health care provider. Keep all follow-up visits. This is important to monitor healing. If you had an incision and drainage procedure with wound packing, your packing may be changed or removed at follow-up visits. Contact a health care provider if: You have pain that does not get better with medicine. You have any of these signs of infection: More redness, swelling, or pain around your incision. More fluid or blood coming from your incision. Warmth coming from your incision. Pus or a bad smell coming from your incision. A fever. You have muscle aches. You feel generally sick. You are dizzy. Summary A pilonidal cyst is  a fluid-filled sac that forms under the skin near the tailbone. It is common to have some fluid or blood coming from your wound after a procedure to drain a pilonidal cyst. If you were prescribed antibiotics, take them as told by your health care provider. Do not stop taking the antibiotic even if you start to feel better. You may need to have a caregiver help you with wound care and dressing changes. Return to your health care provider as instructed to have any packing material changed or removed. This information is not intended to replace advice given to you by your health care provider. Make sure you discuss any questions you have with your health care provider. Document Revised: 07/23/2021 Document Reviewed: 07/23/2021 Elsevier Patient Education  2024 [employer] General Anesthesia, Adult, Care After The following information offers guidance on how to care for yourself after your procedure. Your health care provider may also give you more specific instructions. If you have problems or questions, contact your health care  provider. What can I expect after the procedure? After the procedure, it is common for people to: Have pain or discomfort at the IV site. Have nausea or vomiting. Have a sore throat or hoarseness. Have trouble concentrating. Feel cold or chills. Feel weak, sleepy, or tired (fatigue). Have soreness and body aches. These can affect parts of the body that were not involved in surgery. Follow these instructions at home: For the time period you were told by your health care provider:  Rest. Do not participate in activities where you could fall or become injured. Do not drive or use machinery. Do not drink alcohol. Do not take sleeping pills or medicines that cause drowsiness. Do not make important decisions or sign legal documents. Do not take care of children on your own. General instructions Drink enough fluid to keep your urine pale yellow. If you have sleep apnea, surgery and certain medicines can increase your risk for breathing problems. Follow instructions from your health care provider about wearing your sleep device: Anytime you are sleeping, including during daytime naps. While taking prescription pain medicines, sleeping medicines, or medicines that make you drowsy. Return to your normal activities as told by your health care provider. Ask your health care provider what activities are safe for you. Take over-the-counter and prescription medicines only as told by your health care provider. Do not use any products that contain nicotine or tobacco. These products include cigarettes, chewing tobacco, and vaping devices, such as e-cigarettes. These can delay incision healing after surgery. If you need help quitting, ask your health care provider. Contact a health care provider if: You have nausea or vomiting that does not get better with medicine. You vomit every time you eat or drink. You have pain that does not get better with medicine. You cannot urinate or have bloody urine. You  develop a skin rash. You have a fever. Get help right away if: You have trouble breathing. You have chest pain. You vomit blood. These symptoms may be an emergency. Get help right away. Call 911. Do not wait to see if the symptoms will go away. Do not drive yourself to the [facility]. Summary After the procedure, it is common to have a sore throat, hoarseness, nausea, vomiting, or to feel weak, sleepy, or fatigue. For the time period you were told by your health care provider, do not drive or use machinery. Get help right away if you have difficulty breathing, have chest pain, or vomit blood. These symptoms may  be an emergency. This information is not intended to replace advice given to you by your health care provider. Make sure you discuss any questions you have with your health care provider. Document Revised: 07/25/2021 Document Reviewed: 07/25/2021 Elsevier Patient Education  2024 [employer] How to Use Chlorhexidine Before Surgery Chlorhexidine gluconate (CHG) is a germ-killing (antiseptic) solution that is used to clean the skin. It can get rid of the bacteria that normally live on the skin and can keep them away for about 24 hours. To clean your skin with CHG, you may be given: A CHG solution to use in the shower or as part of a sponge bath. A prepackaged cloth that contains CHG. Cleaning your skin with CHG may help lower the risk for infection: While you are staying in the intensive care unit of the [facility]. If you have a vascular access, such as a central line, to provide short-term or long-term access to your veins. If you have a catheter to drain urine from your bladder. If you are on a ventilator. A ventilator is a machine that helps you breathe by moving air in and out of your lungs. After surgery. What are the risks? Risks of using CHG include: A skin reaction. Hearing loss, if CHG gets in your ears and you have a perforated eardrum. Eye injury, if CHG gets in your eyes  and is not rinsed out. The CHG product catching fire. Make sure that you avoid smoking and flames after applying CHG to your skin. Do not use CHG: If you have a chlorhexidine allergy or have previously reacted to chlorhexidine. On babies younger than 24 months of age. How to use CHG solution Use CHG only as told by your health care provider, and follow the instructions on the label. Use the full amount of CHG as directed. Usually, this is one bottle. During a shower Follow these steps when using CHG solution during a shower (unless your health care provider gives you different instructions): Start the shower. Use your normal soap and shampoo to wash your face and hair. Turn off the shower or move out of the shower stream. Pour the CHG onto a clean washcloth. Do not use any type of brush or rough-edged sponge. Starting at your neck, lather your body down to your toes. Make sure you follow these instructions: If you will be having surgery, pay special attention to the part of your body where you will be having surgery. Scrub this area for at least 1 minute. Do not use CHG on your head or face. If the solution gets into your ears or eyes, rinse them well with water. Avoid your genital area. Avoid any areas of skin that have broken skin, cuts, or scrapes. Scrub your back and under your arms. Make sure to wash skin folds. Let the lather sit on your skin for 1-2 minutes or as long as told by your health care provider. Thoroughly rinse your entire body in the shower. Make sure that all body creases and crevices are rinsed well. Dry off with a clean towel. Do not put any substances on your body afterward--such as powder, lotion, or perfume--unless you are told to do so by your health care provider. Only use lotions that are recommended by the manufacturer. Put on clean clothes or pajamas. If it is the night before your surgery, sleep in clean sheets.  During a sponge bath Follow these steps when  using CHG solution during a sponge bath (unless your health care provider  gives you different instructions): Use your normal soap and shampoo to wash your face and hair. Pour the CHG onto a clean washcloth. Starting at your neck, lather your body down to your toes. Make sure you follow these instructions: If you will be having surgery, pay special attention to the part of your body where you will be having surgery. Scrub this area for at least 1 minute. Do not use CHG on your head or face. If the solution gets into your ears or eyes, rinse them well with water. Avoid your genital area. Avoid any areas of skin that have broken skin, cuts, or scrapes. Scrub your back and under your arms. Make sure to wash skin folds. Let the lather sit on your skin for 1-2 minutes or as long as told by your health care provider. Using a different clean, wet washcloth, thoroughly rinse your entire body. Make sure that all body creases and crevices are rinsed well. Dry off with a clean towel. Do not put any substances on your body afterward--such as powder, lotion, or perfume--unless you are told to do so by your health care provider. Only use lotions that are recommended by the manufacturer. Put on clean clothes or pajamas. If it is the night before your surgery, sleep in clean sheets. How to use CHG prepackaged cloths Only use CHG cloths as told by your health care provider, and follow the instructions on the label. Use the CHG cloth on clean, dry skin. Do not use the CHG cloth on your head or face unless your health care provider tells you to. When washing with the CHG cloth: Avoid your genital area. Avoid any areas of skin that have broken skin, cuts, or scrapes. Before surgery Follow these steps when using a CHG cloth to clean before surgery (unless your health care provider gives you different instructions): Using the CHG cloth, vigorously scrub the part of your body where you will be having surgery. Scrub  using a back-and-forth motion for 3 minutes. The area on your body should be completely wet with CHG when you are done scrubbing. Do not rinse. Discard the cloth and let the area air-dry. Do not put any substances on the area afterward, such as powder, lotion, or perfume. Put on clean clothes or pajamas. If it is the night before your surgery, sleep in clean sheets.  For general bathing Follow these steps when using CHG cloths for general bathing (unless your health care provider gives you different instructions). Use a separate CHG cloth for each area of your body. Make sure you wash between any folds of skin and between your fingers and toes. Wash your body in the following order, switching to a new cloth after each step: The front of your neck, shoulders, and chest. Both of your arms, under your arms, and your hands. Your stomach and groin area, avoiding the genitals. Your right leg and foot. Your left leg and foot. The back of your neck, your back, and your buttocks. Do not rinse. Discard the cloth and let the area air-dry. Do not put any substances on your body afterward--such as powder, lotion, or perfume--unless you are told to do so by your health care provider. Only use lotions that are recommended by the manufacturer. Put on clean clothes or pajamas. Contact a health care provider if: Your skin gets irritated after scrubbing. You have questions about using your solution or cloth. You swallow any chlorhexidine. Call your local poison control center ([phone] in the [place] Get  help right away if: Your eyes itch badly, or they become very red or swollen. Your skin itches badly and is red or swollen. Your hearing changes. You have trouble seeing. You have swelling or tingling in your mouth or throat. You have trouble breathing. These symptoms may represent a serious problem that is an emergency. Do not wait to see if the symptoms will go away. Get medical help right away. Call  your local emergency services (911 in the [place] Do not drive yourself to the [facility]. Summary Chlorhexidine gluconate (CHG) is a germ-killing (antiseptic) solution that is used to clean the skin. Cleaning your skin with CHG may help to lower your risk for infection. You may be given CHG to use for bathing. It may be in a bottle or in a prepackaged cloth to use on your skin. Carefully follow your health care provider's instructions and the instructions on the product label. Do not use CHG if you have a chlorhexidine allergy. Contact your health care provider if your skin gets irritated after scrubbing. This information is not intended to replace advice given to you by your health care provider. Make sure you discuss any questions you have with your health care provider. Document Revised: 08/25/2021 Document Reviewed: 07/08/2020 Elsevier Patient Education  2023 [employer].

## 2023-01-06 ENCOUNTER — Encounter (HOSPITAL_COMMUNITY)
Admission: RE | Admit: 2023-01-06 | Discharge: 2023-01-06 | Disposition: A | Discharge status: Home / Self Care | Payer: 59 | Source: Ambulatory Visit | Attending: General Surgery | Admitting: General Surgery

## 2023-01-06 ENCOUNTER — Encounter (HOSPITAL_COMMUNITY): Payer: Self-pay

## 2023-01-06 ENCOUNTER — Other Ambulatory Visit: Payer: Self-pay

## 2023-01-06 DIAGNOSIS — Z01818 Encounter for other preprocedural examination: Secondary | ICD-10-CM

## 2023-01-06 DIAGNOSIS — Z01810 Encounter for preprocedural cardiovascular examination: Secondary | ICD-10-CM | POA: Diagnosis not present

## 2023-01-06 DIAGNOSIS — Z01812 Encounter for preprocedural laboratory examination: Secondary | ICD-10-CM | POA: Diagnosis not present

## 2023-01-06 DIAGNOSIS — L0591 Pilonidal cyst without abscess: Secondary | ICD-10-CM | POA: Insufficient documentation

## 2023-01-06 HISTORY — DX: Bronchitis, not specified as acute or chronic: J40

## 2023-01-06 HISTORY — DX: Anxiety disorder, unspecified: F41.9

## 2023-01-06 LAB — PREGNANCY, URINE: Preg Test, Ur: NEGATIVE

## 2023-01-07 ENCOUNTER — Encounter (HOSPITAL_COMMUNITY)
Admission: RE | Disposition: A | Discharge status: Home / Self Care | Payer: Self-pay | Source: Home / Self Care | Attending: General Surgery

## 2023-01-07 ENCOUNTER — Ambulatory Visit (HOSPITAL_COMMUNITY): Payer: 59 | Admitting: Certified Registered"

## 2023-01-07 ENCOUNTER — Other Ambulatory Visit: Payer: Self-pay

## 2023-01-07 ENCOUNTER — Ambulatory Visit (HOSPITAL_BASED_OUTPATIENT_CLINIC_OR_DEPARTMENT_OTHER): Payer: 59 | Admitting: Certified Registered"

## 2023-01-07 ENCOUNTER — Ambulatory Visit (HOSPITAL_COMMUNITY)
Admission: RE | Admit: 2023-01-07 | Discharge: 2023-01-07 | Disposition: A | Discharge status: Home / Self Care | Payer: 59 | Attending: General Surgery | Admitting: General Surgery

## 2023-01-07 ENCOUNTER — Encounter (HOSPITAL_COMMUNITY): Payer: Self-pay | Admitting: General Surgery

## 2023-01-07 DIAGNOSIS — F419 Anxiety disorder, unspecified: Secondary | ICD-10-CM | POA: Diagnosis not present

## 2023-01-07 DIAGNOSIS — Z01818 Encounter for other preprocedural examination: Secondary | ICD-10-CM

## 2023-01-07 DIAGNOSIS — Z6841 Body Mass Index (BMI) 40.0 and over, adult: Secondary | ICD-10-CM

## 2023-01-07 DIAGNOSIS — I10 Essential (primary) hypertension: Secondary | ICD-10-CM

## 2023-01-07 DIAGNOSIS — L0501 Pilonidal cyst with abscess: Secondary | ICD-10-CM | POA: Diagnosis not present

## 2023-01-07 DIAGNOSIS — L0591 Pilonidal cyst without abscess: Secondary | ICD-10-CM

## 2023-01-07 HISTORY — PX: PILONIDAL CYST EXCISION: SHX744

## 2023-01-07 SURGERY — EXCISION, SIMPLE PILONIDAL CYST
Anesthesia: General

## 2023-01-07 MED ORDER — MIDAZOLAM HCL 2 MG/2ML IJ SOLN
INTRAMUSCULAR | Status: AC
  Filled 2023-01-07: qty 2

## 2023-01-07 MED ORDER — FENTANYL CITRATE PF 50 MCG/ML IJ SOSY
PREFILLED_SYRINGE | INTRAMUSCULAR | Status: AC
  Filled 2023-01-07: qty 1

## 2023-01-07 MED ORDER — OXYCODONE HCL 5 MG/5ML PO SOLN: 5.0000 mg | Freq: Once | ORAL | Status: AC | PRN

## 2023-01-07 MED ORDER — BUPIVACAINE HCL (PF) 0.5 % IJ SOLN
INTRAMUSCULAR | Status: AC
  Filled 2023-01-07: qty 30

## 2023-01-07 MED ORDER — FENTANYL CITRATE (PF) 100 MCG/2ML IJ SOLN
INTRAMUSCULAR | Status: AC
  Filled 2023-01-07: qty 2

## 2023-01-07 MED ORDER — ONDANSETRON HCL 4 MG/2ML IJ SOLN
INTRAMUSCULAR | Status: DC | PRN
  Administered 2023-01-07: 4 mg via INTRAVENOUS

## 2023-01-07 MED ORDER — LACTATED RINGERS IV SOLN: INTRAVENOUS | Status: DC

## 2023-01-07 MED ORDER — CEFAZOLIN IN SODIUM CHLORIDE 3-0.9 GM/100ML-% IV SOLN
3.0000 g | INTRAVENOUS | Status: AC
  Administered 2023-01-07: 3 g via INTRAVENOUS

## 2023-01-07 MED ORDER — CHLORHEXIDINE GLUCONATE CLOTH 2 % EX PADS
6.0000 | MEDICATED_PAD | Freq: Once | CUTANEOUS | Status: AC
  Administered 2023-01-07: 6 via TOPICAL

## 2023-01-07 MED ORDER — CEFAZOLIN SODIUM-DEXTROSE 2-4 GM/100ML-% IV SOLN: 2.0000 g | INTRAVENOUS | Status: DC

## 2023-01-07 MED ORDER — FENTANYL CITRATE PF 50 MCG/ML IJ SOSY
25.0000 ug | PREFILLED_SYRINGE | INTRAMUSCULAR | Status: DC | PRN
  Administered 2023-01-07 (×2): 50 ug via INTRAVENOUS
  Filled 2023-01-07: qty 1

## 2023-01-07 MED ORDER — PROPOFOL 10 MG/ML IV BOLUS
INTRAVENOUS | Status: AC
  Filled 2023-01-07: qty 20

## 2023-01-07 MED ORDER — OXYCODONE HCL 5 MG PO TABS: 5.0000 mg | ORAL_TABLET | ORAL | 0 refills | Status: DC | PRN

## 2023-01-07 MED ORDER — OXYCODONE HCL 5 MG PO TABS
5.0000 mg | ORAL_TABLET | Freq: Once | ORAL | Status: AC | PRN
  Administered 2023-01-07: 5 mg via ORAL
  Filled 2023-01-07: qty 1

## 2023-01-07 MED ORDER — SODIUM CHLORIDE 0.9 % IR SOLN
Status: DC | PRN
  Administered 2023-01-07: 1000 mL

## 2023-01-07 MED ORDER — MIDAZOLAM HCL 2 MG/2ML IJ SOLN
INTRAMUSCULAR | Status: DC | PRN
  Administered 2023-01-07: 2 mg via INTRAVENOUS

## 2023-01-07 MED ORDER — CEFAZOLIN IN SODIUM CHLORIDE 3-0.9 GM/100ML-% IV SOLN
INTRAVENOUS | Status: AC
  Filled 2023-01-07: qty 100

## 2023-01-07 MED ORDER — ONDANSETRON HCL 4 MG/2ML IJ SOLN: 4.0000 mg | Freq: Once | INTRAMUSCULAR | Status: DC | PRN

## 2023-01-07 MED ORDER — PROPOFOL 10 MG/ML IV BOLUS
INTRAVENOUS | Status: DC | PRN
  Administered 2023-01-07: 250 mg via INTRAVENOUS

## 2023-01-07 MED ORDER — KETOROLAC TROMETHAMINE 30 MG/ML IJ SOLN: 30.0000 mg | Freq: Once | INTRAMUSCULAR | Status: DC

## 2023-01-07 MED ORDER — POVIDONE-IODINE 10 % OINT PACKET
TOPICAL_OINTMENT | CUTANEOUS | Status: DC | PRN
  Administered 2023-01-07: 1 via TOPICAL

## 2023-01-07 MED ORDER — CHLORHEXIDINE GLUCONATE 0.12 % MT SOLN: 15.0000 mL | Freq: Once | OROMUCOSAL | Status: AC

## 2023-01-07 MED ORDER — LIDOCAINE HCL (CARDIAC) PF 100 MG/5ML IV SOSY
PREFILLED_SYRINGE | INTRAVENOUS | Status: DC | PRN
  Administered 2023-01-07: 100 mg via INTRATRACHEAL

## 2023-01-07 MED ORDER — BUPIVACAINE HCL (PF) 0.5 % IJ SOLN
INTRAMUSCULAR | Status: DC | PRN
  Administered 2023-01-07: 10 mL

## 2023-01-07 MED ORDER — CHLORHEXIDINE GLUCONATE 0.12 % MT SOLN
OROMUCOSAL | Status: AC
  Administered 2023-01-07: 15 mL via OROMUCOSAL
  Filled 2023-01-07: qty 15

## 2023-01-07 MED ORDER — CHLORHEXIDINE GLUCONATE CLOTH 2 % EX PADS: 6.0000 | MEDICATED_PAD | Freq: Once | CUTANEOUS | Status: DC

## 2023-01-07 MED ORDER — POVIDONE-IODINE 10 % EX OINT
TOPICAL_OINTMENT | CUTANEOUS | Status: AC
  Filled 2023-01-07: qty 28.4

## 2023-01-07 MED ORDER — FENTANYL CITRATE (PF) 250 MCG/5ML IJ SOLN
INTRAMUSCULAR | Status: DC | PRN
  Administered 2023-01-07 (×3): 50 ug via INTRAVENOUS

## 2023-01-07 SURGICAL SUPPLY — 25 items
BLADE SURG SZ11 CARB STEEL (BLADE) ×1 IMPLANT
CANNULA VESSEL 3MM 2 BLNT TIP (CANNULA) ×1 IMPLANT
CLOTH BEACON ORANGE TIMEOUT ST (SAFETY) ×1 IMPLANT
COVER LIGHT HANDLE STERIS (MISCELLANEOUS) ×2 IMPLANT
DECANTER SPIKE VIAL GLASS SM (MISCELLANEOUS) ×1 IMPLANT
ELECT REM PT RETURN 9FT ADLT (ELECTROSURGICAL) ×1
ELECTRODE REM PT RTRN 9FT ADLT (ELECTROSURGICAL) ×1 IMPLANT
GAUZE SPONGE 4X4 12PLY STRL (GAUZE/BANDAGES/DRESSINGS) ×2 IMPLANT
GAUZE SPONGE 4X4 12PLY STRL LF (GAUZE/BANDAGES/DRESSINGS) ×1 IMPLANT
GLOVE BIOGEL PI IND STRL 7.0 (GLOVE) ×2 IMPLANT
GLOVE SURG SS PI 7.5 STRL IVOR (GLOVE) ×2 IMPLANT
GOWN STRL REUS W/TWL LRG LVL3 (GOWN DISPOSABLE) ×2 IMPLANT
KIT TURNOVER KIT A (KITS) ×1 IMPLANT
MANIFOLD NEPTUNE II (INSTRUMENTS) ×1 IMPLANT
NDL HYPO 25X1 1.5 SAFETY (NEEDLE) ×1 IMPLANT
NEEDLE HYPO 25X1 1.5 SAFETY (NEEDLE) ×1 IMPLANT
NS IRRIG 1000ML POUR BTL (IV SOLUTION) ×1 IMPLANT
PACK MINOR (CUSTOM PROCEDURE TRAY) ×1 IMPLANT
PAD ARMBOARD 7.5X6 YLW CONV (MISCELLANEOUS) ×1 IMPLANT
PENCIL SMOKE EVACUATOR (MISCELLANEOUS) ×1 IMPLANT
POSITIONER HEAD 8X9X4 ADT (SOFTGOODS) ×1 IMPLANT
SET BASIN LINEN APH (SET/KITS/TRAYS/PACK) ×1 IMPLANT
SPONGE T-LAP 18X18 ~~LOC~~+RFID (SPONGE) ×1 IMPLANT
SUT ETHILON 3 0 PS 1 (SUTURE) IMPLANT
SYR CONTROL 10ML LL (SYRINGE) ×1 IMPLANT

## 2023-01-07 NOTE — Anesthesia Procedure Notes (Signed)
Procedure Name: LMA Insertion Date/Time: 01/07/2023 1:31 PM  Performed by: Lorin Glass, CRNAPre-anesthesia Checklist: Emergency Drugs available, Patient identified, Suction available and Patient being monitored Patient Re-evaluated:Patient Re-evaluated prior to induction Oxygen Delivery Method: Circle system utilized Preoxygenation: Pre-oxygenation with 100% oxygen Induction Type: IV induction LMA: LMA inserted LMA Size: 4.0 Number of attempts: 1 Placement Confirmation: positive ETCO2 and breath sounds checked- equal and bilateral Tube secured with: Tape Dental Injury: Teeth and Oropharynx as per pre-operative assessment

## 2023-01-07 NOTE — Anesthesia Preprocedure Evaluation (Signed)
Anesthesia Evaluation  Patient identified by MRN, date of birth, ID band Patient awake    Reviewed: Allergy & Precautions, H&P , NPO status , Patient's Chart, lab work & pertinent test results, reviewed documented beta blocker date and time   Airway Mallampati: II  TM Distance: >3 FB Neck ROM: full    Dental no notable dental hx.    Pulmonary neg pulmonary ROS   Pulmonary exam normal breath sounds clear to auscultation       Cardiovascular Exercise Tolerance: Good hypertension, negative cardio ROS  Rhythm:regular Rate:Normal     Neuro/Psych   Anxiety     negative neurological ROS  negative psych ROS   GI/Hepatic negative GI ROS, Neg liver ROS,GERD  ,,  Endo/Other    Morbid obesity  Renal/GU negative Renal ROS  negative genitourinary   Musculoskeletal   Abdominal   Peds  Hematology negative hematology ROS (+)   Anesthesia Other Findings   Reproductive/Obstetrics negative OB ROS                             Anesthesia Physical Anesthesia Plan  ASA: 3  Anesthesia Plan: General and General LMA   Post-op Pain Management:    Induction:   PONV Risk Score and Plan: Ondansetron  Airway Management Planned:   Additional Equipment:   Intra-op Plan:   Post-operative Plan:   Informed Consent: I have reviewed the patients History and Physical, chart, labs and discussed the procedure including the risks, benefits and alternatives for the proposed anesthesia with the patient or authorized representative who has indicated his/her understanding and acceptance.     Dental Advisory Given  Plan Discussed with: CRNA  Anesthesia Plan Comments:        Anesthesia Quick Evaluation

## 2023-01-07 NOTE — Interval H&P Note (Signed)
History and Physical Interval Note:  01/07/2023 12:05 PM  [facility]  has presented today for surgery, with the diagnosis of PILONIDAL CYST.  The various methods of treatment have been discussed with the patient and family. After consideration of risks, benefits and other options for treatment, the patient has consented to  Procedure(s): CYST EXCISION PILONIDAL SIMPLE (N/A) as a surgical intervention.  The patient's history has been reviewed, patient examined, no change in status, stable for surgery.  I have reviewed the patient's chart and labs.  Questions were answered to the patient's satisfaction.     [name]

## 2023-01-07 NOTE — Op Note (Signed)
Patient:  [facility]  DOB:  [birth year]  MRN:  [id number]   Preop Diagnosis:  Pilonidal cyst  Postop Diagnosis:  Same  Procedure:  Excision of pilonidal cyst   Surgeon:  [name], MD    Anes:  General  Indications:  Patient is a 27yo BF who presents with a pilonidal cyst.  The risks and benefits of the procedure including bleeding, infection, and the possibility of recurrence were fully explained to the patient, who gives informed consent.  Procedure note:  The patient was placed in the right lateral decubitus position after general anesthesia was given.  The area was prepped and draped using betadine.  Surgical site confirmation was performed.  An elliptical incision was made just to the left of the coccyx.  A full thickness excision of the pilonidal cyst was performed.  Granulation tissue noted at the base.  A curette was used to excise the tissue.  The wound was irrigated with saline.  .5% Sensorcained was instilled into the wound.  The wound was closed with 3-0 Prolene sutures.  Betadine ointment and a dry sterile dressing was applied.  All tape and needle counts were correct and the end of the procedure.  The patient was awakened and transferred to PACU in stable condition.  Complications:  none  EBL:  minimal  Specimen:  none

## 2023-01-07 NOTE — Transfer of Care (Signed)
Immediate Anesthesia Transfer of Care Note  Patient: [facility]  Procedure(s) Performed: CYST EXCISION PILONIDAL SIMPLE  Patient Location: PACU  Anesthesia Type:General  Level of Consciousness: drowsy  Airway & Oxygen Therapy: Patient Spontanous Breathing and Patient connected to face mask oxygen  Post-op Assessment: Report given to RN and Post -op Vital signs reviewed and stable  Post vital signs: Reviewed and stable  Last Vitals:  Vitals Value Taken Time  BP 134/79 01/07/23 1410  Temp 98.2   Pulse 107 01/07/23 1411  Resp 22 01/07/23 1411  SpO2 100 % 01/07/23 1411  Vitals shown include unfiled device data.  Last Pain:  Vitals:   01/07/23 1153  PainSc: 0-No pain         Complications: No notable events documented.

## 2023-01-08 NOTE — Anesthesia Postprocedure Evaluation (Signed)
Anesthesia Post Note  Patient: Amy Nichols  Procedure(s) Performed: CYST EXCISION PILONIDAL SIMPLE  Patient location during evaluation: Phase II Anesthesia Type: General Level of consciousness: awake Pain management: pain level controlled Vital Signs Assessment: post-procedure vital signs reviewed and stable Respiratory status: spontaneous breathing and respiratory function stable Cardiovascular status: blood pressure returned to baseline and stable Postop Assessment: no headache and no apparent nausea or vomiting Anesthetic complications: no Comments: Late entry   No notable events documented.   Last Vitals:  Vitals:   01/07/23 1515 01/07/23 1529  BP: 110/80 99/77  Pulse: 91 88  Resp: 20   Temp:  36.8 C  SpO2: 99% 99%    Last Pain:  Vitals:   01/08/23 1327  TempSrc:   PainSc: 0-No pain                 Windell Norfolk

## 2023-01-09 ENCOUNTER — Telehealth: Payer: 59 | Admitting: Emergency Medicine

## 2023-01-09 DIAGNOSIS — M545 Low back pain, unspecified: Secondary | ICD-10-CM | POA: Diagnosis not present

## 2023-01-10 MED ORDER — NAPROXEN 375 MG PO TABS: 375.0000 mg | ORAL_TABLET | Freq: Two times a day (BID) | ORAL | 0 refills | Status: DC

## 2023-01-10 MED ORDER — CYCLOBENZAPRINE HCL 5 MG PO TABS: 5.0000 mg | ORAL_TABLET | Freq: Three times a day (TID) | ORAL | 0 refills | Status: DC | PRN

## 2023-01-10 NOTE — Progress Notes (Signed)
We are sorry that you are not feeling well.  Here is how we plan to help!  Based on what you have shared with me it looks like you mostly have acute back pain.  Acute back pain is defined as musculoskeletal pain that can resolve in 1-3 weeks with conservative treatment.  I have prescribed Naproxen 375 mg take one by mouth twice a day non-steroid anti-inflammatory (NSAID) as well as Flexeril 5 mg every eight hours as needed which is a muscle relaxer.    If you are still taking meloxicam prescribed by Dr. Louanne Skye for your knee, do not also take Naproxen - they are similar medicines. Choose one or the other to take. You can stop taking the meloxicam for now and then resume it after your back is better and you are no longer taking naproxen.   If you are taking oxycodone prescribed by Dr. Lovell Sheehan after your surgery, do not also take cyclobenzaprine - they are both sedating medicines. If you are not taking oxycodone for pain, you can take the cyclobenzaprine  Some patients experience stomach irritation or in increased heartburn with anti-inflammatory drugs.  Please keep in mind that muscle relaxer's can cause fatigue and should not be taken while at work or driving.  Back pain is very common.  The pain often gets better over time.  The cause of back pain is usually not dangerous.  Most people can learn to manage their back pain on their own.  Home Care Stay active.  Start with short walks on flat ground if you can.  Try to walk farther each day. Do not sit, drive or stand in one place for more than 30 minutes.  Do not stay in bed. Do not avoid exercise or work.  Activity can help your back heal faster. Be careful when you bend or lift an object.  Bend at your knees, keep the object close to you, and do not twist. Sleep on a firm mattress.  Lie on your side, and bend your knees.  If you lie on your back, put a pillow under your knees. Only take medicines as told by your doctor. Put ice on the injured  area. Put ice in a plastic bag Place a towel between your skin and the bag Leave the ice on for 15-20 minutes, 3-4 times a day for the first 2-3 days. 210 After that, you can switch between ice and heat packs. Ask your doctor about back exercises or massage. Avoid feeling anxious or stressed.  Find good ways to deal with stress, such as exercise.  Get Help Right Way If: Your pain does not go away with rest or medicine. Your pain does not go away in 1 week. You have new problems. You do not feel well. The pain spreads into your legs. You cannot control when you poop (bowel movement) or pee (urinate) You feel sick to your stomach (nauseous) or throw up (vomit) You have belly (abdominal) pain. You feel like you may pass out (faint). If you develop a fever.  Make Sure you: Understand these instructions. Will watch your condition Will get help right away if you are not doing well or get worse.  Your e-visit answers were reviewed by a board certified advanced clinical practitioner to complete your personal care plan.  Depending on the condition, your plan could have included both over the counter or prescription medications.  If there is a problem please reply  once you have received a response from your provider.  Your safety is important to [name].  If you have drug allergies check your prescription carefully.    You can use MyChart to ask questions about today's visit, request a non-urgent call back, or ask for a work or school excuse for 24 hours related to this e-Visit. If it has been greater than 24 hours you will need to follow up with your provider, or enter a new e-Visit to address those concerns.  You will get an e-mail in the next two days asking about your experience.  I hope that your e-visit has been valuable and will speed your recovery. Thank you for using e-visits.  I have spent 5 minutes in review of e-visit questionnaire, review and updating patient chart, medical decision  making and response to patient.   Rica Mast, PhD, FNP-BC

## 2023-01-12 ENCOUNTER — Encounter: Payer: Self-pay | Admitting: General Surgery

## 2023-01-12 ENCOUNTER — Encounter (HOSPITAL_COMMUNITY): Payer: Self-pay | Admitting: General Surgery

## 2023-01-14 ENCOUNTER — Encounter: Payer: Self-pay | Admitting: General Surgery

## 2023-01-14 ENCOUNTER — Ambulatory Visit (INDEPENDENT_AMBULATORY_CARE_PROVIDER_SITE_OTHER): Payer: 59 | Admitting: General Surgery

## 2023-01-14 VITALS — BP 138/93 | HR 94 | Temp 97.9°F | Resp 16 | Ht 69.0 in | Wt 290.0 lb

## 2023-01-14 DIAGNOSIS — Z09 Encounter for follow-up examination after completed treatment for conditions other than malignant neoplasm: Secondary | ICD-10-CM

## 2023-01-14 NOTE — Progress Notes (Signed)
Subjective:     Amy Nichols  Patient here for postoperative visit, status post excision of pilonidal cyst.  She states that she has a small opening in the incision that has drained a little blood.  No purulent drainage has been noted.  She denies any fevers. Objective:    BP (!) 138/93   Pulse 94   Temp 97.9 F (36.6 C) (Oral)   Resp 16   Ht 5\' 9"  (1.753 m)   Wt 290 lb (131.5 kg)   LMP  (LMP Unknown) Comment: negative urine preg.01/06/23  SpO2 98%   BMI 42.83 kg/m   General:  alert, cooperative, and no distress  Incision healing well.  A small opening has occurred inferiorly.  Her sutures were removed.  Silver nitrate was applied to the granulation tissue.  No purulent drainage was noted.     Assessment:    Doing well postoperatively.    Plan:   Continue keeping wound clean and dry.  No need for antibiotics.  Follow-up here as needed.  This will close by secondary intention.

## 2023-02-17 ENCOUNTER — Encounter: Payer: Self-pay | Admitting: Family Medicine

## 2023-02-17 ENCOUNTER — Ambulatory Visit: Payer: Medicaid Other | Admitting: Family Medicine

## 2023-02-23 ENCOUNTER — Telehealth: Payer: 59 | Admitting: Physician Assistant

## 2023-02-23 DIAGNOSIS — R3989 Other symptoms and signs involving the genitourinary system: Secondary | ICD-10-CM | POA: Diagnosis not present

## 2023-02-23 MED ORDER — CEPHALEXIN 500 MG PO CAPS: 500.0000 mg | ORAL_CAPSULE | Freq: Two times a day (BID) | ORAL | 0 refills | Status: AC

## 2023-02-23 NOTE — Progress Notes (Signed)

## 2023-02-23 NOTE — Progress Notes (Signed)
I have spent 5 minutes in review of e-visit questionnaire, review and updating patient chart, medical decision making and response to patient.   Mia Milan Cody Jacklynn Dehaas, PA-C    

## 2023-03-30 ENCOUNTER — Telehealth: Payer: Medicaid Other | Admitting: Family Medicine

## 2023-03-30 DIAGNOSIS — J019 Acute sinusitis, unspecified: Secondary | ICD-10-CM | POA: Diagnosis not present

## 2023-03-30 DIAGNOSIS — B9689 Other specified bacterial agents as the cause of diseases classified elsewhere: Secondary | ICD-10-CM

## 2023-03-30 MED ORDER — DOXYCYCLINE HYCLATE 100 MG PO TABS
100.0000 mg | ORAL_TABLET | Freq: Two times a day (BID) | ORAL | 0 refills | Status: AC
Start: 2023-03-30 — End: 2023-04-06

## 2023-03-30 MED ORDER — PSEUDOEPH-BROMPHEN-DM 30-2-10 MG/5ML PO SYRP: 5.0000 mL | ORAL_SOLUTION | Freq: Four times a day (QID) | ORAL | 0 refills | Status: DC | PRN

## 2023-03-30 NOTE — Progress Notes (Signed)
E-Visit for Sinus Problems  We are sorry that you are not feeling well.  Here is how we plan to help!  Based on what you have shared with me it looks like you have sinusitis.  Sinusitis is inflammation and infection in the sinus cavities of the head.  Based on your presentation I believe you most likely have Acute Bacterial Sinusitis.  This is an infection caused by bacteria and is treated with antibiotics. I have prescribed Doxycycline 100 mg by mouth twice daily for 7 days.  You may use an oral decongestant such as Mucinex D or if you have glaucoma or high blood pressure use plain Mucinex. I also ordered cough syrup for you as well to use for congestion and cough- do not use sudafed or with this medication.   . Saline nasal spray help and can safely be used as often as needed for congestion.  If you develop worsening sinus pain, fever or notice severe headache and vision changes, or if symptoms are not better after completion of antibiotic, please schedule an appointment with a health care provider.    Sinus infections are not as easily transmitted as other respiratory infection, however we still recommend that you avoid close contact with loved ones, especially the very young and elderly.  Remember to wash your hands thoroughly throughout the day as this is the number one way to prevent the spread of infection!  Home Care: Only take medications as instructed by your medical team. Complete the entire course of an antibiotic. Do not take these medications with alcohol. A steam or ultrasonic humidifier can help congestion.  You can place a towel over your head and breathe in the steam from hot water coming from a faucet. Avoid close contacts especially the very young and the elderly. Cover your mouth when you cough or sneeze. Always remember to wash your hands.  Get Help Right Away If: You develop worsening fever or sinus pain. You develop a severe head ache or visual changes. Your symptoms  persist after you have completed your treatment plan.  Make sure you Understand these instructions. Will watch your condition. Will get help right away if you are not doing well or get worse.  Thank you for choosing an e-visit.  Your e-visit answers were reviewed by a board certified advanced clinical practitioner to complete your personal care plan. Depending upon the condition, your plan could have included both over the counter or prescription medications.  Please review your pharmacy choice. Make sure the pharmacy is open so you can pick up prescription now. If there is a problem, you may contact your provider through [employer] and have the prescription routed to another pharmacy.  Your safety is important to [name]. If you have drug allergies check your prescription carefully.   For the next 24 hours you can use MyChart to ask questions about today's visit, request a non-urgent call back, or ask for a work or school excuse. You will get an email in the next two days asking about your experience. I hope that your e-visit has been valuable and will speed your recovery.  I provided 5 minutes of non face-to-face time during this encounter for chart review, medication and order placement, as well as and documentation.

## 2023-04-06 ENCOUNTER — Telehealth: Payer: Self-pay | Admitting: Family Medicine

## 2023-04-06 ENCOUNTER — Other Ambulatory Visit (HOSPITAL_COMMUNITY): Payer: Self-pay

## 2023-04-06 MED ORDER — WEGOVY 0.25 MG/0.5ML ~~LOC~~ SOAJ: 0.2500 mg | SUBCUTANEOUS | 0 refills | Status: DC

## 2023-04-06 NOTE — Telephone Encounter (Signed)
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 Per test claim, Reginal Lutes is a plan exclusion for [employer].  Initiated PA for [facility] Medicaid, but medical records (for example, chart notes, laboratory values, etc.) confirming BOTH of the following for the patient as measured within the past 45 days: [1] Baseline weight, [2] Baseline body mass index (BMI) is required.

## 2023-04-06 NOTE — Telephone Encounter (Signed)
TTC pt but no answer and VM not set up. Pt needs appt for documentation to send to insurance to get [facility] approved. If pt calls back please have her schedule with PCP.

## 2023-04-06 NOTE — Telephone Encounter (Signed)
Pt called for PA for [facility] Rx [id number] Send to [facility] in [facility]

## 2023-04-06 NOTE — Telephone Encounter (Signed)
Informed pt that a PA has already been processed and medication was denied.  Pt spoke with [facility] and was told it would be approved. States that it just needs a prior [place]. She is frustrated because a family member has the " exact same insurance" and they are able to get [facility] approved. Tried explaining to pt that the family member may have the same insurance companies but there are different plans within the companies. Pt would still like for [name] to try again.  Pt also states that she has an appt scheduled with [facility] on Dec 5th.

## 2023-04-07 ENCOUNTER — Encounter: Payer: 59 | Admitting: Nurse Practitioner

## 2023-04-07 ENCOUNTER — Telehealth: Payer: Self-pay | Admitting: Family Medicine

## 2023-04-07 NOTE — Telephone Encounter (Signed)
Copied from CRM [id number]. Topic: Clinical - Medical Advice >> Apr 07, 2023 10:54 AM Theodis Sato wrote: Reason for CRM: PT is requesting for nurse Morrie Sheldon to call her regarding a prior authorization.

## 2023-04-12 ENCOUNTER — Telehealth: Payer: Self-pay

## 2023-04-12 NOTE — Telephone Encounter (Signed)
Nvm box set up

## 2023-04-12 NOTE — Telephone Encounter (Signed)
Amy Nichols (Key: [id number]) Rx #: [id number] 0.25MG /0.5ML auto-injectors Form [employer] Electronic Prior Authorization Form (2017 NCPDP) Created 6 days ago Sent to Plan 6 minutes ago Plan Response 5 minutes ago Submit Clinical Questions less than a minute ago Determination Wait for Determination Please wait for [employer] 2017 NCPDP to return a determination.

## 2023-04-12 NOTE — Telephone Encounter (Signed)
Chart notes with weight and BMI within the past 45 days is required for PA. Please see duplicate encounter on 04/06/23.

## 2023-04-12 NOTE — Telephone Encounter (Signed)
Called ans spoke with patient she is needing a PA done on her wegovy please advise.

## 2023-04-13 NOTE — Telephone Encounter (Signed)
Pharmacy Patient Advocate Encounter  Received notification from [facility] that Prior Authorization for [facility] 0.25MG /0.5ML auto-injectors has been DENIED.  See denial reason below. No denial letter attached in CMM. Will attach denial letter to Media tab once received.   PA #/Case ID/Reference #:  [id number]   DENIAL REASON: No Chart notes confirming baseline weight and BMI within the past 45 days

## 2023-04-13 NOTE — Telephone Encounter (Signed)
This has been addressed in another telephone encounter.  

## 2023-04-13 NOTE — Telephone Encounter (Signed)
Pt made aware that the PA for Wegovy has been denied due to not having documentation of a recent BMI.  Appt made 1/6 with [facility].   Pt has an appt with [facility] this week and will discuss Wegovy with them.

## 2023-04-15 ENCOUNTER — Encounter: Payer: 59 | Admitting: Family Medicine

## 2023-05-17 ENCOUNTER — Ambulatory Visit: Payer: Medicaid Other | Admitting: Family Medicine

## 2023-05-23 ENCOUNTER — Other Ambulatory Visit: Payer: Self-pay | Admitting: Family Medicine

## 2023-05-26 ENCOUNTER — Encounter: Payer: Self-pay | Admitting: General Surgery

## 2023-05-27 ENCOUNTER — Encounter: Payer: Self-pay | Admitting: General Surgery

## 2023-05-27 ENCOUNTER — Ambulatory Visit (INDEPENDENT_AMBULATORY_CARE_PROVIDER_SITE_OTHER): Payer: Medicaid Other | Admitting: General Surgery

## 2023-05-27 VITALS — BP 144/89 | HR 98 | Temp 98.4°F | Resp 16 | Ht 69.0 in | Wt 300.0 lb

## 2023-05-27 DIAGNOSIS — L0591 Pilonidal cyst without abscess: Secondary | ICD-10-CM | POA: Diagnosis not present

## 2023-05-27 NOTE — Progress Notes (Signed)
Subjective:     Amy Nichols  Patient presents back with drainage from her pilonidal region.  She states a small hole and opened up and was trying to drain cloudy material.  She had some amoxicillin at home and started that on her own.  She denies any fever or chills. Objective:    BP (!) 144/89   Pulse 98   Temp 98.4 F (36.9 C) (Oral)   Resp 16   Ht 5\' 9"  (1.753 m)   Wt 300 lb (136.1 kg)   SpO2 98%   BMI 44.30 kg/m   General:  alert, cooperative, and no distress  Head is normocephalic, atraumatic Lungs clear to auscultation with equal breath sounds bilaterally Heart examination reveals regular rate rhythm without S3, S4, murmurs Pilonidal region with a punctate draining hole along the midportion of the incision.  Silver nitrate stick was applied to the granulation tissue.  No significant induration is noted.     Assessment:    Pilonidal cyst with a recurrent punctate wound along the incision.  This does not appear to be grossly infected.    Plan:   Keep wound clean and dry with soap and water.  Follow-up here in 2 weeks for examination.

## 2023-05-27 NOTE — Telephone Encounter (Signed)
Surgical Date: 01/07/2023 Procedure: CYST EXCISION PILONIDAL SIMPLE   Call placed to patient.   Reports that she has opening in surgical incision that has not healed from August 2024. States that she now has some drainage from area as well. Also reports pain and tenderness to area and warmth to touch.   Appointment scheduled.

## 2023-06-02 ENCOUNTER — Telehealth: Payer: BLUE CROSS/BLUE SHIELD

## 2023-06-02 DIAGNOSIS — H5710 Ocular pain, unspecified eye: Secondary | ICD-10-CM

## 2023-06-03 NOTE — Progress Notes (Signed)
  Because of severe eye pain mentioned and need for examination, I feel your condition warrants further evaluation and I recommend that you be seen in a face-to-face visit.   NOTE: There will be NO CHARGE for this E-Visit   If you are having a true medical emergency, please call 911.     For an urgent face to face visit, [facility] has multiple urgent care centers for your convenience.  Click the link below for the full list of locations and hours, walk-in wait times, appointment scheduling options and driving directions:  Urgent Care - [place], [place], [place], [place], [place], [place]  [place]     Your MyChart E-visit questionnaire answers were reviewed by a board certified advanced clinical practitioner to complete your personal care plan based on your specific symptoms.    Thank you for using e-Visits.

## 2023-06-09 ENCOUNTER — Encounter (INDEPENDENT_AMBULATORY_CARE_PROVIDER_SITE_OTHER): Payer: Medicaid Other | Admitting: Physician Assistant

## 2023-06-10 ENCOUNTER — Ambulatory Visit: Payer: BLUE CROSS/BLUE SHIELD | Admitting: General Surgery

## 2023-06-11 ENCOUNTER — Ambulatory Visit: Payer: Medicaid Other | Admitting: Family Medicine

## 2023-06-15 ENCOUNTER — Encounter: Payer: Self-pay | Admitting: Family Medicine

## 2023-06-15 ENCOUNTER — Encounter: Payer: Self-pay | Admitting: General Surgery

## 2023-06-15 ENCOUNTER — Ambulatory Visit (INDEPENDENT_AMBULATORY_CARE_PROVIDER_SITE_OTHER): Payer: Medicaid Other | Admitting: General Surgery

## 2023-06-15 VITALS — BP 103/76 | HR 102 | Temp 97.4°F | Resp 16 | Ht 69.0 in | Wt 293.0 lb

## 2023-06-15 DIAGNOSIS — Z09 Encounter for follow-up examination after completed treatment for conditions other than malignant neoplasm: Secondary | ICD-10-CM

## 2023-06-15 NOTE — Progress Notes (Signed)
 Subjective:     Amy Nichols  Patient here for wound check, status post excision of pilonidal cyst.  She did have a punctate wound present which had silver  nitrate applied.  She states she is still having intermittent episodes of clear yellow drainage.  No fevers or purulent drainage noted. Objective:    BP 103/76   Pulse (!) 102   Temp (!) 97.4 F (36.3 C) (Oral)   Resp 16   Ht 5' 9 (1.753 m)   Wt 293 lb (132.9 kg)   SpO2 97%   BMI 43.27 kg/m   General:  alert, cooperative, and no distress  Incision healing well.  Pinhole opening is present.  It does not go deep.  Silver  nitrate was applied.     Assessment:    Doing well postoperatively. Pinhole opening which should close on its own by secondary intention.    Plan:   Keep wound clean and dry with soap and water.  Neosporin ointment as needed.  Follow-up here as needed.

## 2023-06-17 ENCOUNTER — Other Ambulatory Visit: Payer: Self-pay | Admitting: Family Medicine

## 2023-06-17 NOTE — Telephone Encounter (Signed)
 Dettinger pt NTBS 30-d given 05/24/23

## 2023-06-17 NOTE — Telephone Encounter (Signed)
 Appt scheduled for 07/14/2023, please send refill to pharm

## 2023-06-18 MED ORDER — OMEPRAZOLE 40 MG PO CPDR: 40.0000 mg | DELAYED_RELEASE_CAPSULE | Freq: Two times a day (BID) | ORAL | 0 refills | Status: DC

## 2023-06-18 NOTE — Addendum Note (Signed)
 Addended by: Akyah Lagrange D on: 06/18/2023 08:16 AM   Modules accepted: Orders

## 2023-06-25 ENCOUNTER — Other Ambulatory Visit (HOSPITAL_COMMUNITY): Payer: Self-pay

## 2023-06-25 ENCOUNTER — Telehealth: Payer: Self-pay | Admitting: Pharmacy Technician

## 2023-06-25 NOTE — Telephone Encounter (Signed)
Prior Authorization form/request asks a question that requires your assistance. Please see the question below and advise accordingly. The PA will not be submitted until the necessary information is received.   Amy Nichols (Key: [id number]) Rx #: [id number] 0.25MG /0.5ML auto-injectors      Last time her weight was documented was back in July when she first started [facility]. If patient has not lost at least 5% of pretreatment weight then the PA will be denied. Please advise.

## 2023-06-25 NOTE — Telephone Encounter (Signed)
No anwer and vmail not set up. Pt has an appt March 5th for med refill.

## 2023-06-25 NOTE — Telephone Encounter (Signed)
Please let patient know that she needs to be seen if she wants her insurance to continue to cover the weight loss medicine.  It requires [name] to see her at least every 6 months

## 2023-07-01 ENCOUNTER — Ambulatory Visit: Payer: Medicaid Other | Admitting: Family Medicine

## 2023-07-14 ENCOUNTER — Ambulatory Visit: Payer: Medicaid Other | Admitting: Family Medicine

## 2023-07-16 ENCOUNTER — Ambulatory Visit: Admitting: Family Medicine

## 2023-07-19 ENCOUNTER — Encounter: Payer: Self-pay | Admitting: Family Medicine

## 2023-07-20 ENCOUNTER — Other Ambulatory Visit: Payer: Self-pay | Admitting: Family Medicine

## 2023-07-29 ENCOUNTER — Ambulatory Visit: Payer: Medicaid Other | Admitting: Family Medicine

## 2023-08-06 ENCOUNTER — Ambulatory Visit: Admitting: Family Medicine

## 2023-08-19 ENCOUNTER — Encounter

## 2023-09-08 ENCOUNTER — Telehealth: Admitting: Physician Assistant

## 2023-09-08 DIAGNOSIS — J069 Acute upper respiratory infection, unspecified: Secondary | ICD-10-CM | POA: Diagnosis not present

## 2023-09-08 MED ORDER — LIDOCAINE VISCOUS HCL 2 % MT SOLN: OROMUCOSAL | 0 refills | Status: DC

## 2023-09-08 MED ORDER — PSEUDOEPH-BROMPHEN-DM 30-2-10 MG/5ML PO SYRP: 5.0000 mL | ORAL_SOLUTION | Freq: Four times a day (QID) | ORAL | 0 refills | Status: DC | PRN

## 2023-09-08 MED ORDER — AZELASTINE HCL 0.1 % NA SOLN: 1.0000 | Freq: Two times a day (BID) | NASAL | 0 refills | Status: DC

## 2023-09-08 NOTE — Progress Notes (Signed)
 E-Visit for Upper Respiratory Infection   We are sorry you are not feeling well.  Here is how we plan to help!  Based on what you have shared with me, it looks like you may have a viral upper respiratory infection.  Upper respiratory infections are caused by a large number of viruses; however, rhinovirus is the most common cause.   Symptoms vary from person to person, with common symptoms including sore throat, cough, fatigue or lack of energy and feeling of general discomfort.  A low-grade fever of up to 100.4 may present, but is often uncommon.  Symptoms vary however, and are closely related to a person's age or underlying illnesses.  The most common symptoms associated with an upper respiratory infection are nasal discharge or congestion, cough, sneezing, headache and pressure in the ears and face.  These symptoms usually persist for about 3 to 10 days, but can last up to 2 weeks.  It is important to know that upper respiratory infections do not cause serious illness or complications in most cases.    Upper respiratory infections can be transmitted from person to person, with the most common method of transmission being a person's hands.  The virus is able to live on the skin and can infect other persons for up to 2 hours after direct contact.  Also, these can be transmitted when someone coughs or sneezes; thus, it is important to cover the mouth to reduce this risk.  To keep the spread of the illness at bay, good hand hygiene is very important.  This is an infection that is most likely caused by a virus. There are no specific treatments other than to help you with the symptoms until the infection runs its course.  We are sorry you are not feeling well.  Here is how we plan to help!   For nasal congestion, you may use an oral decongestants such as Mucinex  D or if you have glaucoma or high blood pressure use plain Mucinex .  Saline nasal spray or nasal drops can help and can safely be used as often as  needed for congestion.  For your congestion, I have prescribed Azelastine  nasal spray two sprays in each nostril twice a day  If you do not have a history of heart disease, hypertension, diabetes or thyroid disease, prostate/bladder issues or glaucoma, you may also use Sudafed to treat nasal congestion.  It is highly recommended that you consult with a pharmacist or your primary care physician to ensure this medication is safe for you to take.     For cough I have prescribed for you Bromfed DM cough syrup Take 5mL every 6 hours as needed for cough.  If you have a sore or scratchy throat, use a saltwater gargle-  to  teaspoon of salt dissolved in a 4-ounce to 8-ounce glass of warm water.  Gargle the solution for approximately 15-30 seconds and then spit.  It is important not to swallow the solution.  You can also use throat lozenges/cough drops and Chloraseptic spray to help with throat pain or discomfort.  Warm or cold liquids can also be helpful in relieving throat pain. For your sore throat, I have prescribed Viscous Lidocaine  2% Swallow 5mL every 6 hours as needed for sore throat. Do not eat or drink anything for 15 minutes after using to allow medication to absorb in the throat to help with throat pain.   For headache, pain or general discomfort, you can use Ibuprofen  or Tylenol  as directed.  Some authorities believe that zinc sprays or the use of Echinacea may shorten the course of your symptoms.   HOME CARE Only take medications as instructed by your medical team. Be sure to drink plenty of fluids. Water is fine as well as fruit juices, sodas and electrolyte beverages. You may want to stay away from caffeine or alcohol. If you are nauseated, try taking small sips of liquids. How do you know if you are getting enough fluid? Your urine should be a pale yellow or almost colorless. Get rest. Taking a steamy shower or using a humidifier may help nasal congestion and ease sore throat pain. You can  place a towel over your head and breathe in the steam from hot water coming from a faucet. Using a saline nasal spray works much the same way. Cough drops, hard candies and sore throat lozenges may ease your cough. Avoid close contacts especially the very young and the elderly Cover your mouth if you cough or sneeze Always remember to wash your hands.   GET HELP RIGHT AWAY IF: You develop worsening fever. If your symptoms do not improve within 10 days You develop yellow or green discharge from your nose over 3 days. You have coughing fits You develop a severe head ache or visual changes. You develop shortness of breath, difficulty breathing or start having chest pain Your symptoms persist after you have completed your treatment plan  MAKE SURE YOU  Understand these instructions. Will watch your condition. Will get help right away if you are not doing well or get worse.  Thank you for choosing an e-visit.  Your e-visit answers were reviewed by a board certified advanced clinical practitioner to complete your personal care plan. Depending upon the condition, your plan could have included both over the counter or prescription medications.  Please review your pharmacy choice. Make sure the pharmacy is open so you can pick up prescription now. If there is a problem, you may contact your provider through [employer] and have the prescription routed to another pharmacy.  Your safety is important to us . If you have drug allergies check your prescription carefully.   For the next 24 hours you can use MyChart to ask questions about today's visit, request a non-urgent call back, or ask for a work or school excuse. You will get an email in the next two days asking about your experience. I hope that your e-visit has been valuable and will speed your recovery.     I have spent 5 minutes in review of e-visit questionnaire, review and updating patient chart, medical decision making and  response to patient.   Angelia Kelp, PA-C

## 2023-09-18 ENCOUNTER — Telehealth: Admitting: Nurse Practitioner

## 2023-09-18 DIAGNOSIS — H6993 Unspecified Eustachian tube disorder, bilateral: Secondary | ICD-10-CM | POA: Diagnosis not present

## 2023-09-19 NOTE — Progress Notes (Signed)
 I recommend continuing the nasal spray daily and zyrtec  daily at this time.  E-Visit for Ear Pain - Eustachian Tube Dysfunction   We are sorry that you are not feeling well. Here is how we plan to help!  Based on what you have shared with me it looks like you have Eustachian Tube Dysfunction.  Eustachian Tube Dysfunction is a condition where the tubes that connect your middle ears to your upper throat become blocked. This can lead to discomfort, hearing difficulties and a feeling of fullness in your ear. Eustachian tube dysfunction usually resolves itself in a few days. The usual symptoms include: Hearing problems Tinnitus, or ringing in your ears Clicking or popping sounds A feeling of fullness in your ears Pain that mimics an ear infection Dizziness, vertigo or balance problems A "tickling" sensation in your ears  ?Eustachian tube dysfunction symptoms may get worse in higher altitudes. This is called barotrauma, and it can happen while scuba diving, flying in an airplane or driving in the mountains.   What causes eustachian tube dysfunction? Allergies and infections (like the common cold and the flu) are the most common causes of eustachian tube dysfunction. These conditions can cause inflammation and mucus buildup, leading to blockage. GERD, or chronic acid reflux, can also cause ETD. This is because stomach acid can back up into your throat and result in inflammation. As mentioned above, altitude changes can also cause ETD.   What are some common eustachian tube dysfunction treatments? In most cases, treatment isn't necessary because ETD often resolves on its own. However, you might need treatment if your symptoms linger for more than two weeks.    Eustachian tube dysfunction treatment depends on the cause and the severity of your condition. Treatments may include home remedies, medications or, in severe cases, surgery.     HOME CARE: Sometimes simple home remedies can help with mild  cases of eustachian tube dysfunction. To try and clear the blockage, you can: Chew gum. Yawn. Swallow. Try the Valsalva maneuver (breathing out forcefully while closing your mouth and pinching your nostrils). Use a saline spray to clear out nasal passages.  MEDICATIONS: Over-the-counter medications can help if allergies are causing eustachian tube dysfunction. Try antihistamines (like cetirizine  or diphenhydramine) to ease your symptoms. If you have discomfort, pain relievers -- such as acetaminophen  or ibuprofen  -- can help.  Sometimes intranasal glucocorticosteroids (like Flonase  or Nasacort) help.   GET HELP RIGHT AWAY IF: Fever is over 102.2 degrees. You develop progressive ear pain or hearing loss. Ear symptoms persist longer than 5 days after treatment.  MAKE SURE YOU: Understand these instructions. Will watch your condition. Will get help right away if you are not doing well or get worse.  Thank you for choosing an e-visit.  Your e-visit answers were reviewed by a board certified advanced clinical practitioner to complete your personal care plan. Depending upon the condition, your plan could have included both over the counter or prescription medications.  Please review your pharmacy choice. Make sure the pharmacy is open so you can pick up the prescription now. If there is a problem, you may contact your provider through [employer] and have the prescription routed to another pharmacy.  Your safety is important to us . If you have drug allergies check your prescription carefully.   For the next 24 hours you can use MyChart to ask questions about today's visit, request a non-urgent call back, or ask for a work or school excuse. You will get an email  with a survey after your eVisit asking about your experience. We would appreciate your feedback. I hope that your e-visit has been valuable and will aid in your recovery.

## 2023-09-19 NOTE — Progress Notes (Signed)
 I have spent 5 minutes in review of e-visit questionnaire, review and updating patient chart, medical decision making and response to patient.   Claiborne Rigg, NP

## 2023-10-21 ENCOUNTER — Ambulatory Visit: Admitting: Family Medicine

## 2023-10-21 ENCOUNTER — Encounter: Payer: Self-pay | Admitting: Family Medicine

## 2023-10-21 ENCOUNTER — Other Ambulatory Visit: Payer: Self-pay | Admitting: Family Medicine

## 2023-10-21 VITALS — BP 126/83 | HR 91 | Temp 98.3°F | Ht 69.0 in | Wt 289.0 lb

## 2023-10-21 DIAGNOSIS — F419 Anxiety disorder, unspecified: Secondary | ICD-10-CM | POA: Diagnosis not present

## 2023-10-21 MED ORDER — DESVENLAFAXINE SUCCINATE ER 50 MG PO TB24: 50.0000 mg | ORAL_TABLET | Freq: Every day | ORAL | 3 refills | Status: DC

## 2023-10-21 NOTE — Progress Notes (Signed)
 BP (!) 136/93   Pulse 91   Temp 98.3 F (36.8 C) (Temporal)   Ht 5' 9 (1.753 m)   Wt 289 lb (131.1 kg)   SpO2 100%   BMI 42.68 kg/m    Subjective:   Patient ID: Amy Nichols, female    DOB: [birth year], 28 y.o.   MRN: [id number]  HPI: Amy Nichols is a 28 y.o. female presenting on 10/21/2023 for Anxiety (Feels like anxiety is getting worse and would like to try another medication to help. Possibly try adhd medicine )   HPI Anxiety recheck Patient is coming in today for anxiety recheck.  In the past she has tried Wellbutrin  and Cymbalta  and Paxil  and Zoloft  and trazodone  for sleep she feels like she gets very fidgety and anxious and is also affecting her sleep as well and the trazodone  is not working as well as before.  She denies any suicidal ideations or thoughts of hurting herself.  Her PHQ-9 today is 6 and her GAD-7 is 18.  She would like to try something to help calm her nerves.  She is also concerned about her blood pressure being more elevated recently and she has been having some mild headaches as well with that.  She does not know if those are because of her anxiety or if she has other issues.  She does admit to vaping nicotine and she is working on reducing it.    11/16/2022    3:55 PM 07/20/2022   11:09 AM 06/24/2022   12:50 PM 05/15/2022    1:02 PM 09/29/2021    8:28 AM  Depression screen PHQ 2/9  Decreased Interest 0 0 3 3   Down, Depressed, Hopeless 0 0 2 2   PHQ - 2 Score 0 0 5 5   Altered sleeping 0 3 3 3 3   Tired, decreased energy 0 0 3 3 0  Change in appetite 0 3 3 3  0  Feeling bad or failure about yourself  0 0 2 2 0  Trouble concentrating 0 0 3 3 0  Moving slowly or fidgety/restless 0 0 3 3 0  Suicidal thoughts 0 0 0 0 0  PHQ-9 Score 0 6 22 22    Difficult doing work/chores  Not difficult at all Somewhat difficult Somewhat difficult Not difficult at all     Relevant past medical, surgical, family and social history reviewed and updated as indicated.  Interim medical history since our last visit reviewed. Allergies and medications reviewed and updated.  Review of Systems  Constitutional:  Negative for chills and fever.  Eyes:  Negative for redness and visual disturbance.  Respiratory:  Negative for chest tightness and shortness of breath.   Cardiovascular:  Negative for chest pain and leg swelling.  Skin:  Negative for rash.  Neurological:  Negative for light-headedness and headaches.  Psychiatric/Behavioral:  Positive for dysphoric mood and sleep disturbance. Negative for agitation, behavioral problems, self-injury and suicidal ideas. The patient is nervous/anxious.   All other systems reviewed and are negative.   Per HPI unless specifically indicated above   Allergies as of 10/21/2023       Reactions   Tape Swelling   Clear tape, swells arms        Medication List        Accurate as of October 21, 2023  9:35 AM. If you have any questions, ask your nurse or doctor.          albuterol  108 (90 Base) MCG/ACT  inhaler Commonly known as: VENTOLIN  HFA Inhale 2 puffs into the lungs every 6 (six) hours as needed for wheezing or shortness of breath.   azelastine  0.1 % nasal spray Commonly known as: ASTELIN  Place 1 spray into both nostrils 2 (two) times daily. Use in each nostril as directed   brompheniramine-pseudoephedrine -DM 30-2-10 MG/5ML syrup Take 5 mLs by mouth 4 (four) times daily as needed.   cetirizine  10 MG tablet Commonly known as: ZYRTEC  Take 1 tablet (10 mg total) by mouth daily.   cyclobenzaprine  5 MG tablet Commonly known as: FLEXERIL  Take 1 tablet (5 mg total) by mouth 3 (three) times daily as needed for muscle spasms.   desvenlafaxine 50 MG 24 hr tablet Commonly known as: Pristiq Take 1 tablet (50 mg total) by mouth daily. Started by: Lucio Sabin Wonder Donaway   fluticasone  50 MCG/ACT nasal spray Commonly known as: FLONASE  Place 2 sprays into both nostrils daily. What changed:  when to take  this reasons to take this   ibuprofen  800 MG tablet Commonly known as: ADVIL  Take 1 tablet (800 mg total) by mouth every 8 (eight) hours as needed.   levonorgestrel  20 MCG/DAY Iud Commonly known as: MIRENA  1 each by Intrauterine route once.   lidocaine  2 % solution Commonly known as: XYLOCAINE  Swallow 5mL every 6 hours as needed for sore throat   meloxicam  15 MG tablet Commonly known as: MOBIC  Take 1 tablet (15 mg total) by mouth daily.   omeprazole  40 MG capsule Commonly known as: PRILOSEC TAKE 1 CAPSULE (40 MG TOTAL) BY MOUTH 2 (TWO) TIMES DAILY BEFORE A MEAL.   traZODone  100 MG tablet Commonly known as: DESYREL  Take 1 tablet (100 mg total) by mouth at bedtime.   Wegovy  0.25 MG/0.5ML Soaj Generic drug: Semaglutide -Weight Management Inject 0.25 mg into the skin once a week.         Objective:   BP (!) 136/93   Pulse 91   Temp 98.3 F (36.8 C) (Temporal)   Ht 5' 9 (1.753 m)   Wt 289 lb (131.1 kg)   SpO2 100%   BMI 42.68 kg/m   Wt Readings from Last 3 Encounters:  10/21/23 289 lb (131.1 kg)  06/15/23 293 lb (132.9 kg)  05/27/23 300 lb (136.1 kg)    Physical Exam Vitals and nursing note reviewed.  Constitutional:      General: She is not in acute distress.    Appearance: She is well-developed. She is not diaphoretic.   Eyes:     Conjunctiva/sclera: Conjunctivae normal.    Cardiovascular:     Rate and Rhythm: Normal rate and regular rhythm.     Heart sounds: Normal heart sounds. No murmur heard. Pulmonary:     Effort: Pulmonary effort is normal. No respiratory distress.     Breath sounds: Normal breath sounds. No wheezing.   Musculoskeletal:        General: No swelling.   Skin:    General: Skin is warm and dry.     Findings: No rash.   Neurological:     Mental Status: She is alert and oriented to person, place, and time.     Coordination: Coordination normal.   Psychiatric:        Behavior: Behavior normal.       Assessment & Plan:    Problem List Items Addressed This Visit       Other   Anxiety - Primary   Relevant Medications   desvenlafaxine (PRISTIQ) 50 MG 24 hr tablet  Will start Pristiq, discussed [facility] testing but she will keep in mind for the future.  Will try the Pristiq first and see how she does not come back in 4 or 5 weeks Follow up plan: Return if symptoms worsen or fail to improve, for anxiety.  Counseling provided for all of the vaccine components No orders of the defined types were placed in this encounter.   Jolyne Needs, MD [facility] 10/21/2023, 9:35 AM

## 2023-10-25 ENCOUNTER — Encounter: Payer: Self-pay | Admitting: Family Medicine

## 2023-10-25 DIAGNOSIS — F419 Anxiety disorder, unspecified: Secondary | ICD-10-CM

## 2023-10-26 ENCOUNTER — Telehealth: Admitting: Physician Assistant

## 2023-10-26 DIAGNOSIS — B001 Herpesviral vesicular dermatitis: Secondary | ICD-10-CM | POA: Diagnosis not present

## 2023-10-27 MED ORDER — VALACYCLOVIR HCL 1 G PO TABS: 2000.0000 mg | ORAL_TABLET | Freq: Two times a day (BID) | ORAL | 0 refills | Status: AC

## 2023-10-27 NOTE — Progress Notes (Signed)
 E-Visit for [employer]  We are sorry that you are not feeling well.  Here is how we plan to help!  Based on what you have shared with me it does look like you have a viral infection.    Most cold sores or fever blisters are small fluid filled blisters around the mouth caused by herpes simplex virus.  The most common strain of the virus causing cold sores is herpes simplex virus 1.  It can be spread by skin contact, sharing eating utensils, or even sharing towels.  Cold sores are contagious to other people until dry. (Approximately 5-7 days).  Wash your hands. You can spread the virus to your eyes through handling your contact lenses after touching the lesions.  Most people experience pain at the sight or tingling sensations in their lips that may begin before the ulcers erupt.  Herpes simplex is treatable but not curable.  It may lie dormant for a long time and then reappear due to stress or prolonged sun exposure.  Many patients have success in treating their cold sores with an over the counter topical called Abreva.  You may apply the cream up to 5 times daily (maximum 10 days) until healing occurs.  If you would like to use an oral antiviral medication to speed the healing of your cold sore, I have sent a prescription to your local pharmacy Valacyclovir  2 gm take one by mouth twice a day for 1 day    HOME CARE:  Wash your hands frequently. Do not pick at or rub the sore. Don't open the blisters. Avoid kissing other people during this time. Avoid sharing drinking glasses, eating utensils, or razors. Do not handle contact lenses unless you have thoroughly washed your hands with soap and warm water! Avoid oral sex during this time.  Herpes from sores on your mouth can spread to your partner's genital area. Avoid contact with anyone who has eczema or a weakened immune system. Cold sores are often triggered by exposure to intense sunlight, use a lip balm containing a sunscreen (SPF 30 or  higher).  GET HELP RIGHT AWAY IF:  Blisters look infected. Blisters occur near or in the eye. Symptoms last longer than 10 days. Your symptoms become worse.  MAKE SURE YOU:  Understand these instructions. Will watch your condition. Will get help right away if you are not doing well or get worse.    Your e-visit answers were reviewed by a board certified advanced clinical practitioner to complete your personal care plan.  Depending upon the condition, your plan could have  Included both over the counter or prescription medications.    Please review your pharmacy choice.  Be sure that the pharmacy you have chosen is open so that you can pick up your prescription now.  If there is a problem you can message your provider in MyChart to have the prescription routed to another pharmacy.    Your safety is important to us .  If you have drug allergies check our prescription carefully.  For the next 24 hours you can use MyChart to ask questions about today's visit, request a non-urgent call back, or ask for a work or school excuse from your e-visit provider.  You will get an email in the next two days asking about your experience.  I hope that your e-visit has been valuable and will speed your recovery.  Approximately 5 minutes was spent documenting and reviewing patient's chart.

## 2023-11-12 ENCOUNTER — Other Ambulatory Visit: Payer: Self-pay | Admitting: Family Medicine

## 2023-11-12 DIAGNOSIS — F419 Anxiety disorder, unspecified: Secondary | ICD-10-CM

## 2023-11-17 ENCOUNTER — Other Ambulatory Visit: Payer: Self-pay | Admitting: Family Medicine

## 2023-11-17 DIAGNOSIS — M25561 Pain in right knee: Secondary | ICD-10-CM

## 2023-11-18 ENCOUNTER — Encounter: Payer: Self-pay | Admitting: Family Medicine

## 2023-11-18 ENCOUNTER — Ambulatory Visit: Admitting: Family Medicine

## 2023-11-18 VITALS — BP 118/87 | HR 87 | Ht 69.0 in | Wt 286.0 lb

## 2023-11-18 DIAGNOSIS — L0591 Pilonidal cyst without abscess: Secondary | ICD-10-CM | POA: Diagnosis not present

## 2023-11-18 DIAGNOSIS — F419 Anxiety disorder, unspecified: Secondary | ICD-10-CM | POA: Diagnosis not present

## 2023-11-18 MED ORDER — SULFAMETHOXAZOLE-TRIMETHOPRIM 800-160 MG PO TABS: 1.0000 | ORAL_TABLET | Freq: Two times a day (BID) | ORAL | 0 refills | Status: DC

## 2023-11-18 MED ORDER — DESVENLAFAXINE SUCCINATE ER 50 MG PO TB24: 50.0000 mg | ORAL_TABLET | Freq: Every day | ORAL | 1 refills | Status: DC

## 2023-11-18 NOTE — Progress Notes (Signed)
 BP 118/87   Pulse 87   Ht 5' 9 (1.753 m)   Wt 286 lb (129.7 kg)   SpO2 98%   BMI 42.23 kg/m    Subjective:   Patient ID: Amy Nichols, female    DOB: 1995-06-24, 28 y.o.   MRN: [id number]  HPI: Amy Nichols is a 28 y.o. female presenting on 11/18/2023 for Medical Management of Chronic Issues, Anxiety, and Cyst (Buttocks- burst, painful. S/P excision with [facility]. )   HPI Anxiety recheck Patient is coming in today for 1 month anxiety recheck.  She is currently taking Pristiq  50 mg daily and uses trazodone  at night.  Patient feels like she is doing really well and the only real issue that she gets with the Pristiq  is that she does fight some constipation with it.  She says has not been proving completely and she has just recently started taking MiraLAX  and has been increasing fiber.  She says she is very happy with the medicine and wants to continue forward with it.  Gave her some tips on what to do to relieve the constipation and hopefully as her body gets used to the medication will reset.    11/18/2023    9:20 AM 10/21/2023    9:44 AM 11/16/2022    3:55 PM 07/20/2022   11:09 AM 06/24/2022   12:50 PM  Depression screen PHQ 2/9  Decreased Interest 0 0 0 0 3  Down, Depressed, Hopeless 0 0 0 0 2  PHQ - 2 Score 0 0 0 0 5  Altered sleeping 0 3 0 3 3  Tired, decreased energy 0 0 0 0 3  Change in appetite 3 3 0 3 3  Feeling bad or failure about yourself  0 0 0 0 2  Trouble concentrating 0 0 0 0 3  Moving slowly or fidgety/restless 0 0 0 0 3  Suicidal thoughts 0 0 0 0 0  PHQ-9 Score 3 6 0 6 22  Difficult doing work/chores Not difficult at all Not difficult at all  Not difficult at all Somewhat difficult    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  She says it does not drain excessively but does  drain some purulent material and has been exquisitely painful.  She does not have an appointment to go back to the surgeon yet but she is planning to call to get 1.  Relevant past medical, surgical, family and social history reviewed and updated as indicated. Interim medical history since our last visit reviewed. Allergies and medications reviewed and updated.  Review of Systems  Constitutional:  Negative for chills and fever.  Eyes:  Negative for visual disturbance.  Respiratory:  Negative for chest tightness and shortness of breath.   Cardiovascular:  Negative for chest pain and leg swelling.  Gastrointestinal:  Positive for constipation.  Skin:  Positive for color change and wound. Negative for rash.  Neurological:  Negative for dizziness, light-headedness and headaches.  Psychiatric/Behavioral:  Negative for agitation, behavioral problems, dysphoric mood and sleep disturbance. The patient is nervous/anxious.   All other systems reviewed and are negative.   Per HPI unless specifically indicated above   Allergies as of 11/18/2023       Reactions   Tape Swelling   Clear tape, swells  arms        Medication List        Accurate as of November 18, 2023  9:48 AM. If you have any questions, ask your nurse or doctor.          STOP taking these medications    brompheniramine-pseudoephedrine -DM 30-2-10 MG/5ML syrup Stopped by: Fonda LABOR Shley Dolby   cetirizine  10 MG tablet Commonly known as: ZYRTEC  Stopped by: Fonda LABOR Ammara Raj   cyclobenzaprine  5 MG tablet Commonly known as: FLEXERIL  Stopped by: Fonda LABOR Joellyn Grandt   lidocaine  2 % solution Commonly known as: XYLOCAINE  Stopped by: Fonda LABOR Glynn Freas   meloxicam  15 MG tablet Commonly known as: MOBIC  Stopped by: Fonda LABOR Marieli Rudy   Wegovy  0.25 MG/0.5ML Soaj Generic drug: Semaglutide -Weight Management Stopped by: Fonda LABOR Jariyah Hackley       TAKE these medications    albuterol  108 (90 Base) MCG/ACT inhaler Commonly  known as: VENTOLIN  HFA Inhale 2 puffs into the lungs every 6 (six) hours as needed for wheezing or shortness of breath.   azelastine  0.1 % nasal spray Commonly known as: ASTELIN  Place 1 spray into both nostrils 2 (two) times daily. Use in each nostril as directed   desvenlafaxine  50 MG 24 hr tablet Commonly known as: PRISTIQ  Take 1 tablet (50 mg total) by mouth daily.   fluticasone  50 MCG/ACT nasal spray Commonly known as: FLONASE  Place 2 sprays into both nostrils daily. What changed:  when to take this reasons to take this   ibuprofen  800 MG tablet Commonly known as: ADVIL  Take 1 tablet (800 mg total) by mouth every 8 (eight) hours as needed.   levonorgestrel  20 MCG/DAY Iud Commonly known as: MIRENA  1 each by Intrauterine route once.   omeprazole  40 MG capsule Commonly known as: PRILOSEC TAKE 1 CAPSULE (40 MG TOTAL) BY MOUTH 2 (TWO) TIMES DAILY BEFORE A MEAL.   sulfamethoxazole -trimethoprim  800-160 MG tablet Commonly known as: BACTRIM  DS Take 1 tablet by mouth 2 (two) times daily. Started by: Fonda LABOR Raynesha Tiedt   traZODone  100 MG tablet Commonly known as: DESYREL  Take 1 tablet (100 mg total) by mouth at bedtime.         Objective:   BP 118/87   Pulse 87   Ht 5' 9 (1.753 m)   Wt 286 lb (129.7 kg)   SpO2 98%   BMI 42.23 kg/m   Wt Readings from Last 3 Encounters:  11/18/23 286 lb (129.7 kg)  10/21/23 289 lb (131.1 kg)  06/15/23 293 lb (132.9 kg)    Physical Exam Vitals and nursing note reviewed.  Constitutional:      General: She is not in acute distress.    Appearance: She is well-developed. She is not diaphoretic.  Eyes:     Conjunctiva/sclera: Conjunctivae normal.  Cardiovascular:     Rate and Rhythm: Normal rate and regular rhythm.     Heart sounds: Normal heart sounds. No murmur heard. Pulmonary:     Effort: Pulmonary effort is normal. No respiratory distress.     Breath sounds: Normal breath sounds. No wheezing.  Skin:    General: Skin is  warm and dry.     Findings: Erythema and wound present.      Neurological:     Mental Status: She is alert and oriented to person, place, and time.     Coordination: Coordination normal.  Psychiatric:        Behavior: Behavior normal.       Assessment & Plan:   Problem List  Items Addressed This Visit       Musculoskeletal and Integument   Pilonidal cyst   Relevant Medications   sulfamethoxazole -trimethoprim  (BACTRIM  DS) 800-160 MG tablet     Other   Anxiety - Primary   Relevant Medications   desvenlafaxine  (PRISTIQ ) 50 MG 24 hr tablet    Will start Bactrim  for the pilonidal cyst and she is going to contact her surgeon.  Continue Pristiq , seems to be doing well, recommended she do MiraLAX  twice a day over the next few days to clean everything out and drink plenty of fluids and eat plenty of fiber. Follow up plan: Return in about 3 months (around 02/18/2024), or if symptoms worsen or fail to improve, for anxiety.  Counseling provided for all of the vaccine components No orders of the defined types were placed in this encounter.   Fonda Levins, MD [facility] 11/18/2023, 9:48 AM

## 2023-11-25 ENCOUNTER — Telehealth: Admitting: Physician Assistant

## 2023-11-25 DIAGNOSIS — T3695XA Adverse effect of unspecified systemic antibiotic, initial encounter: Secondary | ICD-10-CM

## 2023-11-25 DIAGNOSIS — B379 Candidiasis, unspecified: Secondary | ICD-10-CM

## 2023-11-25 MED ORDER — FLUCONAZOLE 150 MG PO TABS: ORAL_TABLET | ORAL | 0 refills | Status: DC

## 2023-11-25 NOTE — Progress Notes (Signed)

## 2023-11-25 NOTE — Progress Notes (Signed)
 I have spent 5 minutes in review of e-visit questionnaire, review and updating patient chart, medical decision making and response to patient.   Piedad Climes, PA-C

## 2023-12-02 ENCOUNTER — Ambulatory Visit: Payer: Self-pay

## 2023-12-02 NOTE — Telephone Encounter (Signed)
 Appointment scheduled for tomorrow.

## 2023-12-02 NOTE — Telephone Encounter (Signed)
 FYI Only or Action Required?: Action required by provider: request for appointment.  Patient was last seen in primary care on 11/18/2023 by Amy Nichols, Amy LABOR, Amy Nichols.  Called Nurse Triage reporting Wrist Pain.  Symptoms began several weeks ago.  Interventions attempted: OTC medications: tylenol .  Symptoms are: unchanged.  Triage Disposition: See Physician Within 24 Hours  Patient/caregiver understands and will follow disposition?: YesCopied from CRM [id number]. Topic: Clinical - Red Word Triage >> Dec 02, 2023 11:23 AM Amy Nichols wrote: Red Word that prompted transfer to Nurse Triage: tentanitues in wrist. Was given steriods at [facility], still in a lot of pain. Reason for Disposition  Weakness (i.e., loss of strength) of new-onset in hand or fingers  (Exceptions: Not truly weak, hand feels weak because of pain; weakness present > 2 weeks)  Answer Assessment - Initial Assessment Questions 1. ONSET: When did the pain start?     2 weeks ago  2. LOCATION: Where is the pain located?     Left  3. PAIN: How bad is the pain? (Scale 1-10; or mild, moderate, severe)     8 4. WORK OR EXERCISE: Has there been any recent work or exercise that involved this part (i.e., hand or wrist) of the body?     [place]  5. CAUSE: What do you think is causing the pain?     [facility] stated it was tendonitis 6. AGGRAVATING FACTORS: What makes the pain worse? (e.g., using computer)     Using/movement 7. OTHER SYMPTOMS: Do you have any other symptoms? (e.g., fever, neck pain, numbness or tingling, rash, swelling)     Denies all.    Pt went to UC and was given steroid shot and pack of prednisone  with brace to wear. No improvement. No known injury. Pt randomly woke up to pain. Wrist is popping. Pt is taking Tylenol  for pain.  Protocols used: Wrist Pain-A-AH

## 2023-12-03 ENCOUNTER — Encounter: Payer: Self-pay | Admitting: Family Medicine

## 2023-12-03 ENCOUNTER — Ambulatory Visit: Admitting: Family Medicine

## 2023-12-03 VITALS — BP 128/83 | HR 98 | Ht 69.0 in | Wt 290.0 lb

## 2023-12-03 DIAGNOSIS — S63502A Unspecified sprain of left wrist, initial encounter: Secondary | ICD-10-CM

## 2023-12-03 DIAGNOSIS — J029 Acute pharyngitis, unspecified: Secondary | ICD-10-CM | POA: Diagnosis not present

## 2023-12-03 LAB — CULTURE, GROUP A STREP

## 2023-12-03 LAB — RAPID STREP SCREEN (MED CTR MEBANE ONLY): Strep Gp A Ag, IA W/Reflex: NEGATIVE

## 2023-12-03 NOTE — Progress Notes (Signed)
 BP 128/83   Pulse 98   Ht 5' 9 (1.753 m)   Wt 290 lb (131.5 kg)   SpO2 98%   BMI 42.83 kg/m    Subjective:   Patient ID: Amy Nichols, female    DOB: 1995/11/16, 28 y.o.   MRN: [id number]  HPI: Amy Nichols is a 28 y.o. female presenting on 12/03/2023 for Wrist Pain (Left/) and throat pain   HPI Sore throat Patient is coming in today complaining of sore throat that started just this morning.  She feels like her throat is on fire.  She denies any fevers or chills or bodyaches.  She has not used anything for her throat except drinking more fluids.  She denies any sick contacts that she knows of.  Left wrist pain Patient is coming in today for left wrist pain.  The pain has been bothering her over the past few weeks.  She denies any specific trauma or anything.  She thinks just slept funny with it.  She did see the urgent care and they gave her a brace and some steroid and a steroid shot this last week.  She has been wearing the brace most of the time during the daytime but not at night.  She says it still hurting it hurts on the posterior aspect of her wrist.  She denies any numbness or weakness.  Relevant past medical, surgical, family and social history reviewed and updated as indicated. Interim medical history since our last visit reviewed. Allergies and medications reviewed and updated.  Review of Systems  Constitutional:  Negative for chills and fever.  HENT:  Positive for sore throat. Negative for congestion, ear discharge, ear pain, postnasal drip, rhinorrhea, sinus pressure and sneezing.   Eyes:  Negative for pain, redness and visual disturbance.  Respiratory:  Negative for chest tightness and shortness of breath.   Cardiovascular:  Negative for chest pain and leg swelling.  Genitourinary:  Negative for difficulty urinating and dysuria.  Musculoskeletal:  Negative for back pain and gait problem.  Skin:  Negative for rash.  Neurological:  Negative for  light-headedness and headaches.  Psychiatric/Behavioral:  Negative for agitation and behavioral problems.   All other systems reviewed and are negative.   Per HPI unless specifically indicated above   Allergies as of 12/03/2023       Reactions   Tape Swelling   Clear tape, swells arms        Medication List        Accurate as of December 03, 2023  3:03 PM. If you have any questions, ask your nurse or doctor.          STOP taking these medications    fluconazole  150 MG tablet Commonly known as: DIFLUCAN  Stopped by: Fonda LABOR Adonis Yim       TAKE these medications    albuterol  108 (90 Base) MCG/ACT inhaler Commonly known as: VENTOLIN  HFA Inhale 2 puffs into the lungs every 6 (six) hours as needed for wheezing or shortness of breath.   azelastine  0.1 % nasal spray Commonly known as: ASTELIN  Place 1 spray into both nostrils 2 (two) times daily. Use in each nostril as directed   desvenlafaxine  50 MG 24 hr tablet Commonly known as: PRISTIQ  Take 1 tablet (50 mg total) by mouth daily.   fluticasone  50 MCG/ACT nasal spray Commonly known as: FLONASE  Place 2 sprays into both nostrils daily. What changed:  when to take this reasons to take this   ibuprofen  800  MG tablet Commonly known as: ADVIL  Take 1 tablet (800 mg total) by mouth every 8 (eight) hours as needed.   levonorgestrel  20 MCG/DAY Iud Commonly known as: MIRENA  1 each by Intrauterine route once.   omeprazole  40 MG capsule Commonly known as: PRILOSEC TAKE 1 CAPSULE (40 MG TOTAL) BY MOUTH 2 (TWO) TIMES DAILY BEFORE A MEAL.   sulfamethoxazole -trimethoprim  800-160 MG tablet Commonly known as: BACTRIM  DS Take 1 tablet by mouth 2 (two) times daily.   traZODone  100 MG tablet Commonly known as: DESYREL  Take 1 tablet (100 mg total) by mouth at bedtime.         Objective:   BP 128/83   Pulse 98   Ht 5' 9 (1.753 m)   Wt 290 lb (131.5 kg)   SpO2 98%   BMI 42.83 kg/m   Wt Readings from Last 3  Encounters:  12/03/23 290 lb (131.5 kg)  11/18/23 286 lb (129.7 kg)  10/21/23 289 lb (131.1 kg)    Physical Exam Vitals and nursing note reviewed.  Constitutional:      General: She is not in acute distress.    Appearance: She is well-developed. She is not diaphoretic.  Eyes:     Conjunctiva/sclera: Conjunctivae normal.  Cardiovascular:     Rate and Rhythm: Normal rate and regular rhythm.     Heart sounds: Normal heart sounds. No murmur heard. Pulmonary:     Effort: Pulmonary effort is normal. No respiratory distress.     Breath sounds: Normal breath sounds. No wheezing.  Musculoskeletal:        General: Normal range of motion.     Left wrist: Tenderness (Posterior wrist tenderness, pain with range of motion.  Slight inflammation) present. No bony tenderness. Normal range of motion.  Skin:    General: Skin is warm and dry.     Findings: No rash.  Neurological:     Mental Status: She is alert and oriented to person, place, and time.     Coordination: Coordination normal.  Psychiatric:        Behavior: Behavior normal.       Assessment & Plan:   Problem List Items Addressed This Visit   None Visit Diagnoses       Sprain of left wrist, initial encounter    -  Primary   Relevant Orders   Ambulatory referral to Physical Therapy     Sore throat       Relevant Orders   Rapid Strep Screen ([facility] ONLY)     Recommended continuing over-the-counter anti-inflammatories and will refer to therapy.  Strep was negative so recommended salt water gargles  Follow up plan: Return if symptoms worsen or fail to improve.  Counseling provided for all of the vaccine components Orders Placed This Encounter  Procedures   Rapid Strep Screen ([facility] ONLY)   Ambulatory referral to Physical Therapy    Fonda Levins, MD [facility] 12/03/2023, 3:03 PM

## 2023-12-05 ENCOUNTER — Telehealth

## 2023-12-05 DIAGNOSIS — R6889 Other general symptoms and signs: Secondary | ICD-10-CM

## 2023-12-05 DIAGNOSIS — J301 Allergic rhinitis due to pollen: Secondary | ICD-10-CM | POA: Diagnosis not present

## 2023-12-05 MED ORDER — BENZONATATE 100 MG PO CAPS: 100.0000 mg | ORAL_CAPSULE | Freq: Two times a day (BID) | ORAL | 0 refills | Status: DC | PRN

## 2023-12-05 MED ORDER — CETIRIZINE HCL 10 MG PO TABS: 10.0000 mg | ORAL_TABLET | Freq: Every day | ORAL | 0 refills | Status: AC

## 2023-12-05 MED ORDER — FLUTICASONE PROPIONATE 50 MCG/ACT NA SUSP: 2.0000 | Freq: Every day | NASAL | 0 refills | Status: DC | PRN

## 2023-12-05 NOTE — Progress Notes (Signed)

## 2023-12-05 NOTE — Progress Notes (Signed)
 E visit for Flu symptoms   We are sorry that you are not feeling well.  Here is how we plan to help! Based on what you have shared with me it looks like you may have flu-like symptoms that should be watched but do not seem to indicate anti-viral treatment.  Influenza or "the flu" is   an infection caused by a respiratory virus. The flu virus is highly contagious and persons who did not receive their yearly flu vaccination may "catch" the flu from close contact.  We have anti-viral medications to treat the viruses that cause this infection. They are not a "cure" and only shorten the course of the infection. These prescriptions are most effective when they are given within the first 2 days of "flu" symptoms. Antiviral medication is indicated if you have a high risk of complications from the flu. You should also consider an antiviral medication if you are in close contact with someone who is at risk. These medications can help patients avoid complications from the flu but have side effects that you should know. Possible side effects from Tamiflu  or oseltamivir  include nausea, vomiting, diarrhea, dizziness, headaches, eye redness, sleep problems or other respiratory symptoms.  You should not take Tamiflu  if you have an allergy to oseltamivir  or any to the ingredients in Tamiflu .  Based upon your symptoms and potential risk factors I have prescribed Tessalon  Perles 100 mg. You may take 1-2 capsules every 8 hours as needed for cough    You are to isolate at home until you have been fever-free for at least 24 hours without a fever-reducing medication, and symptoms have been steadily improving for 24 hours. At that time, you can end isolation but need to mask for an additional 5 days.  If you must be around other household members who do not have symptoms, you need to make sure that both you and the family members are masking consistently with a high-quality mask.  If you note any worsening of symptoms  despite treatment, please seek an in-person evaluation ASAP. If you note any significant shortness of breath or any chest pain, please seek ED evaluation. Please do not delay care!  Go to the nearest hospital ED for assessment if fever/cough/breathlessness are severe or illness seems like a threat to life.    The following symptoms may appear 2-14 days after exposure: Fever Cough Shortness of breath or difficulty breathing Chills Repeated shaking with chills Muscle pain Headache Sore throat New loss of taste or smell Fatigue Congestion or runny nose Nausea or vomiting Diarrhea  You can use medication such as I have prescribed Fluticasone  nasal spray 2 sprays in each nostril one time per dayasal spray 2 sprays in each nostril one time per day  For nasal congestion, you may use an oral decongestant such as Mucinex  D or if you have glaucoma or high blood pressure use plain Mucinex .  Saline nasal spray or nasal drops can help and can safely be used as often as needed for congestion.  For your congestion, I have prescribed Fluticasone  nasal spray one spray in each nostril twice a day  If you have a sore or scratchy throat, use a saltwater gargle-  to  teaspoon of salt dissolved in a 4-ounce to 8-ounce glass of warm water.  Gargle the solution for approximately 15-30 seconds and then spit.  It is important not to swallow the solution.  You can also use throat lozenges/cough drops and Chloraseptic spray to help with throat pain  or discomfort.  Warm or cold liquids can also be helpful in relieving throat pain.  For headache, pain or general discomfort, you can use Ibuprofen  or Tylenol  as directed.   Some authorities believe that zinc sprays or the use of Echinacea may shorten the course of your symptoms.  ANYONE WHO HAS FLU SYMPTOMS SHOULD:  Stay home. The flu is highly contagious and going out or to work exposes others!  Be sure to drink plenty of fluids. Water is fine as well as fruit  juices, sodas and electrolyte beverages. You may want to stay       away from caffeine or alcohol. If you are nauseated, try taking small sips of liquids. How do you know if you are getting enough fluid? Your urine should be a pale yellow or almost colorless.  Get rest.  Taking a steamy shower or using a humidifier may help nasal congestion and ease sore throat pain. Using a saline nasal spray works much the same way.  Cough drops, hard candies and sore throat lozenges may ease your cough.  Line up a caregiver. Have someone check on you regularly.  GET HELP RIGHT AWAY IF YOU HAVE EMERGENCY WARNING SIGNS - CALL 911 or proceed to your closest emergency facility if:  You develop worsening high fever. Trouble breathing Bluish lips or face Persistent pain or pressure in the chest New confusion Inability to wake or stay awake You cough up blood. Your symptoms become more severe Inability to hold down food or fluids  MAKE SURE YOU Understand these instructions. Will watch your condition. Will get help right away if you are not doing well or get worse.  Your e-visit answers were reviewed by a board certified advanced clinical practitioner to complete your personal care plan.  Depending on the condition, your plan could have included both over the counter or prescription medications. If there is a problem, please reply once you have received a response from your provider. Your safety is important to us .  If you have drug allergies, check your prescription carefully.   You can use MyChart to ask questions about today's visit, request a non-urgent call back, or ask for a work or school excuse for 24 hours related to this e-Visit. If it has been greater than 24 hours you will need to follow up with your provider or enter a new e-Visit to address those concerns. You will get an e-mail in the next two days asking about your experience.  I hope that your e-visit has been valuable and will speed up  your recovery. Thank you for using E-visits!

## 2023-12-05 NOTE — Progress Notes (Signed)
 I have spent 5 minutes in review of e-visit questionnaire, review and updating patient chart, medical decision making and response to patient.   Laure Kidney, PA-C

## 2023-12-16 ENCOUNTER — Telehealth: Admitting: Physician Assistant

## 2023-12-16 DIAGNOSIS — R3989 Other symptoms and signs involving the genitourinary system: Secondary | ICD-10-CM

## 2023-12-16 MED ORDER — CEPHALEXIN 500 MG PO CAPS: 500.0000 mg | ORAL_CAPSULE | Freq: Two times a day (BID) | ORAL | 0 refills | Status: AC

## 2023-12-16 NOTE — Progress Notes (Signed)

## 2023-12-16 NOTE — Progress Notes (Signed)
 I have spent 5 minutes in review of e-visit questionnaire, review and updating patient chart, medical decision making and response to patient.   Laure Kidney, PA-C

## 2023-12-19 ENCOUNTER — Telehealth: Admitting: Physician Assistant

## 2023-12-19 DIAGNOSIS — R3989 Other symptoms and signs involving the genitourinary system: Secondary | ICD-10-CM

## 2023-12-20 ENCOUNTER — Other Ambulatory Visit: Payer: Self-pay | Admitting: Family Medicine

## 2023-12-20 NOTE — Progress Notes (Signed)
  Because you are failing a treatment, I feel your condition warrants further evaluation and I recommend that you be seen in a face-to-face visit to have a urine culture obtained.   NOTE: There will be NO CHARGE for this E-Visit   If you are having a true medical emergency, please call 911.     For an urgent face to face visit, [facility] has multiple urgent care centers for your convenience.  Click the link below for the full list of locations and hours, walk-in wait times, appointment scheduling options and driving directions:  Urgent Care - [address], [place], [place], [place], [place], [place]  [place]     Your MyChart E-visit questionnaire answers were reviewed by a board certified advanced clinical practitioner to complete your personal care plan based on your specific symptoms.    Thank you for using e-Visits.      I have spent 5 minutes in review of e-visit questionnaire, review and updating patient chart, medical decision making and response to patient.   Amy CHRISTELLA Dickinson, PA-C

## 2023-12-21 ENCOUNTER — Ambulatory Visit: Admitting: General Surgery

## 2024-01-11 ENCOUNTER — Encounter: Payer: Self-pay | Admitting: General Surgery

## 2024-01-11 ENCOUNTER — Ambulatory Visit (INDEPENDENT_AMBULATORY_CARE_PROVIDER_SITE_OTHER): Admitting: General Surgery

## 2024-01-11 VITALS — BP 109/76 | HR 88 | Temp 98.1°F | Resp 14 | Ht 69.0 in | Wt 287.0 lb

## 2024-01-11 DIAGNOSIS — L0591 Pilonidal cyst without abscess: Secondary | ICD-10-CM | POA: Diagnosis not present

## 2024-01-11 NOTE — Progress Notes (Signed)
 Subjective:     Amy Nichols  Patient presents with a reopening of her pilonidal cyst.  Patient states has been draining yellow fluid and somewhat painful to touch.  No fevers have been noted.  No purulent drainage has been noted. Objective:    BP 109/76   Pulse 88   Temp 98.1 F (36.7 C) (Oral)   Resp 14   Ht 5' 9 (1.753 m)   Wt 287 lb (130.2 kg)   SpO2 96%   BMI 42.38 kg/m   General:  alert, cooperative, and no distress  Pinhole opening is noted along the upper portion of the incision.  Silver  nitrate was applied.  No induration or purulent drainage present.  No hairs present.     Assessment:    Granuloma at former pilonidal cyst excision site.    Plan:   Keep wound clean and dry.  Follow-up here in 3 weeks for wound check.

## 2024-01-17 ENCOUNTER — Other Ambulatory Visit: Payer: Self-pay | Admitting: Family Medicine

## 2024-01-19 ENCOUNTER — Telehealth: Payer: Self-pay | Admitting: *Deleted

## 2024-01-19 MED ORDER — SULFAMETHOXAZOLE-TRIMETHOPRIM 800-160 MG PO TABS: 1.0000 | ORAL_TABLET | Freq: Two times a day (BID) | ORAL | 0 refills | Status: AC

## 2024-01-19 NOTE — Telephone Encounter (Signed)
 Received call from patient [phone]~ telephone/ [phone]~ telephone.   Patient reports that she has new onset of foul smelling purulent drainage. Patient was recently seen on 01/11/2024. At OV small granuloma was noted and treated with silver  nitrate.   Patient reports that this drainage is new. Denies fever/ chills, N/V.   Patient is scheduled for follow up on 02/01/2024.  Provider made aware and new orders obtained.  Bactrim  DS 800/ 160 mg PO BID x 10 days Keep scheduled appointment.   Call placed to patient and patient made aware. Agreeable to plan. Prescription sent to pharmacy.

## 2024-02-01 ENCOUNTER — Encounter: Payer: Self-pay | Admitting: General Surgery

## 2024-02-01 ENCOUNTER — Ambulatory Visit (INDEPENDENT_AMBULATORY_CARE_PROVIDER_SITE_OTHER): Admitting: General Surgery

## 2024-02-01 VITALS — BP 126/89 | HR 83 | Temp 98.0°F | Resp 16 | Ht 69.0 in | Wt 284.0 lb

## 2024-02-01 DIAGNOSIS — L0591 Pilonidal cyst without abscess: Secondary | ICD-10-CM

## 2024-02-02 NOTE — Progress Notes (Signed)
 Subjective:     Amy Nichols  Patient presents for wound check.  She has had ongoing intermittent bloody drainage and tenderness at the pilonidal region.  No here is noted. Objective:    BP 126/89   Pulse 83   Temp 98 F (36.7 C) (Oral)   Resp 16   Ht 5' 9 (1.753 m)   Wt 284 lb (128.8 kg)   SpO2 98%   BMI 41.94 kg/m   General:  alert, cooperative, and no distress  Skin over the coccyx with a punctate wound present with granulomatous tissue and serosanguineous drainage noted.  No purulent drainage is noted.     Assessment:    Recurrent pilonidal cyst with granulomatous tissue present    Plan:   Patient is scheduled for excision of the pilonidal cyst on 02/16/2024.  The risks and benefits of the procedure including bleeding, infection, and recurrence of the cyst were fully explained to the patient, who gave informed consent.

## 2024-02-02 NOTE — H&P (Signed)
 Amy Nichols is a 28 y.o. female.   Chief Complaint: Recurrent pilonidal cyst HPI: Patient is a 28 year old black female status post excision of a pilonidal cyst in 2024 who presents with recurrent drainage with a wound along the previous incision.  Is difficult to determine whether this is a recurrence of the pilonidal cyst versus granulomatous tissue.  Despite conservative therapy, it has not resolved.  Past Medical History:  Diagnosis Date   Allergies    Anxiety    Bronchitis    GERD (gastroesophageal reflux disease)    Hypertension    no meds at this time    Past Surgical History:  Procedure Laterality Date   PILONIDAL CYST EXCISION N/A 01/07/2023   Procedure: CYST EXCISION PILONIDAL SIMPLE;  Surgeon: Mavis Anes, MD;  Location: [facility];  Service: General;  Laterality: N/A;   tear duct probe     TONSILLECTOMY AND ADENOIDECTOMY Bilateral 01/23/2020   Procedure: TONSILLECTOMY AND ADENOIDECTOMY;  Surgeon: Karis Clunes, MD;  Location: [facility];  Service: ENT;  Laterality: Bilateral;    Family History  Problem Relation Age of Onset   Asthma Mother    Hypertension Mother    Asthma Father    Hypertension Father    COPD Maternal Grandmother    COPD Maternal Grandfather    Hypertension Paternal Grandmother    Hypertension Paternal Grandfather    Social History:  reports that she has never smoked. She has never used smokeless tobacco. She reports that she does not drink alcohol and does not use drugs.  Allergies:  Allergies  Allergen Reactions   Tape Swelling    Clear tape, swells arms    No medications prior to admission.    No results found for this or any previous visit (from the past 48 hours). No results found.  Review of Systems  Constitutional: Negative.   HENT: Negative.    Eyes: Negative.   Respiratory: Negative.    Cardiovascular: Negative.   Gastrointestinal: Negative.   Endocrine: Negative.   Genitourinary: Negative.   Musculoskeletal:  Negative.   Allergic/Immunologic: Negative.   Neurological: Negative.   Hematological: Negative.   Psychiatric/Behavioral: Negative.      There were no vitals taken for this visit. Physical Exam Vitals reviewed.  Constitutional:      Appearance: Normal appearance. She is obese. She is not ill-appearing.  HENT:     Head: Normocephalic and atraumatic.  Cardiovascular:     Rate and Rhythm: Normal rate and regular rhythm.     Heart sounds: Normal heart sounds. No murmur heard.    No friction rub. No gallop.  Pulmonary:     Effort: Pulmonary effort is normal. No respiratory distress.     Breath sounds: Normal breath sounds. No stridor. No wheezing, rhonchi or rales.  Skin:    General: Skin is warm and dry.     Comments: Serosanguineous drainage from wound along previous incision for pilonidal cyst.  No purulent drainage noted.  Neurological:     Mental Status: She is alert and oriented to person, place, and time.      Assessment/Plan Impression: Recurrent pilonidal cyst with granulomatous tissue present Plan: Patient is scheduled for excision of the pilonidal cyst on 02/16/2024.  The risks and benefits of the procedure including bleeding, infection, and recurrence of the pilonidal cyst were fully explained to the patient, who gave informed consent.  Anes Mavis, MD 02/02/2024, 9:32 AM

## 2024-02-10 NOTE — Patient Instructions (Signed)
 Your procedure is scheduled on: 02/16/2024  Report to [facility] Main Entrance at   9:30  AM.  Call this number if you have problems the morning of surgery: [phone]   Remember:   Do not Eat or Drink after midnight         No Smoking the morning of surgery  :  Take these medicines the morning of surgery with A SIP OF WATER: omeprazole    Do not wear jewelry, make-up or nail polish.  Do not wear lotions, powders, or perfumes. You may wear deodorant.  Do not shave 48 hours prior to surgery. Men may shave face and neck.  Do not bring valuables to the hospital.  Contacts, dentures or bridgework may not be worn into surgery.  Leave suitcase in the car. After surgery it may be brought to your room.  For patients admitted to the hospital, checkout time is 11:00 AM the day of discharge.   Patients discharged the day of surgery will not be allowed to drive home.    Special Instructions: Shower using CHG night before surgery and shower the day of surgery use CHG.  Use special wash - you have one bottle of CHG for all showers.  You should use approximately 1/2 of the bottle for each shower. How to Use Chlorhexidine  at Home in the Shower Chlorhexidine  gluconate (CHG) is a germ-killing (antiseptic) wash that's used to clean the skin. It can get rid of the germs that normally live on the skin and can keep them away for about 24 hours. If you're having surgery, you may be told to shower with CHG at home the night before surgery. This can help lower your risk for infection. To use CHG wash in the shower, follow the steps below. Supplies needed: CHG body wash. Clean washcloth. Clean towel. How to use CHG in the shower Follow these steps unless you're told to use CHG in a different way: Start the shower. Use your normal soap and shampoo to wash your face and hair. Turn off the shower or move out of the shower stream. Pour CHG onto a clean washcloth. Do not use any type of brush or rough  sponge. Start at your neck, washing your body down to your toes. Make sure you: Wash the part of your body where the surgery will be done for at least 1 minute. Do not scrub. Do not use CHG on your head or face unless your health care provider tells you to. If it gets into your ears or eyes, rinse them well with water. Do not wash your genitals with CHG. Wash your back and under your arms. Make sure to wash skin folds. Let the CHG sit on your skin for 1-2 minutes or as long as told. Rinse your entire body in the shower, including all body creases and folds. Turn off the shower. Dry off with a clean towel. Do not put anything on your skin afterward, such as powder, lotion, or perfume. Put on clean clothes or pajamas. If it's the night before surgery, sleep in clean sheets. General tips Use CHG only as told, and follow the instructions on the label. Use the full amount of CHG as told. This is often one bottle. Do not smoke and stay away from flames after using CHG. Your skin may feel sticky after using CHG. This is normal. The sticky feeling will go away as the CHG dries. Do not use CHG: If you have a chlorhexidine  allergy or have reacted  to chlorhexidine  in the past. On open wounds or areas of skin that have broken skin, cuts, or scrapes. On babies younger than 68 months of age. Contact a health care provider if: You have questions about using CHG. Your skin gets irritated or itchy. You have a rash after using CHG. You swallow any CHG. Call your local poison control center [phone] in the [place] Your eyes itch badly, or they become very red or swollen. Your hearing changes. You have trouble seeing. If you can't reach your provider, go to an urgent care or emergency room. Do not drive yourself. Get help right away if: You have swelling or tingling in your mouth or throat. You make high-pitched whistling sounds when you breathe, most often when you breathe out (wheeze). You have  trouble breathing. These symptoms may be an emergency. Call 911 right away. Do not wait to see if the symptoms will go away. Do not drive yourself to the hospital. This information is not intended to replace advice given to you by your health care provider. Make sure you discuss any questions you have with your health care provider. Document Revised: 11/10/2022 Document Reviewed: 11/06/2021 Elsevier Patient Education  2024 [employer] Pilonidal Cyst Removal, Care After The following information offers guidance on how to care for yourself after your procedure. Your health care provider may also give you more specific instructions. If you have problems or questions, contact your health care provider. What can I expect after the procedure? After the procedure, it is common to have: Pain. Redness. Some swelling. Some fluid or blood coming from your incision (drainage). You may have more drainage if you have an open incision. Follow these instructions at home: Medicines Take over-the-counter and prescription medicines only as told by your health care provider. If you were prescribed antibiotics, take them as told by your health care provider. Do not stop using the antibiotic even if you start to feel better. Ask your health care provider if the medicine prescribed to you: Requires you to avoid driving or using machinery. Can cause constipation. You may need to take these actions to prevent or treat constipation: Drink enough fluid to keep your urine pale yellow. Take over-the-counter or prescription medicines. Eat foods that are high in fiber, such as beans, whole grains, and fresh fruits and vegetables. Limit foods that are high in fat and processed sugars, such as fried or sweet foods. Incision care  You may need to have a caregiver help you with wound care and dressing changes. Follow instructions from your health care provider about how to take care of your incision. Make sure  you: Wash your hands with soap and water for at least 20 seconds before and after you change your bandage (dressing). If soap and water are not available, use hand sanitizer. Change your dressing as told by your health care provider. Leave stitches (sutures), skin glue, or adhesive strips in place. These skin closures may need to stay in place for 2 weeks or longer. If adhesive strip edges start to loosen and curl up, you may trim the loose edges. Do not remove adhesive strips completely unless your health care provider tells you to do that. Check your incision area every day for signs of infection. If it is hard to see the area, have someone check for you. Check for: More redness, swelling, or pain. More fluid or blood. Warmth. Pus or a bad smell. Managing pain and swelling If directed, put ice on the affected area. To  do this: Put ice in a plastic bag. Place a towel between your skin and the bag. Leave the ice on for 20 minutes, 2-3 times a day. If your skin turns bright red, remove the ice right away to prevent skin damage. The risk of skin damage is higher if you cannot feel pain, heat, or cold. Activity Do not do activities that cause pain or irritate the incision area. These may include bike riding, running, sit ups, or anything that involves a twisting motion. Rest as told by your health care provider. Do not sit for a long time without moving. Get up to take short walks every 1-2 hours. This will improve blood flow and breathing. Ask for help if you feel weak or unsteady. Return to your normal activities as told by your health care provider. Ask your health care provider what activities are safe for you. General instructions Do not use any products that contain nicotine or tobacco. These products include cigarettes, chewing tobacco, and vaping devices, such as e-cigarettes. These can delay healing after surgery. If you need help quitting, ask your health care provider. Do not take  baths, swim, or use a hot tub until your health care provider approves. Ask your health care provider if you may take showers. You may only be allowed to take sponge baths. Keep all follow-up visits. This is important to monitor healing. If you had a procedure with wound packing, your packing may be changed or removed at follow-up visits. Contact a health care provider if: You have pain that does not get better with medicine. You have any of these signs of infection: More redness, swelling, or pain around your incision. More fluid or blood coming from your incision. Warmth coming from your incision. Pus or a bad smell coming from your incision. A fever. Get help right away if: You have severe pain in your abdomen. You have sudden chest pain and shortness of breath. You cough up blood. You faint or lose consciousness. These symptoms may be an emergency. Get help right away. Call 911. Do not wait to see if the symptoms will go away. Do not drive yourself to the hospital. Summary It is common to have some pain and drainage after your procedure. You may have more drainage if you have an open incision. You may need to have a caregiver help you with wound care and dressing changes. Do not do activities that cause pain or irritate the incision area. Contact your health care provider if you have pain that does not get better with medicine or if you have any signs of infection. This information is not intended to replace advice given to you by your health care provider. Make sure you discuss any questions you have with your health care provider. Document Revised: 08/01/2021 Document Reviewed: 08/01/2021 Elsevier Patient Education  2024 [employer]. General Anesthesia, Adult, Care After The following information offers guidance on how to care for yourself after your procedure. Your health care provider may also give you more specific instructions. If you have problems or questions, contact your  health care provider. What can I expect after the procedure? After the procedure, it is common for people to: Have pain or discomfort at the IV site. Have nausea or vomiting. Have a sore throat or hoarseness. Have trouble concentrating. Feel cold or chills. Feel weak, sleepy, or tired (fatigue). Have soreness and body aches. These can affect parts of the body that were not involved in surgery. Follow these instructions at home:  For the time period you were told by your health care provider:  Rest. Do not participate in activities where you could fall or become injured. Do not drive or use machinery. Do not drink alcohol. Do not take sleeping pills or medicines that cause drowsiness. Do not make important decisions or sign legal documents. Do not take care of children on your own. General instructions Drink enough fluid to keep your urine pale yellow. If you have sleep apnea, surgery and certain medicines can increase your risk for breathing problems. Follow instructions from your health care provider about wearing your sleep device: Anytime you are sleeping, including during daytime naps. While taking prescription pain medicines, sleeping medicines, or medicines that make you drowsy. Return to your normal activities as told by your health care provider. Ask your health care provider what activities are safe for you. Take over-the-counter and prescription medicines only as told by your health care provider. Do not use any products that contain nicotine or tobacco. These products include cigarettes, chewing tobacco, and vaping devices, such as e-cigarettes. These can delay incision healing after surgery. If you need help quitting, ask your health care provider. Contact a health care provider if: You have nausea or vomiting that does not get better with medicine. You vomit every time you eat or drink. You have pain that does not get better with medicine. You cannot urinate or have bloody  urine. You develop a skin rash. You have a fever. Get help right away if: You have trouble breathing. You have chest pain. You vomit blood. These symptoms may be an emergency. Get help right away. Call 911. Do not wait to see if the symptoms will go away. Do not drive yourself to the hospital. Summary After the procedure, it is common to have a sore throat, hoarseness, nausea, vomiting, or to feel weak, sleepy, or fatigue. For the time period you were told by your health care provider, do not drive or use machinery. Get help right away if you have difficulty breathing, have chest pain, or vomit blood. These symptoms may be an emergency. This information is not intended to replace advice given to you by your health care provider. Make sure you discuss any questions you have with your health care provider. Document Revised: 07/25/2021 Document Reviewed: 07/25/2021 Elsevier Patient Education  2024 [employer].

## 2024-02-14 ENCOUNTER — Encounter (HOSPITAL_COMMUNITY)
Admission: RE | Admit: 2024-02-14 | Discharge: 2024-02-14 | Disposition: A | Discharge status: Home / Self Care | Source: Ambulatory Visit | Attending: General Surgery | Admitting: General Surgery

## 2024-02-14 ENCOUNTER — Other Ambulatory Visit: Payer: Self-pay

## 2024-02-14 ENCOUNTER — Encounter (HOSPITAL_COMMUNITY): Payer: Self-pay

## 2024-02-14 DIAGNOSIS — I10 Essential (primary) hypertension: Secondary | ICD-10-CM | POA: Diagnosis not present

## 2024-02-14 DIAGNOSIS — Z01818 Encounter for other preprocedural examination: Secondary | ICD-10-CM | POA: Diagnosis not present

## 2024-02-14 DIAGNOSIS — Z01812 Encounter for preprocedural laboratory examination: Secondary | ICD-10-CM | POA: Diagnosis present

## 2024-02-14 DIAGNOSIS — Z01810 Encounter for preprocedural cardiovascular examination: Secondary | ICD-10-CM | POA: Diagnosis present

## 2024-02-14 LAB — PREGNANCY, URINE: Preg Test, Ur: NEGATIVE

## 2024-02-16 ENCOUNTER — Other Ambulatory Visit: Payer: Self-pay

## 2024-02-16 ENCOUNTER — Encounter (HOSPITAL_COMMUNITY): Payer: Self-pay | Admitting: General Surgery

## 2024-02-16 ENCOUNTER — Other Ambulatory Visit: Payer: Self-pay | Admitting: Family Medicine

## 2024-02-16 ENCOUNTER — Ambulatory Visit (HOSPITAL_COMMUNITY): Payer: Self-pay | Admitting: Anesthesiology

## 2024-02-16 ENCOUNTER — Encounter (HOSPITAL_COMMUNITY)
Admission: RE | Disposition: A | Discharge status: Home / Self Care | Payer: Self-pay | Source: Home / Self Care | Attending: General Surgery

## 2024-02-16 ENCOUNTER — Ambulatory Visit (HOSPITAL_BASED_OUTPATIENT_CLINIC_OR_DEPARTMENT_OTHER): Payer: Self-pay | Admitting: Anesthesiology

## 2024-02-16 ENCOUNTER — Ambulatory Visit (HOSPITAL_COMMUNITY)
Admission: RE | Admit: 2024-02-16 | Discharge: 2024-02-16 | Disposition: A | Discharge status: Home / Self Care | Attending: General Surgery | Admitting: General Surgery

## 2024-02-16 DIAGNOSIS — L0591 Pilonidal cyst without abscess: Secondary | ICD-10-CM

## 2024-02-16 DIAGNOSIS — Z01818 Encounter for other preprocedural examination: Secondary | ICD-10-CM

## 2024-02-16 HISTORY — PX: PILONIDAL CYST EXCISION: SHX744

## 2024-02-16 SURGERY — EXCISION, SIMPLE PILONIDAL CYST
Anesthesia: General | Site: Buttocks

## 2024-02-16 MED ORDER — LIDOCAINE HCL (PF) 1 % IJ SOLN
INTRAMUSCULAR | Status: DC | PRN
  Administered 2024-02-16: 10 mL

## 2024-02-16 MED ORDER — PROPOFOL 10 MG/ML IV BOLUS
INTRAVENOUS | Status: AC
  Filled 2024-02-16: qty 20

## 2024-02-16 MED ORDER — FENTANYL CITRATE PF 50 MCG/ML IJ SOSY: 25.0000 ug | PREFILLED_SYRINGE | INTRAMUSCULAR | Status: DC | PRN

## 2024-02-16 MED ORDER — LIDOCAINE 2% (20 MG/ML) 5 ML SYRINGE
INTRAMUSCULAR | Status: DC | PRN
  Administered 2024-02-16: 60 mg via INTRAVENOUS

## 2024-02-16 MED ORDER — CEFAZOLIN SODIUM-DEXTROSE 3-4 GM/150ML-% IV SOLN
3.0000 g | INTRAVENOUS | Status: AC
  Administered 2024-02-16: 3 g via INTRAVENOUS
  Filled 2024-02-16: qty 150

## 2024-02-16 MED ORDER — ONDANSETRON HCL 4 MG/2ML IJ SOLN
INTRAMUSCULAR | Status: DC | PRN
  Administered 2024-02-16: 4 mg via INTRAVENOUS

## 2024-02-16 MED ORDER — CHLORHEXIDINE GLUCONATE CLOTH 2 % EX PADS: 6.0000 | MEDICATED_PAD | Freq: Once | CUTANEOUS | Status: DC

## 2024-02-16 MED ORDER — OXYCODONE HCL 5 MG PO TABS: 5.0000 mg | ORAL_TABLET | ORAL | 0 refills | Status: DC | PRN

## 2024-02-16 MED ORDER — MIDAZOLAM HCL 2 MG/2ML IJ SOLN
INTRAMUSCULAR | Status: DC | PRN
Start: 2024-02-16 — End: 2024-02-16
  Administered 2024-02-16: 2 mg via INTRAVENOUS

## 2024-02-16 MED ORDER — ORAL CARE MOUTH RINSE
15.0000 mL | Freq: Once | OROMUCOSAL | Status: DC
Start: 2024-02-16 — End: 2024-02-16

## 2024-02-16 MED ORDER — LACTATED RINGERS IV SOLN
INTRAVENOUS | Status: DC
Start: 2024-02-16 — End: 2024-02-16

## 2024-02-16 MED ORDER — CHLORHEXIDINE GLUCONATE 0.12 % MT SOLN
15.0000 mL | Freq: Once | OROMUCOSAL | Status: DC
Start: 2024-02-16 — End: 2024-02-16

## 2024-02-16 MED ORDER — OXYCODONE HCL 5 MG/5ML PO SOLN: 5.0000 mg | Freq: Once | ORAL | Status: AC | PRN

## 2024-02-16 MED ORDER — FENTANYL CITRATE (PF) 100 MCG/2ML IJ SOLN
INTRAMUSCULAR | Status: AC
  Filled 2024-02-16: qty 2

## 2024-02-16 MED ORDER — POVIDONE-IODINE 10 % EX OINT
TOPICAL_OINTMENT | CUTANEOUS | Status: AC
  Filled 2024-02-16: qty 28.4

## 2024-02-16 MED ORDER — MIDAZOLAM HCL 2 MG/2ML IJ SOLN
INTRAMUSCULAR | Status: AC
  Filled 2024-02-16: qty 2

## 2024-02-16 MED ORDER — SODIUM CHLORIDE 0.9 % IR SOLN
Status: DC | PRN
  Administered 2024-02-16: 1000 mL

## 2024-02-16 MED ORDER — POVIDONE-IODINE 10 % OINT PACKET
TOPICAL_OINTMENT | CUTANEOUS | Status: DC | PRN
  Administered 2024-02-16: 1 via TOPICAL

## 2024-02-16 MED ORDER — LIDOCAINE HCL (PF) 1 % IJ SOLN
INTRAMUSCULAR | Status: AC
  Filled 2024-02-16: qty 30

## 2024-02-16 MED ORDER — KETOROLAC TROMETHAMINE 30 MG/ML IJ SOLN
30.0000 mg | Freq: Once | INTRAMUSCULAR | Status: AC
  Administered 2024-02-16: 30 mg via INTRAVENOUS
  Filled 2024-02-16: qty 1

## 2024-02-16 MED ORDER — OXYCODONE HCL 5 MG PO TABS
5.0000 mg | ORAL_TABLET | Freq: Once | ORAL | Status: AC | PRN
  Administered 2024-02-16: 5 mg via ORAL
  Filled 2024-02-16: qty 1

## 2024-02-16 MED ORDER — PROPOFOL 10 MG/ML IV BOLUS
INTRAVENOUS | Status: DC | PRN
Start: 2024-02-16 — End: 2024-02-16
  Administered 2024-02-16: 250 mg via INTRAVENOUS

## 2024-02-16 MED ORDER — CHLORHEXIDINE GLUCONATE 0.12 % MT SOLN
OROMUCOSAL | Status: AC
  Filled 2024-02-16: qty 15

## 2024-02-16 MED ORDER — FENTANYL CITRATE (PF) 100 MCG/2ML IJ SOLN
INTRAMUSCULAR | Status: DC | PRN
  Administered 2024-02-16 (×3): 50 ug via INTRAVENOUS

## 2024-02-16 MED ORDER — SCOPOLAMINE 1 MG/3DAYS TD PT72
1.0000 | MEDICATED_PATCH | Freq: Once | TRANSDERMAL | Status: DC
  Administered 2024-02-16: 1 mg via TRANSDERMAL
  Filled 2024-02-16: qty 1

## 2024-02-16 SURGICAL SUPPLY — 24 items
BLADE SURG SZ11 CARB STEEL (BLADE) ×1 IMPLANT
CANNULA VESSEL 3MM 2 BLNT TIP (CANNULA) ×1 IMPLANT
CLOTH BEACON ORANGE TIMEOUT ST (SAFETY) ×1 IMPLANT
COVER LIGHT HANDLE (MISCELLANEOUS) IMPLANT
ELECTRODE REM PT RTRN 9FT ADLT (ELECTROSURGICAL) ×1 IMPLANT
GAUZE SPONGE 2X2 STRL 8-PLY (GAUZE/BANDAGES/DRESSINGS) IMPLANT
GAUZE SPONGE 4X4 12PLY STRL (GAUZE/BANDAGES/DRESSINGS) ×2 IMPLANT
GLOVE BIOGEL PI IND STRL 7.0 (GLOVE) ×2 IMPLANT
GLOVE SURG SS PI 7.5 STRL IVOR (GLOVE) ×2 IMPLANT
GOWN STRL REUS W/TWL LRG LVL3 (GOWN DISPOSABLE) ×2 IMPLANT
KIT TURNOVER KIT A (KITS) ×1 IMPLANT
MANIFOLD NEPTUNE II (INSTRUMENTS) ×1 IMPLANT
NDL HYPO 25X1 1.5 SAFETY (NEEDLE) ×1 IMPLANT
NEEDLE HYPO 25X1 1.5 SAFETY (NEEDLE) ×1 IMPLANT
NS IRRIG 1000ML POUR BTL (IV SOLUTION) ×1 IMPLANT
PACK MINOR (CUSTOM PROCEDURE TRAY) ×1 IMPLANT
PAD ARMBOARD POSITIONER FOAM (MISCELLANEOUS) ×1 IMPLANT
POSITIONER HEAD 8X9X4 ADT (SOFTGOODS) ×1 IMPLANT
SET BASIN LINEN APH (SET/KITS/TRAYS/PACK) ×1 IMPLANT
SPONGE T-LAP 18X18 ~~LOC~~+RFID (SPONGE) ×1 IMPLANT
SUT PROLENE 3 0 PS 2 (SUTURE) IMPLANT
SYR 30ML LL (SYRINGE) ×1 IMPLANT
SYR CONTROL 10ML LL (SYRINGE) ×1 IMPLANT
TAPE CLOTH SOFT 2X10 (GAUZE/BANDAGES/DRESSINGS) IMPLANT

## 2024-02-16 NOTE — Op Note (Signed)
 Patient:  Amy Nichols  DOB:  09/17/1995  MRN:  [id number]   Preop Diagnosis: Pilonidal cyst  Postop Diagnosis: Same  Procedure: Excision of pilonidal cyst  Surgeon: Oneil Budge, MD  Anes: General Endotracheal  Indications: Patient is a 28 year old black female who presents with a recurrence of her pilonidal cyst along the superior aspect of her previous excision line.  The risks and benefits of the procedure were fully explained to the patient, who gave informed consent.  Procedure note: The patient was placed in the right lateral decubitus position after induction of general endotracheal anesthesia.  The pilonidal region was prepped and draped using the usual sterile technique with Betadine .  Surgical site confirmation was performed.  A 2 cm elliptical incision was made across the superior aspect of the previous incision to incorporate the recurrent granulomatous tissue.  This was taken down to the subcutaneous tissue.  This was excised without difficulty.  A bleeding was controlled using Bovie electrocautery.  1% Xylocaine  was instilled into the surrounding wound.  The incision was closed using 3-0 Prolene interrupted sutures.  Betadine  ointment and dry sterile dressing were applied.  All tape and needle counts were correct at the end of the procedure.  The patient was extubated in the operating room and transferred to PACU in stable condition.  Complications: None  EBL: Minimal  Specimen: Pilonidal cyst

## 2024-02-16 NOTE — Anesthesia Preprocedure Evaluation (Addendum)
 Anesthesia Evaluation  Patient identified by MRN, date of birth, ID band Patient awake    Reviewed: Allergy & Precautions, H&P , NPO status , Patient's Chart, lab work & pertinent test results  Airway Mallampati: II  TM Distance: >3 FB Neck ROM: Full    Dental no notable dental hx.    Pulmonary asthma , Current Smoker Mild asthma Rarely uses inhaler Vapes nicotine  occasionally; last used 48 hours ago   Pulmonary exam normal breath sounds clear to auscultation       Cardiovascular hypertension, Normal cardiovascular exam Rhythm:Regular Rate:Normal     Neuro/Psych   Anxiety     negative neurological ROS  negative psych ROS   GI/Hepatic negative GI ROS, Neg liver ROS,GERD  ,,  Endo/Other    Class 3 obesity  Renal/GU negative Renal ROS  negative genitourinary   Musculoskeletal negative musculoskeletal ROS (+)    Abdominal   Peds negative pediatric ROS (+)  Hematology negative hematology ROS (+)   Anesthesia Other Findings   Reproductive/Obstetrics negative OB ROS                              Anesthesia Physical Anesthesia Plan  ASA: 2  Anesthesia Plan: General   Post-op Pain Management:    Induction: Intravenous  PONV Risk Score and Plan: Scopolamine patch - Pre-op  Airway Management Planned: Oral ETT and LMA  Additional Equipment:   Intra-op Plan:   Post-operative Plan: Extubation in OR  Informed Consent: I have reviewed the patients History and Physical, chart, labs and discussed the procedure including the risks, benefits and alternatives for the proposed anesthesia with the patient or authorized representative who has indicated his/her understanding and acceptance.     Dental advisory given  Plan Discussed with: CRNA  Anesthesia Plan Comments:          Anesthesia Quick Evaluation

## 2024-02-16 NOTE — Transfer of Care (Signed)
 Immediate Anesthesia Transfer of Care Note  Patient: Amy Nichols  Procedure(s) Performed: EXCISION, SIMPLE PILONIDAL CYST (Buttocks)  Patient Location: PACU  Anesthesia Type:General  Level of Consciousness: drowsy  Airway & Oxygen Therapy: Patient Spontanous Breathing and Patient connected to face mask oxygen  Post-op Assessment: Report given to RN and Post -op Vital signs reviewed and stable  Post vital signs: Reviewed and stable  Last Vitals:  Vitals Value Taken Time  BP 119/52   Temp 97.6   Pulse 75 02/16/24 12:47  Resp 15 02/16/24 12:47  SpO2 91 % 02/16/24 12:47  Vitals shown include unfiled device data.  Last Pain:  Vitals:   02/16/24 1001  TempSrc: Oral  PainSc: 0-No pain      Patients Stated Pain Goal: 4 (02/16/24 1001)  Complications: No notable events documented.

## 2024-02-16 NOTE — Anesthesia Procedure Notes (Signed)
 Procedure Name: LMA Insertion Date/Time: 02/16/2024 12:08 PM  Performed by: Elaine Delon CROME, CRNAPre-anesthesia Checklist: Emergency Drugs available, Patient identified, Suction available and Patient being monitored Patient Re-evaluated:Patient Re-evaluated prior to induction Oxygen Delivery Method: Circle system utilized Preoxygenation: Pre-oxygenation with 100% oxygen Induction Type: IV induction LMA: LMA inserted LMA Size: 4.0 Number of attempts: 1 Placement Confirmation: positive ETCO2 and breath sounds checked- equal and bilateral Tube secured with: Tape Dental Injury: Teeth and Oropharynx as per pre-operative assessment  Comments: Placed by Med Student LOISE Blanch

## 2024-02-16 NOTE — Anesthesia Postprocedure Evaluation (Signed)
 Anesthesia Post Note  Patient: Amy Nichols  Procedure(s) Performed: EXCISION, SIMPLE PILONIDAL CYST (Buttocks)  Patient location during evaluation: PACU Anesthesia Type: General Level of consciousness: awake and alert Pain management: pain level controlled Vital Signs Assessment: post-procedure vital signs reviewed and stable Respiratory status: spontaneous breathing, nonlabored ventilation, respiratory function stable and patient connected to nasal cannula oxygen Cardiovascular status: blood pressure returned to baseline and stable Postop Assessment: no apparent nausea or vomiting Anesthetic complications: no   No notable events documented.   Last Vitals:  Vitals:   02/16/24 1315 02/16/24 1330  BP: 132/73 136/83  Pulse: 81 86  Resp: 20 18  Temp:    SpO2: 93% 96%    Last Pain:  Vitals:   02/16/24 1330  TempSrc:   PainSc: 7                  Andrea Limes

## 2024-02-16 NOTE — Interval H&P Note (Signed)
 History and Physical Interval Note:  02/16/2024 10:01 AM  [name]  has presented today for surgery, with the diagnosis of PILONIDAL CYST.  The various methods of treatment have been discussed with the patient and family. After consideration of risks, benefits and other options for treatment, the patient has consented to  Procedure(s): EXCISION, SIMPLE PILONIDAL CYST (N/A) as a surgical intervention.  The patient's history has been reviewed, patient examined, no change in status, stable for surgery.  I have reviewed the patient's chart and labs.  Questions were answered to the patient's satisfaction.     Amy Nichols

## 2024-02-17 LAB — SURGICAL PATHOLOGY

## 2024-02-18 ENCOUNTER — Ambulatory Visit: Admitting: Family Medicine

## 2024-02-18 ENCOUNTER — Ambulatory Visit: Payer: Self-pay | Admitting: *Deleted

## 2024-02-18 ENCOUNTER — Encounter: Payer: Self-pay | Admitting: Family Medicine

## 2024-02-18 VITALS — BP 129/81 | HR 77 | Ht 69.0 in | Wt 287.0 lb

## 2024-02-18 DIAGNOSIS — Z23 Encounter for immunization: Secondary | ICD-10-CM

## 2024-02-18 DIAGNOSIS — J40 Bronchitis, not specified as acute or chronic: Secondary | ICD-10-CM | POA: Diagnosis not present

## 2024-02-18 DIAGNOSIS — L732 Hidradenitis suppurativa: Secondary | ICD-10-CM

## 2024-02-18 DIAGNOSIS — F419 Anxiety disorder, unspecified: Secondary | ICD-10-CM

## 2024-02-18 MED ORDER — ALBUTEROL SULFATE HFA 108 (90 BASE) MCG/ACT IN AERS: 2.0000 | INHALATION_SPRAY | Freq: Four times a day (QID) | RESPIRATORY_TRACT | 0 refills | Status: DC | PRN

## 2024-02-18 MED ORDER — DESVENLAFAXINE SUCCINATE ER 100 MG PO TB24: 100.0000 mg | ORAL_TABLET | Freq: Every day | ORAL | 1 refills | Status: AC

## 2024-02-18 NOTE — Telephone Encounter (Signed)
 The Pristiq  and oxycodone  really should not have a lot of interaction, it is fine to take them together.  At most they might make her little more sleepy together but I do not think she be significant.

## 2024-02-18 NOTE — Telephone Encounter (Signed)
 Reason for Triage: patient had surgery 2 days ago and is asking is she can take the desvenlafaxine  (PRISTIQ ) 100 MG 24 hr tablet and Oxy together.  Call back # [phone]   Attempted to contact patient- no answer,unable to leave call back message VM not set up.

## 2024-02-18 NOTE — Telephone Encounter (Signed)
  FYI Only or Action Required?: Action required by provider: clinical question for provider.  Patient was last seen in primary care on 02/18/2024 by Dettinger, Fonda LABOR, MD.  Called Nurse Triage reporting medication question.  Symptoms began today.  Interventions attempted: Nothing.  Symptoms are: stable.  Triage Disposition: Call Pharmacist Within 24 Hours  Patient/caregiver understands and will follow disposition?: Yes  Reason for Triage: patient had surgery 2 days ago and is asking is she can take the desvenlafaxine  (PRISTIQ ) 100 MG 24 hr tablet and Oxy together.  Call back # [phone]   Reason for Disposition  [1] Caller has medicine question about med NOT prescribed by primary care doctor (or NP/PA) or specialist AND [2] triager unable to answer question (e.g., compatibility with other med, storage)  Answer Assessment - Initial Assessment Questions 1. NAME of MEDICINE: What medicine(s) are you calling about?     Effexor 100mg  and oxycodone  2. QUESTION: What is your question? (e.g., double dose of medicine, side effect)     Patient wants to confirm that it is safe to take these medications together 3. PRESCRIBER: Who prescribed the medicine? Reason: if prescribed by specialist, call should be referred to that group.     Dr. Lue and Dr. Mavis (oxycodone ) 4. SYMPTOMS: Do you have any symptoms? If Yes, ask: What symptoms are you having?  How bad are the symptoms (e.g., mild, moderate, severe)     Patient wants to stay on medication for anxiety 5. PREGNANCY:  Is there any chance that you are pregnant? When was your last menstrual period?     N/a  Protocols used: Medication Question Call-A-AH

## 2024-02-18 NOTE — Telephone Encounter (Signed)
Pt made aware and has no further concerns.

## 2024-02-18 NOTE — Progress Notes (Signed)
 BP 129/81   Pulse 77   Ht 5' 9 (1.753 m)   Wt 287 lb (130.2 kg)   SpO2 98%   BMI 42.38 kg/m    Subjective:   Patient ID: Amy Nichols, female    DOB: 07/22/1995, 28 y.o.   MRN: [id number]  HPI: Amy Nichols is a 28 y.o. female presenting on 02/18/2024 for Medical Management of Chronic Issues and Anxiety   Discussed the use of AI scribe software for clinical note transcription with the patient, who gave verbal consent to proceed.  History of Present Illness   Amy Nichols is a 28 year old female with asthma and anxiety who presents for a recheck and medication refills.  Respiratory symptoms - Asthma with increased congestion recently - Uses inhaler as needed but currently unable to locate inhaler and requests refill - Continues Zyrtec  for allergies and uses Flonase  as needed - Actively trying to reduce smoking and vaping, aware of negative impact on asthma  Psychiatric symptoms - Anxiety under re-evaluation - Started Pristiq , which is effective for anxiety - Experiences irritability throughout the day - Associated symptoms include constipation, sweating, and nausea (nausea attributed to constipation) - Increasing fiber intake with prunes and increased water consumption to address constipation  Cutaneous and soft tissue lesions - Recurring cysts, including pilonidal cysts and cystic acne on legs - History of surgical removal of a cyst - Requests referral to dermatology for further evaluation and management  Musculoskeletal symptoms - Back pain following cyst removal surgery, possibly related to sitting awkwardly post-operatively - Uses donut cushion to alleviate discomfort        02/18/2024    9:41 AM 12/03/2023    2:49 PM 11/18/2023    9:20 AM 10/21/2023    9:44 AM 11/16/2022    3:55 PM  Depression screen PHQ 2/9  Decreased Interest 0 0 0 0 0  Down, Depressed, Hopeless 0 0 0 0 0  PHQ - 2 Score 0 0 0 0 0  Altered sleeping 0  0 3 0  Tired, decreased  energy 0  0 0 0  Change in appetite 0  3 3 0  Feeling bad or failure about yourself  0  0 0 0  Trouble concentrating 0  0 0 0  Moving slowly or fidgety/restless 0  0 0 0  Suicidal thoughts 0  0 0 0  PHQ-9 Score 0  3 6 0  Difficult doing work/chores Not difficult at all  Not difficult at all Not difficult at all         02/18/2024    9:41 AM 11/18/2023    9:20 AM 10/21/2023    9:44 AM 11/16/2022    3:55 PM  GAD 7 : Generalized Anxiety Score  Nervous, Anxious, on Edge 0 0 3 3  Control/stop worrying 0 0 3 0  Worry too much - different things 0 0 3 3  Trouble relaxing 0 0 3 0  Restless 0 0 3 3  Easily annoyed or irritable 0 0 3 3  Afraid - awful might happen 0 0 0 0  Total GAD 7 Score 0 0 18 12  Anxiety Difficulty Not difficult at all Not difficult at all Very difficult Somewhat difficult         Relevant past medical, surgical, family and social history reviewed and updated as indicated. Interim medical history since our last visit reviewed. Allergies and medications reviewed and updated.  Review of Systems  Constitutional:  Negative  for chills and fever.  Eyes:  Negative for visual disturbance.  Respiratory:  Negative for chest tightness and shortness of breath.   Cardiovascular:  Negative for chest pain and leg swelling.  Musculoskeletal:  Negative for back pain and gait problem.  Skin:  Negative for rash.  Neurological:  Negative for dizziness, light-headedness and headaches.  Psychiatric/Behavioral:  Negative for agitation and behavioral problems.   All other systems reviewed and are negative.   Per HPI unless specifically indicated above   Allergies as of 02/18/2024       Reactions   Tape Swelling   Clear tape, swells arms        Medication List        Accurate as of February 18, 2024  9:53 AM. If you have any questions, ask your nurse or doctor.          STOP taking these medications    azelastine  0.1 % nasal spray Commonly known as:  ASTELIN  Stopped by: Fonda LABOR Azha Constantin   benzonatate  100 MG capsule Commonly known as: TESSALON  Stopped by: Fonda LABOR Kinsie Belford       TAKE these medications    albuterol  108 (90 Base) MCG/ACT inhaler Commonly known as: VENTOLIN  HFA Inhale 2 puffs into the lungs every 6 (six) hours as needed for wheezing or shortness of breath.   cetirizine  10 MG tablet Commonly known as: ZyrTEC  Allergy Take 1 tablet (10 mg total) by mouth daily for 14 days. What changed:  when to take this reasons to take this   desvenlafaxine  100 MG 24 hr tablet Commonly known as: PRISTIQ  Take 1 tablet (100 mg total) by mouth daily. What changed:  medication strength how much to take Changed by: Fonda LABOR Kennedi Lizardo   fluticasone  50 MCG/ACT nasal spray Commonly known as: FLONASE  Place 2 sprays into both nostrils daily as needed for allergies.   ibuprofen  800 MG tablet Commonly known as: ADVIL  Take 1 tablet (800 mg total) by mouth every 8 (eight) hours as needed.   levonorgestrel  20 MCG/DAY Iud Commonly known as: MIRENA  1 each by Intrauterine route once.   omeprazole  40 MG capsule Commonly known as: PRILOSEC TAKE 1 CAPSULE (40 MG TOTAL) BY MOUTH 2 (TWO) TIMES DAILY BEFORE A MEAL.   oxyCODONE  5 MG immediate release tablet Commonly known as: Roxicodone  Take 1 tablet (5 mg total) by mouth every 4 (four) hours as needed.   traZODone  100 MG tablet Commonly known as: DESYREL  Take 1 tablet (100 mg total) by mouth at bedtime. What changed:  when to take this reasons to take this         Objective:   BP 129/81   Pulse 77   Ht 5' 9 (1.753 m)   Wt 287 lb (130.2 kg)   SpO2 98%   BMI 42.38 kg/m   Wt Readings from Last 3 Encounters:  02/18/24 287 lb (130.2 kg)  02/16/24 280 lb (127 kg)  02/01/24 284 lb (128.8 kg)    Physical Exam Physical Exam   CHEST: Lungs clear to auscultation. CARDIOVASCULAR: Regular heart sounds.         Assessment & Plan:   Problem List Items Addressed This  Visit       Other   Anxiety - Primary   Relevant Medications   desvenlafaxine  (PRISTIQ ) 100 MG 24 hr tablet   Other Visit Diagnoses       Bronchitis       Relevant Medications   albuterol  (VENTOLIN  HFA) 108 (90 Base) MCG/ACT  inhaler     Hidradenitis       Relevant Orders   Ambulatory referral to Dermatology        Assessment and Plan    Asthma and Allergic Rhinitis Asthma and allergic rhinitis well-controlled with current medications. - Send refill for albuterol  inhaler.  Tobacco Use Continues smoking, reduced vaping. Smoking harmful to lung health, especially with asthma and allergies. - Advise cessation of smoking and reduction of vaping.  Anxiety Disorder Managed with Pristiq . Reports improvement but experiences irritability, constipation, sweating, and nausea. Current dose is lowest available. - Increase Pristiq  to 100 mg daily. - Follow up in 2-3 months to assess response.  Constipation Side effect of Pristiq . Managed with fiber, hydration, prunes, and probiotics. - Recommend MiraLAX  as needed.  Recurrent Cystic Skin Lesions and Postoperative State, Pilonidal Cyst Removal Recurrent cystic lesions possibly hidradenitis suppurativa. Post-surgery healing well. Low back pain likely due to altered posture. - Refer to dermatologist for evaluation of recurrent cystic lesions.  Low Back Pain Likely due to altered sitting posture post-surgery. - Advise use of donut cushion to alleviate back pain.          Follow up plan: Return if symptoms worsen or fail to improve, for 2 to 63-month anxiety recheck.  Counseling provided for all of the vaccine components Orders Placed This Encounter  Procedures   Ambulatory referral to Dermatology    Fonda Levins, MD [facility] 02/18/2024, 9:53 AM

## 2024-02-21 ENCOUNTER — Telehealth: Payer: Self-pay | Admitting: *Deleted

## 2024-02-21 MED ORDER — BACLOFEN 10 MG PO TABS: 10.0000 mg | ORAL_TABLET | Freq: Three times a day (TID) | ORAL | 0 refills | Status: AC

## 2024-02-21 NOTE — Telephone Encounter (Signed)
 Surgical Date: 02/16/2024 Procedure: Excision Pilonidal Cyst, Simple  Received call from patient [phone]~ telephone.   Patient reports that she has been having lower back pain, more so on the right side, above excision site. States that pain is around 6- 7 on pain scale. States that she has been using heating pad, Oxycodone , IBU with no relief. States that she feels no relief with repositioning either.   Please advise.

## 2024-02-21 NOTE — Telephone Encounter (Signed)
 Discussed with Dr. Kallie as Dr. Mavis is out of the office.   Recommended to continue heating pad, NSAID's and pain medications as needed. Advised to add muscle relaxer to current treatment. Avoid taking Baclofen  and Oxycodone  together due to increased sedative effects.   Patient agreeable and prescription sent to pharmacy.   Appointment scheduled for evaluation.

## 2024-02-23 ENCOUNTER — Encounter: Payer: Self-pay | Admitting: General Surgery

## 2024-02-23 ENCOUNTER — Ambulatory Visit: Admitting: General Surgery

## 2024-02-23 VITALS — BP 118/82 | HR 80 | Temp 98.0°F | Resp 16 | Ht 69.0 in | Wt 290.0 lb

## 2024-02-23 DIAGNOSIS — L0591 Pilonidal cyst without abscess: Secondary | ICD-10-CM

## 2024-02-23 DIAGNOSIS — T148XXA Other injury of unspecified body region, initial encounter: Secondary | ICD-10-CM

## 2024-02-23 MED ORDER — OXYCODONE HCL 5 MG PO TABS: 5.0000 mg | ORAL_TABLET | ORAL | 0 refills | Status: DC | PRN

## 2024-02-23 MED ORDER — AMOXICILLIN-POT CLAVULANATE 875-125 MG PO TABS: 1.0000 | ORAL_TABLET | Freq: Two times a day (BID) | ORAL | 0 refills | Status: AC

## 2024-02-23 NOTE — Patient Instructions (Addendum)
 Pack area daily with iodoform gauze. Cover with gauze and papertape. You can shower and repack after that. You can pack more than once a day if needed.  Take the antibiotics as prescribed to prevent any infection.

## 2024-02-23 NOTE — Progress Notes (Signed)
 [facility]  Patient with a lot of pain at the pilonidal site. Moving into her lower back. Says this is not like prior procedure.   BP 118/82   Pulse 80   Temp 98 F (36.7 C) (Oral)   Resp 16   Ht 5' 9 (1.753 m)   Wt 290 lb (131.5 kg)   SpO2 97%   BMI 42.83 kg/m  Pilonidal area with sutures, lower sutures pulled out, dehiscence of the wound with hematoma  Opened up and evacuated, packed with iodoform  Patient with partial wound dehiscence and hematoma. No active signs of infection but wound is open now and she has pain in the area so will do antibiotics to be safe.  Pack area daily with iodoform gauze. Cover with gauze and papertape. You can shower and repack after that. You can pack more than once a day if needed.  Take the antibiotics as prescribed to prevent any infection.  Refilled pain medication   Future Appointments  Date Time Provider Department Center  02/23/2024 10:45 AM Kallie Manuelita BROCKS, MD [facility] None  03/07/2024  1:15 PM Mavis Anes, MD [facility] None  05/15/2024  3:40 PM Dettinger, Fonda LABOR, MD [facility] [address]   Manuelita Kallie, MD [facility] [address] [place], [place] [address] [phone] (office)

## 2024-03-07 ENCOUNTER — Encounter: Payer: Self-pay | Admitting: General Surgery

## 2024-03-07 ENCOUNTER — Ambulatory Visit (INDEPENDENT_AMBULATORY_CARE_PROVIDER_SITE_OTHER): Admitting: General Surgery

## 2024-03-07 VITALS — BP 112/76 | HR 94 | Temp 97.9°F | Resp 12 | Ht 69.0 in | Wt 285.0 lb

## 2024-03-07 DIAGNOSIS — Z09 Encounter for follow-up examination after completed treatment for conditions other than malignant neoplasm: Secondary | ICD-10-CM

## 2024-03-07 NOTE — Progress Notes (Signed)
 Subjective:     Amy Nichols  Here for postoperative visit, status post reexcision of pilonidal cyst.  Patient was seen by Dr. Kallie for a partial wound dehiscence.  Patient has been packing the wound with iodoform.  She denies any fevers. Objective:    BP 112/76   Pulse 94   Temp 97.9 F (36.6 C) (Oral)   Resp 12   Ht 5' 9 (1.753 m)   Wt 285 lb (129.3 kg)   SpO2 98%   BMI 42.09 kg/m   General:  alert, cooperative, and no distress  Small wound dehiscence present.  Packing removed.  No purulent drainage noted.  Cleaned with Q-tip and peroxide.  Remaining suture removed.     Assessment:    Doing well postoperatively. Superficial wound dehiscence, healing well by secondary intention.    Plan:   Keep wound clean and dry daily with Q-tip and peroxide.  Follow-up here in 3 weeks for wound check.  Sooner as needed.

## 2024-03-13 ENCOUNTER — Encounter: Payer: Self-pay | Admitting: *Deleted

## 2024-03-13 ENCOUNTER — Telehealth: Payer: Self-pay | Admitting: Family Medicine

## 2024-03-13 ENCOUNTER — Other Ambulatory Visit: Payer: Self-pay | Admitting: *Deleted

## 2024-03-13 ENCOUNTER — Ambulatory Visit: Payer: Self-pay

## 2024-03-13 ENCOUNTER — Encounter: Payer: Self-pay | Admitting: Nurse Practitioner

## 2024-03-13 ENCOUNTER — Ambulatory Visit: Admitting: Nurse Practitioner

## 2024-03-13 VITALS — BP 128/85 | HR 95 | Temp 98.8°F | Ht 69.0 in | Wt 284.8 lb

## 2024-03-13 DIAGNOSIS — R42 Dizziness and giddiness: Secondary | ICD-10-CM

## 2024-03-13 MED ORDER — PREDNISONE 20 MG PO TABS: 40.0000 mg | ORAL_TABLET | Freq: Every day | ORAL | 0 refills | Status: AC

## 2024-03-13 MED ORDER — MECLIZINE HCL 25 MG PO TABS: 25.0000 mg | ORAL_TABLET | Freq: Three times a day (TID) | ORAL | 0 refills | Status: AC | PRN

## 2024-03-13 NOTE — Telephone Encounter (Signed)
 Copied from CRM [id number]. Topic: Clinical - Medical Advice >> Mar 13, 2024  8:47 AM Diannia H wrote: Reason for CRM: Patient is requesting a callback from a nurse because she is having some dizzy spells that's been going on about a week. Could you assist? Patients callback number is [phone]

## 2024-03-13 NOTE — Progress Notes (Signed)
   Subjective:    Patient ID: Amy Nichols, female    DOB: 1995-06-28, 28 y.o.   MRN: [id number]   Chief Complaint: Dizziness   Dizziness This is a new problem. The current episode started today. Associated symptoms include nausea and vertigo. Pertinent negatives include no visual change or vomiting. The symptoms are aggravated by standing. She has tried nothing for the symptoms.    Patient Active Problem List   Diagnosis Date Noted   Hematoma 02/23/2024   Anxiety 10/21/2023   Pilonidal cyst 01/07/2023   Hypertriglyceridemia 04/08/2018   GERD without esophagitis 07/23/2016   Essential hypertension 10/03/2015   Morbid obesity (HCC) 10/03/2015       Review of Systems  Gastrointestinal:  Positive for nausea. Negative for vomiting.  Neurological:  Positive for dizziness and vertigo.       Objective:   Physical Exam Constitutional:      Appearance: Normal appearance. She is obese.  Cardiovascular:     Rate and Rhythm: Normal rate and regular rhythm.     Heart sounds: Normal heart sounds.  Pulmonary:     Breath sounds: Normal breath sounds.  Skin:    General: Skin is warm.  Neurological:     General: No focal deficit present.     Mental Status: She is alert and oriented to person, place, and time.     Cranial Nerves: No cranial nerve deficit.     Sensory: No sensory deficit.  Psychiatric:        Mood and Affect: Mood normal.        Behavior: Behavior normal.     BP 128/85   Pulse 95   Temp 98.8 F (37.1 C)   Ht 5' 9 (1.753 m)   Wt 284 lb 12.8 oz (129.2 kg)   SpO2 98%   BMI 42.06 kg/m        Assessment & Plan:   Amy Nichols in today with chief complaint of Dizziness   1. Vertigo (Primary) Force fluids Meclizine sedation precautions. - predniSONE  (DELTASONE ) 20 MG tablet; Take 2 tablets (40 mg total) by mouth daily with breakfast for 5 days. 2 po daily for 5 days  Dispense: 10 tablet; Refill: 0 - meclizine (ANTIVERT) 25 MG tablet; Take 1  tablet (25 mg total) by mouth 3 (three) times daily as needed for dizziness.  Dispense: 30 tablet; Refill: 0    The above assessment and management plan was discussed with the patient. The patient verbalized understanding of and has agreed to the management plan. Patient is aware to call the clinic if symptoms persist or worsen. Patient is aware when to return to the clinic for a follow-up visit. Patient educated on when it is appropriate to go to the emergency department.   Mary-Margaret Gladis, FNP

## 2024-03-13 NOTE — Telephone Encounter (Signed)
 Patient had appt with dod today

## 2024-03-13 NOTE — Patient Instructions (Signed)
 Vertigo Vertigo is the feeling that you or the things around you are moving or spinning when they're not. It's different than feeling dizzy. It can also cause: Loss of balance. Trouble standing or walking. Nausea and vomiting. This feeling can come and go at any time. It can last from a few seconds to minutes or even hours. It may go away on its own or be treated with medicine. What are the types of vertigo? There are two types of vertigo: Peripheral vertigo happens when parts of your inner ear don't work like they should. This is the more common type. Central vertigo happens when your brain and spinal cord don't work like they should. Your health care provider will do tests to find out what kind of vertigo you have. This will help them decide on the right treatment for you. Follow these instructions at home: Eating and drinking Drink enough fluid to keep your pee (urine) pale yellow. Do not drink alcohol. Activity When you get up in the morning, first sit up on the side of the bed. When you feel okay, stand slowly while holding onto something. Move slowly. Avoid sudden body or head movements. Avoid certain positions, as told by your provider. Use a cane if you have trouble standing or walking. Sit down right away if you feel unsteady. Place items in your home so they're easy for you to reach without bending or leaning over. Return to normal activities when you're told. Ask what things are safe for you to do. General instructions Take your medicines only as told by your provider. Contact a health care provider if: Your medicines don't help or make your vertigo worse. You get new symptoms. You have a fever. You have nausea or vomiting. Your family or friends spot any changes in how you're acting. A part of your body goes numb. You feel tingling and prickling in a part of your body. You get very bad headaches. Get help right away if: You're always dizzy or you faint. You have a  stiff neck. You have trouble moving or speaking. Your hands, arms, or legs feel weak. Your hearing or eyesight changes. These symptoms may be an emergency. Call 911 right away. Do not wait to see if the symptoms will go away. Do not drive yourself to the hospital. This information is not intended to replace advice given to you by your health care provider. Make sure you discuss any questions you have with your health care provider. Document Revised: 01/28/2023 Document Reviewed: 07/31/2022 Elsevier Patient Education  2024 [employer].

## 2024-03-13 NOTE — Telephone Encounter (Signed)
 FYI Only or Action Required?: FYI only for provider: appointment scheduled on 03/13/2024.  Patient was last seen in primary care on 02/18/2024 by Dettinger, Fonda LABOR, MD.  Called Nurse Triage reporting Dizziness.  Symptoms began today.  Interventions attempted: Rest, hydration, or home remedies.  Symptoms are: unchanged.  Triage Disposition: See Physician Within 24 Hours  Patient/caregiver understands and will follow disposition?: Yes     Copied from CRM [id number]. Topic: Clinical - Red Word Triage >> Mar 13, 2024 10:36 AM Cherylann RAMAN wrote: Red Word that prompted transfer to Nurse Triage: Patient called in with complaints of severe dizziness upon waking up but has become moderate. Dizziness began today 11/03. This is the third time within a week that she has woken up with severe dizziness. Reason for Disposition  [1] MODERATE dizziness (e.g., vertigo; feels very unsteady, interferes with normal activities) AND [2] has NOT been evaluated by doctor (or NP/PA) for this  Answer Assessment - Initial Assessment Questions 1. DESCRIPTION: Describe your dizziness.     Feels like pressure on L side of head and she feels like her body is spinning 2. VERTIGO: Do you feel like either you or the room is spinning or tilting?      She feels like she is spinning  3. LIGHTHEADED: Do you feel lightheaded? (e.g., somewhat faint, woozy, weak upon standing)     Denies  4. SEVERITY: How bad is it?  Can you walk?     Moderate currently was severe this morning  5. ONSET:  When did the dizziness begin?     This morning upon waking up  6. AGGRAVATING FACTORS: Does anything make it worse? (e.g., standing, change in head position)     Change in position  7. CAUSE: What do you think is causing the dizziness?     Vertigo  8. RECURRENT SYMPTOM: Have you had dizziness before? If Yes, ask: When was the last time? What happened that time?     Yes, seems episodes are happening more frequently   9. OTHER SYMPTOMS: Do you have any other symptoms? (e.g., earache, headache, numbness, tinnitus, vomiting, weakness)  Mild headache   Drinking fluids for symptoms.  Protocols used: Dizziness - Vertigo-A-AH

## 2024-03-13 NOTE — Telephone Encounter (Signed)
 Appt made.

## 2024-03-15 ENCOUNTER — Encounter: Payer: Self-pay | Admitting: Family Medicine

## 2024-03-16 ENCOUNTER — Ambulatory Visit: Admitting: Family Medicine

## 2024-03-28 ENCOUNTER — Encounter: Admitting: General Surgery

## 2024-04-12 ENCOUNTER — Telehealth: Payer: Self-pay | Admitting: Family Medicine

## 2024-04-12 NOTE — Telephone Encounter (Signed)
 Referral updated and sent.

## 2024-04-12 NOTE — Telephone Encounter (Unsigned)
 Copied from CRM [id number]. Topic: Referral - Question >> Apr 12, 2024 12:49 PM Rosaria E wrote: Reason for CRM: Pt called requesting to have her referral for psychiatry transferred to the location in [place]  Address: [address], [address]

## 2024-05-15 ENCOUNTER — Encounter: Payer: Self-pay | Admitting: Family Medicine

## 2024-05-15 ENCOUNTER — Ambulatory Visit: Payer: Self-pay | Admitting: Family Medicine

## 2024-05-15 VITALS — BP 129/87 | HR 101 | Ht 69.0 in | Wt 287.0 lb

## 2024-05-15 DIAGNOSIS — N3281 Overactive bladder: Secondary | ICD-10-CM | POA: Diagnosis not present

## 2024-05-15 DIAGNOSIS — I10 Essential (primary) hypertension: Secondary | ICD-10-CM

## 2024-05-15 DIAGNOSIS — E781 Pure hyperglyceridemia: Secondary | ICD-10-CM

## 2024-05-15 DIAGNOSIS — F419 Anxiety disorder, unspecified: Secondary | ICD-10-CM | POA: Diagnosis not present

## 2024-05-15 MED ORDER — OXYBUTYNIN CHLORIDE ER 10 MG PO TB24: 10.0000 mg | ORAL_TABLET | Freq: Every day | ORAL | 1 refills | Status: AC

## 2024-05-15 NOTE — Patient Instructions (Signed)
 OAB Urgency: A sudden, compelling need to pee. Frequency: Needing to go often (more than 8 times in 24 hours). Nocturia: Waking up to pee at night. Urge Incontinence: Leaking urine after an urgent need to go.  Common Causes & Triggers Bladder Muscle Spasms: The bladder muscle contracts involuntarily.  Lifestyle: Caffeine, alcohol, spicy foods, carbonated drinks, and smoking can irritate the bladder. Diet: Dehydration can make symptoms worse; keeping track of food/drink helps.  Constipation: Hard stools can put pressure on the bladder. Pelvic Floor Issues: Weakness or trauma from childbirth/surgery. Neurological Conditions: MS, Parkinson's (though less common in young adults).  UTIs: Urinary Tract Infections can trigger OAB symptoms.   Management & Treatment Strategies Lifestyle Changes: Diet: Limit irritants (caffeine, alcohol, acidic foods).  Fluids: Drink plenty of water but not excessively; limit fluids before bed.  Weight: Maintain a healthy weight to reduce bladder pressure.  Quit Smoking: Smoking can worsen OAB.  Pelvic Floor Exercises (Kegels): Strengthen muscles for better control.  Bladder Training: Gradually increase time between bathroom visits.  Bladder Diary: Track intake, output, and urges to find triggers.

## 2024-05-15 NOTE — Progress Notes (Signed)
 "  BP 129/87   Pulse (!) 101   Ht 5' 9 (1.753 m)   Wt 287 lb (130.2 kg)   SpO2 98%   BMI 42.38 kg/m    Subjective:   Patient ID: Amy Nichols, female    DOB: Feb 25, 1996, 29 y.o.   MRN: [id number]  HPI: Amy Nichols is a 29 y.o. female presenting on 05/15/2024 for Medical Management of Chronic Issues, Anxiety (Pt states that she still becomes anxious), and Hypertension   Discussed the use of AI scribe software for clinical note transcription with the patient, who gave verbal consent to proceed.  History of Present Illness       Patient is coming in today for anxiety recheck.  She says she is currently taking the Pristiq  and she feels like she does not well during the day but she is still not sleeping the best at night.  She says she will typically go to bed at 9 or 10 and often she will wake up at 2 or 3 and then not be able to go back to sleep.  Sometimes she will sleep till 730 but then sometimes she wakes up.  She does not know if it is the anxiety keeping her up or if it is something else.  She does admit to having a lot of bladder issues and urgency and frequency and she is that most of her life.  She said she will get up 3-4 times a night to go.   Hypertension Patient is currently on no medicine currently, and their blood pressure today is 129/87. Patient denies any lightheadedness or dizziness. Patient denies headaches, blurred vision, chest pains, shortness of breath, or weakness. Denies any side effects from medication and is content with current medication.   Triglyceridemia Patient is coming in for recheck of his hypertriglyceridemia. The patient is currently taking no medicine currently, diet control. They deny any issues with myalgias or history of liver damage from it. They deny any focal numbness or weakness or chest pain.    Relevant past medical, surgical, family and social history reviewed and updated as indicated. Interim medical history since our last visit  reviewed. Allergies and medications reviewed and updated.  Review of Systems  Constitutional:  Negative for chills and fever.  Eyes:  Negative for redness and visual disturbance.  Respiratory:  Negative for chest tightness and shortness of breath.   Cardiovascular:  Negative for chest pain and leg swelling.  Gastrointestinal:  Negative for abdominal pain.  Genitourinary:  Positive for frequency and urgency. Negative for difficulty urinating, dysuria, genital sores and hematuria.  Musculoskeletal:  Negative for back pain and gait problem.  Skin:  Negative for rash.  Neurological:  Negative for dizziness, light-headedness and headaches.  Psychiatric/Behavioral:  Negative for agitation and behavioral problems.   All other systems reviewed and are negative.   Per HPI unless specifically indicated above   Allergies as of 05/15/2024       Reactions   Tape Swelling   Clear tape, swells arms        Medication List        Accurate as of May 15, 2024  4:03 PM. If you have any questions, ask your nurse or doctor.          albuterol  108 (90 Base) MCG/ACT inhaler Commonly known as: VENTOLIN  HFA Inhale 2 puffs into the lungs every 6 (six) hours as needed for wheezing or shortness of breath.   cetirizine  10 MG tablet Commonly  known as: ZyrTEC  Allergy Take 1 tablet (10 mg total) by mouth daily for 14 days. What changed:  when to take this reasons to take this   desvenlafaxine  100 MG 24 hr tablet Commonly known as: PRISTIQ  Take 1 tablet (100 mg total) by mouth daily.   fluticasone  50 MCG/ACT nasal spray Commonly known as: FLONASE  Place 2 sprays into both nostrils daily as needed for allergies.   ibuprofen  800 MG tablet Commonly known as: ADVIL  Take 1 tablet (800 mg total) by mouth every 8 (eight) hours as needed.   levonorgestrel  20 MCG/DAY Iud Commonly known as: MIRENA  1 each by Intrauterine route once.   meclizine  25 MG tablet Commonly known as: ANTIVERT  Take 1  tablet (25 mg total) by mouth 3 (three) times daily as needed for dizziness.   omeprazole  40 MG capsule Commonly known as: PRILOSEC TAKE 1 CAPSULE (40 MG TOTAL) BY MOUTH 2 (TWO) TIMES DAILY BEFORE A MEAL.   oxybutynin  10 MG 24 hr tablet Commonly known as: Ditropan  XL Take 1 tablet (10 mg total) by mouth at bedtime. Started by: Fonda Levins, MD   oxyCODONE  5 MG immediate release tablet Commonly known as: Roxicodone  Take 1 tablet (5 mg total) by mouth every 4 (four) hours as needed for severe pain (pain score 7-10) or breakthrough pain.   traZODone  100 MG tablet Commonly known as: DESYREL  Take 1 tablet (100 mg total) by mouth at bedtime. What changed:  when to take this reasons to take this         Objective:   BP 129/87   Pulse (!) 101   Ht 5' 9 (1.753 m)   Wt 287 lb (130.2 kg)   SpO2 98%   BMI 42.38 kg/m   Wt Readings from Last 3 Encounters:  05/15/24 287 lb (130.2 kg)  03/13/24 284 lb 12.8 oz (129.2 kg)  03/07/24 285 lb (129.3 kg)    Physical Exam Vitals and nursing note reviewed.  Constitutional:      General: She is not in acute distress.    Appearance: She is well-developed. She is not diaphoretic.  Eyes:     Conjunctiva/sclera: Conjunctivae normal.  Cardiovascular:     Rate and Rhythm: Normal rate and regular rhythm.     Heart sounds: Normal heart sounds. No murmur heard. Pulmonary:     Effort: Pulmonary effort is normal. No respiratory distress.     Breath sounds: Normal breath sounds. No wheezing.  Abdominal:     General: Abdomen is flat. Bowel sounds are normal. There is no distension.     Palpations: Abdomen is soft.     Tenderness: There is no abdominal tenderness. There is no guarding.  Musculoskeletal:        General: No swelling or tenderness. Normal range of motion.  Skin:    General: Skin is warm and dry.     Findings: No rash.  Neurological:     Mental Status: She is alert and oriented to person, place, and time.     Coordination:  Coordination normal.  Psychiatric:        Behavior: Behavior normal.                 Assessment & Plan:   Problem List Items Addressed This Visit       Cardiovascular and Mediastinum   Essential hypertension - Primary   Relevant Orders   CBC With Diff/Platelet   CMP14+EGFR   Lipid panel     Genitourinary   OAB (overactive  bladder)   Relevant Medications   oxybutynin  (DITROPAN  XL) 10 MG 24 hr tablet   Other Relevant Orders   CBC With Diff/Platelet   CMP14+EGFR   Lipid panel     Other   Hypertriglyceridemia   Relevant Orders   CBC With Diff/Platelet   CMP14+EGFR   Lipid panel   Anxiety   Relevant Orders   CBC With Diff/Platelet   CMP14+EGFR   Lipid panel           Patient likely has overactive bladder or bladder spasms, will try the oxybutynin .  Also give her a list of things to watch out for any dietary things to monitor and watch out for.  Will check hypertension and cholesterol through blood work today, will also check for diabetes because of urinary frequency.  Anxiety seems to be doing good except for insomnia     Follow up plan: Return in about 3 months (around 08/13/2024), or if symptoms worsen or fail to improve, for Anxiety and insomnia recheck.  Counseling provided for all of the vaccine components Orders Placed This Encounter  Procedures   CBC With Diff/Platelet   CMP14+EGFR   Lipid panel    Fonda Levins, MD [facility] 05/15/2024, 4:03 PM     "

## 2024-05-16 ENCOUNTER — Telehealth: Payer: Self-pay | Admitting: Family Medicine

## 2024-05-16 LAB — CMP14+EGFR
ALT: 30 IU/L (ref 0–32)
AST: 24 IU/L (ref 0–40)
Albumin: 4.3 g/dL (ref 4.0–5.0)
Alkaline Phosphatase: 67 IU/L (ref 41–116)
BUN/Creatinine Ratio: 9 (ref 9–23)
BUN: 7 mg/dL (ref 6–20)
Bilirubin Total: 0.5 mg/dL (ref 0.0–1.2)
CO2: 21 mmol/L (ref 20–29)
Calcium: 9.6 mg/dL (ref 8.7–10.2)
Chloride: 107 mmol/L — ABNORMAL HIGH (ref 96–106)
Creatinine, Ser: 0.75 mg/dL (ref 0.57–1.00)
Globulin, Total: 3.4 g/dL (ref 1.5–4.5)
Glucose: 99 mg/dL (ref 70–99)
Potassium: 3.7 mmol/L (ref 3.5–5.2)
Sodium: 141 mmol/L (ref 134–144)
Total Protein: 7.7 g/dL (ref 6.0–8.5)
eGFR: 110 mL/min/1.73

## 2024-05-16 LAB — LIPID PANEL
Chol/HDL Ratio: 3.8 ratio (ref 0.0–4.4)
Cholesterol, Total: 202 mg/dL — ABNORMAL HIGH (ref 100–199)
HDL: 53 mg/dL
LDL Chol Calc (NIH): 112 mg/dL — ABNORMAL HIGH (ref 0–99)
Triglycerides: 217 mg/dL — ABNORMAL HIGH (ref 0–149)
VLDL Cholesterol Cal: 37 mg/dL (ref 5–40)

## 2024-05-16 LAB — CBC WITH DIFF/PLATELET
Basophils Absolute: 0 x10E3/uL (ref 0.0–0.2)
Basos: 0 %
EOS (ABSOLUTE): 0 x10E3/uL (ref 0.0–0.4)
Eos: 1 %
Hematocrit: 42.2 % (ref 34.0–46.6)
Hemoglobin: 13.4 g/dL (ref 11.1–15.9)
Immature Grans (Abs): 0 x10E3/uL (ref 0.0–0.1)
Immature Granulocytes: 0 %
Lymphocytes Absolute: 2.1 x10E3/uL (ref 0.7–3.1)
Lymphs: 35 %
MCH: 28.8 pg (ref 26.6–33.0)
MCHC: 31.8 g/dL (ref 31.5–35.7)
MCV: 91 fL (ref 79–97)
Monocytes Absolute: 0.4 x10E3/uL (ref 0.1–0.9)
Monocytes: 7 %
Neutrophils Absolute: 3.3 x10E3/uL (ref 1.4–7.0)
Neutrophils: 57 %
Platelets: 178 x10E3/uL (ref 150–450)
RBC: 4.66 x10E6/uL (ref 3.77–5.28)
RDW: 13.3 % (ref 11.7–15.4)
WBC: 5.9 x10E3/uL (ref 3.4–10.8)

## 2024-05-16 NOTE — Telephone Encounter (Signed)
 We are not able to give out this information. [facility] should get this information directly from the pt. They can also see in their own system the date she had labs drawn with them.

## 2024-05-16 NOTE — Telephone Encounter (Signed)
 Copied from CRM #[id number]. Topic: General - Other >> May 16, 2024  3:29 PM Miquel SAILOR wrote: Reason for CRM:  Paw from [facility]/[phone]:Verifed dates 12/03/23 insurance name and memID#. Needs call back Invoice# [id number]

## 2024-05-22 ENCOUNTER — Ambulatory Visit: Payer: Self-pay | Admitting: Family Medicine

## 2024-05-31 NOTE — Progress Notes (Unsigned)
 " Psychiatric Initial Adult Assessment  Patient Identification: Amy Nichols MRN:  [id number] Date of Evaluation:  05/31/2024 Referral Source: Fonda Dettinger - [facility] for anxiety   Assessment:  Amy Nichols is a 29 y.o. female with a history of *** who presents to [facility] for initial evaluation of ***.  Patient reports ***  The risks/benefits/side-effects/alternatives to this medication were discussed in detail with the patient and time was given for questions. The patient consents to medication trial.   Risk Assessment: A suicide and violence risk assessment was performed as part of this evaluation. There patient is deemed to be at chronic elevated risk for self-harm/suicide given the following factors: {SABSUICIDERISKFACTORS:29780}. These risk factors are mitigated by the following factors: {SABSUICIDEPROTECTIVEFACTORS:29779}. The patient is deemed to be at chronic elevated risk for violence given the following factors: {SABVIOLENCERISKFACTORS:29781}. These risk factors are mitigated by the following factors: {SABVIOLENCEPROTECTIVEFACTORS:29782}. There is no *** acute risk for suicide or violence at this time. The patient was educated about relevant modifiable risk factors including following recommendations for treatment of psychiatric illness and abstaining from substance abuse.   While future psychiatric events cannot be accurately predicted, the patient does not *** currently require  acute inpatient psychiatric care and does not *** currently meet [place]  involuntary commitment criteria.    Plan:  # GAD Past medication trials:  Status of problem: *** Interventions: -- on pristiq  100mg  daily   # *** Past medication trials:  Status of problem: *** Interventions: -- ***  # *** Past medication trials:  Status of problem: *** Interventions: -- ***  Patient was given contact information for behavioral health clinic and  was instructed to call 911 for emergencies.   Return to care in ***  Patient was given contact information for behavioral health clinic and was instructed to call 911 for emergencies.    Patient and plan of care will be discussed with the Attending MD ,Dr. ***, who agrees with the above statement and plan.   Subjective:  Chief Complaint: medication management    History of Present Illness:  *** Labs: 05/2024 CBC wnl, CMP wnl, lipid panel with elevated LDL (112), cholesterol (202), TG (217) EKG: 02/2024 NSR Qtc 444  MRI brain: 08/2014 normal.  Sleep study: normal in 07/2014   Patient reports mood is *** Patient reports getting **** hours of sleep  Patient reports *** appetite Patient reports stressors include *** Patient reports ***adherence with medications. Patient reports *** side effects. Patient reports *** substance use Patient ***denies SI/HI/AVH.   Mood Symptoms Patient reports history of feeling depressed, anhedonia, guilt, changes in *** sleep, ***energy, appetite, and concentration. Patient reports *** suicidal ideation.   Manic Symptoms Patient reports history of elevated mood, increased energy, inflated self-esteem and grandiosity, decreased need for sleep, pressured speech, racing thoughts, distractibility, and impulsive and risky behavior as evidenced by ***  Persistent excessive energy or activity (>4-7d): denied*** Persistent expansive or irritable mood: denied*** Grandiosity: *** Decreased need of sleep (<3hr/night): denied***  Risky behaviors: ***  Pressured speech: *** Distractibility: *** Flight of ideas: *** Increased activity: ***  Anxiety Symptoms Patient reports history of excessive worry, history of panic attacks, feeling restless, having difficulty concentration.  ***  Trauma Symptoms Patient reports history of *** abuse. Patient reports negative alteration in cognition in mood as a result of the trauma, avoidance and increase in arousal and  reactivity, and intrusive symptoms including ***.   Psychosis Symptoms Patient reports a history of auditory  and visual hallucinations as evidenced by ***.   OCD Symptoms  Patient reports a history of obsessive thoughts and compulsive actions as evidenced by ***   ?A. Presence of obsessions, compulsions, or both: Obsessions as defined by both: 1. Recurrent and persistent thoughts, urges, or images that are experienced, at some time during the disturbance, as intrusive and unwanted, and that in most individuals cause marked anxiety or distress. 2. The individual attempts to ignore or suppress such thoughts, urges, or images, or to neutralize them with some other thought or action (ie, by performing a compulsion).  Compulsions as defined by both: 1. Repetitive behaviors (eg, hand washing, ordering, checking) or mental acts (eg, praying, counting, repeating words silently) that the individual feels driven to perform in response to an obsession, or according to rules that must be applied rigidly. 2. The behaviors or mental acts are aimed at preventing or reducing anxiety or distress or preventing some dreaded event or situation; however, these behaviors or mental acts either are not connected in a realistic way with what they are designed to neutralize or prevent or are clearly excessive.  ?B. The obsessions or compulsions are time-consuming (eg, take more than one hour per day) or cause clinically significant distress or impairment in social, occupational, or other important areas of functioning.  ADHD Symptoms:  A. A persistent pattern of inattention and/or hyperactivity-impulsivity that interferes with functioning or development, as characterized by (1) and/or (2): Note: The symptoms are not solely a manifestation of oppositional behavior, defiance, hostility, or failure to understand tasks or instructions. For older adolescents and adults (age 78 and older), at least five symptoms are required.  The patient often:  1. Inattention - Six (or more) of the following symptoms have persisted for at least six months to a degree that is inconsistent with developmental level and that negatively impacts directly on social and academic/occupational activities: -a. Fails to give close attention to details or makes careless mistakes in schoolwork, at work, or during other activities (eg, overlooks or misses details, work is inaccurate). -b. Has difficulty sustaining attention in tasks or play activities (eg, has difficulty remaining focused during lectures, conversations, or lengthy reading). -c. Does not seem to listen when spoken to directly (eg, mind seems elsewhere, even in the absence of any obvious distraction). -d. Does not follow through on instructions and fails to finish schoolwork, chores, or duties in the workplace (eg, starts tasks but quickly loses focus and is easily sidetracked). -e. Has difficulty organizing tasks and activities (eg, difficulty managing sequential tasks; difficulty keeping materials and belongings in order; messy, disorganized work; has poor time management; fails to meet deadlines). -f. Avoids, dislikes, or is reluctant to engage in tasks that require sustained mental effort (eg, schoolwork or homework; for older adolescents and adults, preparing reports, completing forms, reviewing lengthy papers). -g. Loses things necessary for tasks or activities (eg, school materials, pencils, books, tools, wallets, keys, paperwork, eyeglasses, mobile telephones). -h. Is easily distracted by extraneous stimuli (for older adolescents and adults, may include unrelated thoughts). -i. Is forgetful in daily activities (eg, doing chores, running errands; for older adolescents and adults, returning calls, paying bills, keeping appointments).  2. Hyperactivity and impulsivity - Six (or more) of the following symptoms have persisted for at least six months to a degree that is inconsistent  with developmental level and that negatively impacts directly on social and academic/occupational activities: -a. Often fidgets with or taps hands or feet or squirms in seat. -b. Often leaves seat  in situations when remaining seated is expected (eg, leaves his or her place in the classroom, in the office or other workplace, or in other situations that require remaining in place). -c. Often runs about or climbs in situations where it is inappropriate. (Note: In adolescents or adults, may be limited to feeling restless.) -d. Often unable to play or engage in leisure activities quietly. -e. Is often on the go, acting as if driven by a motor (eg, is unable to be or uncomfortable being still for extended time, as in restaurants, meetings: may be experienced by others as being restless or difficult to keep up with). -f. Often talks excessively. -g. Often blurts out an answer before a question has been completed (eg, completes people' sentences; cannot wait for turn in conversation). -h. Often has difficulty waiting his or her turn (eg, while waiting in line). -i. Often interrupts or intrudes on others (eg, butts into conversations, games, or activities; may start using other people's things without asking or receiving permission; for adolescents and adults, may intrude into or take over what others are doing).  ?B. Several inattentive or hyperactive-impulsive symptoms were present prior to age 68 years. ?C. Several inattentive or hyperactive-impulsive symptoms are present in two or more settings (eg, at home, school, or work; with friends or relatives; in other activities). ?D. There is clear evidence that the symptoms interfere with, or reduce the quality of, social, academic, or occupational functioning.   Past Psychiatric History:  Diagnoses: *** Medication trials: pristiq , wellbutrin  (more emotional), cymbalta  (increased blood pressure), zoloft  (ineffective), paxil  (anxious, fidget),  trazodone  Previous psychiatrist/therapist: *** Hospitalizations: *** Suicide attempts: *** SIB: *** Hx of violence towards others: *** Current access to guns: *** Hx of trauma/abuse: ***  Initial note    Substance Abuse History in the last 12 months:  {yes no:314532} UDS, PDMP (Frequency, quantity, last use, impact) Alcohol:  Tobacco: Cannabis: Other Illicit drugs: Rx drug abuse: Rehab hx:  Past Medical History:  Past Medical History:  Diagnosis Date   Allergies    Anxiety    Bronchitis    GERD (gastroesophageal reflux disease)    Hypertension    no meds at this time    Past Surgical History:  Procedure Laterality Date   PILONIDAL CYST EXCISION N/A 01/07/2023   Procedure: CYST EXCISION PILONIDAL SIMPLE;  Surgeon: Mavis Anes, MD;  Location: [facility];  Service: General;  Laterality: N/A;   PILONIDAL CYST EXCISION N/A 02/16/2024   Procedure: EXCISION, SIMPLE PILONIDAL CYST;  Surgeon: Mavis Anes, MD;  Location: [facility];  Service: General;  Laterality: N/A;   tear duct probe     TONSILLECTOMY AND ADENOIDECTOMY Bilateral 01/23/2020   Procedure: TONSILLECTOMY AND ADENOIDECTOMY;  Surgeon: Karis Clunes, MD;  Location: [facility];  Service: ENT;  Laterality: Bilateral;   PCP: Medical Dx: Medications: Allergies:  Hospitalizations: Surgeries: Trauma: Seizures: LMP: Contraceptives:  Family Psychiatric History: ***  Family History:  Family History  Problem Relation Age of Onset   Asthma Mother    Hypertension Mother    Asthma Father    Hypertension Father    COPD Maternal Grandmother    COPD Maternal Grandfather    Hypertension Paternal Grandmother    Hypertension Paternal Grandfather     Social History:   Academic/Vocational: *** Housing:  Income: Family: Support:  Children:  Marital Status Education:  Access to firearms: *** Medication stockpile: ***  Social History   Socioeconomic History   Marital status: Single    Spouse name: Not  on file   Number of children: 0   Years of education: 15   Highest education level: Associate degree: academic program  Occupational History   Occupation: college    Comment: athletics   Occupation: In home care  Tobacco Use   Smoking status: Never   Smokeless tobacco: Never  Vaping Use   Vaping status: Every Day  Substance and Sexual Activity   Alcohol use: No   Drug use: No   Sexual activity: Never    Birth control/protection: None  Other Topics Concern   Not on file  Social History Narrative   Lives with grandmother and grandfather (paternal)   Works    In college - criminal justice   Social Drivers of Health   Tobacco Use: Low Risk (05/15/2024)   Patient History    Smoking Tobacco Use: Never    Smokeless Tobacco Use: Never    Passive Exposure: Not on file  Financial Resource Strain: Low Risk (11/16/2022)   Overall Financial Resource Strain (CARDIA)    Difficulty of Paying Living Expenses: Not very hard  Food Insecurity: No Food Insecurity (11/16/2022)   Hunger Vital Sign    Worried About Running Out of Food in the Last Year: Never true    Ran Out of Food in the Last Year: Never true  Transportation Needs: No Transportation Needs (11/16/2022)   PRAPARE - Administrator, Civil Service (Medical): No    Lack of Transportation (Non-Medical): No  Physical Activity: Insufficiently Active (11/16/2022)   Exercise Vital Sign    Days of Exercise per Week: 3 days    Minutes of Exercise per Session: 30 min  Stress: Stress Concern Present (11/16/2022)   [employer] of Occupational Health - Occupational Stress Questionnaire    Feeling of Stress : Very much  Social Connections: Moderately Integrated (11/16/2022)   Social Connection and Isolation Panel    Frequency of Communication with Friends and Family: More than three times a week    Frequency of Social Gatherings with Friends and Family: Twice a week    Attends Religious Services: More than 4 times per year    Active  Member of Clubs or Organizations: No    Attends Banker Meetings: Not on file    Marital Status: Living with partner  Depression (PHQ2-9): Low Risk (05/15/2024)   Depression (PHQ2-9)    PHQ-2 Score: 2  Alcohol Screen: Low Risk (11/16/2022)   Alcohol Screen    Last Alcohol Screening Score (AUDIT): 1  Housing: Low Risk (11/16/2022)   Housing    Last Housing Risk Score: 0  Utilities: Not on file  Health Literacy: Not on file    Additional Social History: updated  Allergies:  Allergies[1]  Current Medications: Current Outpatient Medications  Medication Sig Dispense Refill   albuterol  (VENTOLIN  HFA) 108 (90 Base) MCG/ACT inhaler Inhale 2 puffs into the lungs every 6 (six) hours as needed for wheezing or shortness of breath. 8 g 0   cetirizine  (ZYRTEC  ALLERGY) 10 MG tablet Take 1 tablet (10 mg total) by mouth daily for 14 days. (Patient taking differently: Take 10 mg by mouth daily as needed for allergies.) 14 tablet 0   desvenlafaxine  (PRISTIQ ) 100 MG 24 hr tablet Take 1 tablet (100 mg total) by mouth daily. 90 tablet 1   fluticasone  (FLONASE ) 50 MCG/ACT nasal spray Place 2 sprays into both nostrils daily as needed for allergies. 11.1 mL 0   ibuprofen  (ADVIL ) 800 MG tablet Take 1 tablet (800  mg total) by mouth every 8 (eight) hours as needed. 30 tablet 1   levonorgestrel  (MIRENA ) 20 MCG/DAY IUD 1 each by Intrauterine route once.     meclizine  (ANTIVERT ) 25 MG tablet Take 1 tablet (25 mg total) by mouth 3 (three) times daily as needed for dizziness. 30 tablet 0   omeprazole  (PRILOSEC) 40 MG capsule TAKE 1 CAPSULE (40 MG TOTAL) BY MOUTH 2 (TWO) TIMES DAILY BEFORE A MEAL. 180 capsule 0   oxybutynin  (DITROPAN  XL) 10 MG 24 hr tablet Take 1 tablet (10 mg total) by mouth at bedtime. 90 tablet 1   oxyCODONE  (ROXICODONE ) 5 MG immediate release tablet Take 1 tablet (5 mg total) by mouth every 4 (four) hours as needed for severe pain (pain score 7-10) or breakthrough pain. 10 tablet 0    traZODone  (DESYREL ) 100 MG tablet Take 1 tablet (100 mg total) by mouth at bedtime. (Patient taking differently: Take 100 mg by mouth at bedtime as needed for sleep.) 90 tablet 1   No current facility-administered medications for this visit.    ROS: Review of Systems  Objective:  Psychiatric Specialty Exam: There were no vitals taken for this visit.There is no height or weight on file to calculate BMI.  General Appearance: {Appearance:22683}  Eye Contact:  {BHH EYE CONTACT:22684}  Speech:  {Speech:22685}  Volume:  {Volume (PAA):22686}  Mood:  {BHH MOOD:22306}  Affect:  {Affect (PAA):22687}  Thought Content: {Thought Content:22690}   Suicidal Thoughts:  {ST/HT (PAA):22692}  Homicidal Thoughts:  {ST/HT (PAA):22692}  Thought Process:  {Thought Process (PAA):22688}  Orientation:  {BHH ORIENTATION (PAA):22689}    Memory:  Grossly intact ***  Judgment:  {Judgement (PAA):22694}  Insight:  {Insight (PAA):22695}  Concentration:  {Concentration:21399}  Recall:  not formally assessed ***  Fund of Knowledge: {BHH GOOD/FAIR/POOR:22877}  Language: {BHH GOOD/FAIR/POOR:22877}  Psychomotor Activity:  {Psychomotor (PAA):22696}  Akathisia:  {BHH YES OR NO:22294}  AIMS (if indicated): {Desc; done/not:10129}  Assets:  {Assets (PAA):22698}  ADL's:  {BHH JIO'D:77709}  Cognition: {chl bhh cognition:304700322}  Sleep:  {BHH GOOD/FAIR/POOR:22877}   PE: General: well-appearing; no acute distress *** Pulm: no increased work of breathing on room air *** Strength & Muscle Tone: {desc; muscle tone:32375} Neuro: no focal neurological deficits observed *** Gait & Station: {PE GAIT ED NATL:22525}  Metabolic Disorder Labs: No results found for: HGBA1C, MPG No results found for: PROLACTIN Lab Results  Component Value Date   CHOL 202 (H) 05/15/2024   TRIG 217 (H) 05/15/2024   HDL 53 05/15/2024   CHOLHDL 3.8 05/15/2024   VLDL 45 (H) 07/23/2016   LDLCALC 112 (H) 05/15/2024   LDLCALC 100 (H)  05/15/2022   Lab Results  Component Value Date   TSH 1.330 05/15/2022    Therapeutic Level Labs: No results found for: LITHIUM No results found for: CBMZ No results found for: VALPROATE  Screenings:  GAD-7    Flowsheet Row Office Visit from 02/18/2024 in [facility] Office Visit from 11/18/2023 in [facility] Office Visit from 10/21/2023 in [facility] Office Visit from 11/16/2022 in [facility] Office Visit from 07/20/2022 in [facility]  Total GAD-7 Score 0 0 18 12 0   PHQ2-9    Flowsheet Row Office Visit from 05/15/2024 in [facility] Office Visit from 02/18/2024 in [facility] Office Visit from 12/03/2023 in [facility] Office Visit from 11/18/2023 in [facility] Office Visit from 10/21/2023 in [facility]  PHQ-2 Total Score 0 0 0 0 0  PHQ-9 Total Score 2 0 -- 3 6   Flowsheet Row Admission (Discharged) from 02/16/2024 in [facility] Admission (Discharged) from 01/07/2023 in [facility] Pre-Admission Testing 60 from 01/06/2023 in [facility]  C-SSRS RISK CATEGORY No Risk No Risk No Risk    Collaboration of Care: Collaboration of Care: {BH OP Collaboration of Care:21014065}  Patient/Guardian was advised Release of Information must be obtained prior to any record release in order to collaborate their care with an outside provider. Patient/Guardian was advised if they have not already done so to contact the registration department to sign all necessary forms in order for us  to release information regarding their care.   Consent: Patient/Guardian gives verbal consent for treatment and  assignment of benefits for services provided during this visit. Patient/Guardian expressed understanding and agreed to proceed.   Orville Mena, MD, PGY-3 1/21/202610:30 AM     [1]  Allergies Allergen Reactions   Tape Swelling    Clear tape, swells arms   "

## 2024-06-01 ENCOUNTER — Ambulatory Visit: Payer: Self-pay

## 2024-06-01 ENCOUNTER — Ambulatory Visit (INDEPENDENT_AMBULATORY_CARE_PROVIDER_SITE_OTHER): Admitting: Family Medicine

## 2024-06-01 ENCOUNTER — Other Ambulatory Visit: Payer: Self-pay | Admitting: Family Medicine

## 2024-06-01 ENCOUNTER — Encounter: Payer: Self-pay | Admitting: Family Medicine

## 2024-06-01 VITALS — BP 126/77 | HR 95 | Temp 97.8°F | Ht 69.0 in | Wt 282.6 lb

## 2024-06-01 DIAGNOSIS — J069 Acute upper respiratory infection, unspecified: Secondary | ICD-10-CM

## 2024-06-01 DIAGNOSIS — J4520 Mild intermittent asthma, uncomplicated: Secondary | ICD-10-CM | POA: Diagnosis not present

## 2024-06-01 DIAGNOSIS — R051 Acute cough: Secondary | ICD-10-CM | POA: Diagnosis not present

## 2024-06-01 DIAGNOSIS — J40 Bronchitis, not specified as acute or chronic: Secondary | ICD-10-CM

## 2024-06-01 DIAGNOSIS — J029 Acute pharyngitis, unspecified: Secondary | ICD-10-CM

## 2024-06-01 LAB — RAPID STREP SCREEN (MED CTR MEBANE ONLY): Strep Gp A Ag, IA W/Reflex: NEGATIVE

## 2024-06-01 LAB — VERITOR SARS-COV-2 AND FLU A+B
BD Veritor SARS-CoV-2 Ag: NEGATIVE
Influenza A: NEGATIVE
Influenza B: NEGATIVE

## 2024-06-01 LAB — CULTURE, GROUP A STREP

## 2024-06-01 MED ORDER — FLUTICASONE PROPIONATE 50 MCG/ACT NA SUSP: 2.0000 | Freq: Every day | NASAL | 0 refills | Status: AC | PRN

## 2024-06-01 NOTE — Telephone Encounter (Signed)
Noted  -LS

## 2024-06-01 NOTE — Progress Notes (Signed)
 "  Acute Office Visit  Subjective:     Patient ID: Amy Nichols, female    DOB: 12/15/1995, 29 y.o.   MRN: [id number]  Chief Complaint  Patient presents with   Cough    Cough    History of Present Illness   Amy Nichols is a 29 year old female with asthma who presents with ear pain, cough, and sore throat.  Otolaryngologic symptoms - Bilateral ear pain for two days - Auditory sensations of 'bubbling' or 'popping' in both ears - Sore throat for two days - Congestion present - No fever, headaches, nausea, vomiting, or diarrhea  Respiratory symptoms - Cough for two days - History of asthma - Used rescue inhaler once last night due to difficulty breathing while lying down - No shortness of breath or wheezing at other times  Medication use and response - Tried Mucinex  and Chloracetin without symptom relief - Not taking allergy medications regularly - Cannot take Sudafed due to hypertensive response (blood pressure up to 170-180 mmHg with 'spotted vision')    Requesting strep test today. Hx of tonsillectomy.       Review of Systems  Respiratory:  Positive for cough.    As per HPI.      Objective:    BP 126/77   Pulse 95   Temp 97.8 F (36.6 C) (Temporal)   Ht 5' 9 (1.753 m)   Wt 282 lb 9.6 oz (128.2 kg)   SpO2 98%   BMI 41.73 kg/m    Physical Exam Vitals and nursing note reviewed.  Constitutional:      General: She is not in acute distress.    Appearance: She is not ill-appearing, toxic-appearing or diaphoretic.  HENT:     Right Ear: External ear normal. A middle ear effusion is present. Tympanic membrane is not erythematous, retracted or bulging.     Left Ear: External ear normal. A middle ear effusion is present. Tympanic membrane is not erythematous, retracted or bulging.     Nose: Congestion present.     Right Sinus: No maxillary sinus tenderness or frontal sinus tenderness.     Left Sinus: No maxillary sinus tenderness or frontal sinus  tenderness.     Mouth/Throat:     Pharynx: Posterior oropharyngeal erythema present.     Comments: Tonsils surgically absent Eyes:     General:        Right eye: No discharge.        Left eye: No discharge.  Cardiovascular:     Rate and Rhythm: Normal rate and regular rhythm.     Heart sounds: Normal heart sounds. No murmur heard. Pulmonary:     Effort: Pulmonary effort is normal. No respiratory distress.     Breath sounds: Normal breath sounds. No wheezing, rhonchi or rales.  Musculoskeletal:     Cervical back: Neck supple.     Right lower leg: No edema.     Left lower leg: No edema.  Lymphadenopathy:     Cervical: No cervical adenopathy.  Skin:    General: Skin is warm and dry.  Neurological:     General: No focal deficit present.     Mental Status: She is alert and oriented to person, place, and time.  Psychiatric:        Mood and Affect: Mood normal.        Behavior: Behavior normal.     No results found for any visits on 06/01/24.      Assessment &  Plan:   Amy Nichols was seen today for cough.  Diagnoses and all orders for this visit:  Viral URI with cough -     fluticasone  (FLONASE ) 50 MCG/ACT nasal spray; Place 2 sprays into both nostrils daily as needed for allergies.  Acute cough -     Veritor SARS-CoV-2 and Flu A+B  Sore throat -     Veritor SARS-CoV-2 and Flu A+B -     Rapid Strep Screen ([facility] ONLY)  Mild intermittent asthma without complication   Assessment and Plan    Acute upper respiratory infection Negative Covid and flu today. Negative strep. Discussed viral etiology.  - Continue OTC medications as needed. - Increase hydration to thin mucus. - Use Flonase  nasal spray twice daily for two weeks. - Use antihistamines.  Asthma Potential for asthma exacerbation due to viral illness. - Monitor asthma symptoms and use rescue inhaler as needed. - Report frequent use of rescue inhaler or wheezing. - Consider prednisone  if asthma  exacerbation occurs.      Return to office for new or worsening symptoms, or if symptoms persist.   The patient indicates understanding of these issues and agrees with the plan.   Amy CHRISTELLA Search, FNP   "

## 2024-06-01 NOTE — Telephone Encounter (Signed)
 FYI Only or Action Required?: Action required by provider: request for appointment.  Patient was last seen in primary care on 05/15/2024 by Dettinger, Fonda LABOR, MD.  Called Nurse Triage reporting Cough.  Symptoms began several days ago.  Interventions attempted: Rest, hydration, or home remedies.  Symptoms are: gradually worsening.Productive cough with green mucus. Sore throat, ear pain, fatigue, wheezing.  Triage Disposition: See HCP Within 4 Hours (Or PCP Triage)  Patient/caregiver understands and will follow disposition?: Yes      Reason for Triage: sore throat, ears hurting, cough ( deep/wet ) mucus is thick    Reason for Disposition  [1] MILD difficulty breathing (e.g., minimal/no SOB at rest, SOB with walking, pulse < 100) AND [2] still present when not coughing  Answer Assessment - Initial Assessment Questions 1. ONSET: When did the cough begin?      2 days 2. SEVERITY: How bad is the cough today?      severe 3. SPUTUM: Describe the color of your sputum (e.g., none, dry cough; clear, white, yellow, green)     green 4. HEMOPTYSIS: Are you coughing up any blood? If Yes, ask: How much? (e.g., flecks, streaks, tablespoons, etc.)     no 5. DIFFICULTY BREATHING: Are you having difficulty breathing? If Yes, ask: How bad is it? (e.g., mild, moderate, severe)      mild 6. FEVER: Do you have a fever? If Yes, ask: What is your temperature, how was it measured, and when did it start?     no 7. CARDIAC HISTORY: Do you have any history of heart disease? (e.g., heart attack, congestive heart failure)      no 8. LUNG HISTORY: Do you have any history of lung disease?  (e.g., pulmonary embolus, asthma, emphysema)     asthma 9. PE RISK FACTORS: Do you have a history of blood clots? (or: recent major surgery, recent prolonged travel, bedridden)     no 10. OTHER SYMPTOMS: Do you have any other symptoms? (e.g., runny nose, wheezing, chest pain)       Ear pain,  sore throat, fatigue 11. PREGNANCY: Is there any chance you are pregnant? When was your last menstrual period?       no 12. TRAVEL: Have you traveled out of the country in the last month? (e.g., travel history, exposures)       no  Protocols used: Cough - Acute Productive-A-AH

## 2024-06-02 ENCOUNTER — Encounter

## 2024-06-03 ENCOUNTER — Other Ambulatory Visit: Payer: Self-pay | Admitting: Family Medicine

## 2024-06-06 ENCOUNTER — Ambulatory Visit: Payer: Self-pay

## 2024-06-06 NOTE — Telephone Encounter (Signed)
 Apt scheduled.

## 2024-06-06 NOTE — Telephone Encounter (Signed)
 Neg flu/COV/strep on 1/22 Since reporting bilat earache, ear popping Is using flonase , theraflu, cold med, mucinex  - no improvement   FYI Only or Action Required?: FYI only for provider: appointment scheduled on 1/28.  Patient was last seen in primary care on 06/01/2024 by Joesph Annabella HERO, FNP.  Called Nurse Triage reporting Otalgia.  Symptoms began a week ago.  Interventions attempted: OTC medications: flonase , theraflu, mucinex .  Symptoms are: gradually worsening.  Triage Disposition: See PCP When Office is Open (Within 3 Days)  Patient/caregiver understands and will follow disposition?: Yes    Reason for Triage: Worsening symptoms, ears popping, congestion, tested negative has taking otc medication and nose spray - feeling worse, hurting really bad    Reason for Disposition  [1] Ear congestion lasts > 3 days AND [2] no improvement after using Care Advice  (Exception: Ear congestion is a chronic symptom.)  Answer Assessment - Initial Assessment Questions 1. LOCATION: Which ear is involved?       Bilat   2. SENSATION: Describe how the ear feels. (e.g., stuffy, full, plugged).      Like they're about to bust, they're popping, like you have water in ears   3. ONSET:  When did the ear symptoms start?       Last Thursday  4. PAIN: Do you also have an earache? If Yes, ask: How bad is it? (Scale 0-10; none, mild, moderate or severe)    8/10  5. CAUSE: What do you think is causing the ear congestion? (e.g., common cold, nasal allergies, recent flight, recent snorkeling)     Recent URI   6. OTHER SYMPTOMS: Do you have any other symptoms? (e.g., ear drainage, hay fever symptoms such as sneezing or a clear nasal discharge; cold symptoms such as a cough or runny nose) Green mucus  Protocols used: Ear - Congestion-A-AH

## 2024-06-07 ENCOUNTER — Ambulatory Visit: Admitting: Family Medicine

## 2024-06-07 ENCOUNTER — Ambulatory Visit (HOSPITAL_COMMUNITY): Admitting: Psychiatry

## 2024-06-21 ENCOUNTER — Ambulatory Visit (HOSPITAL_COMMUNITY)

## 2024-08-14 ENCOUNTER — Ambulatory Visit: Admitting: Family Medicine

## 2024-10-09 ENCOUNTER — Ambulatory Visit: Admitting: Physician Assistant
